# Patient Record
Sex: Female | Born: 1950 | Race: White | Hispanic: No | Marital: Married | Smoking: Never smoker
Health system: Southern US, Community
[De-identification: ages and names within clinical notes are randomized; demographics above are authoritative.]

## PROBLEM LIST (undated history)

## (undated) DIAGNOSIS — M810 Age-related osteoporosis without current pathological fracture: Secondary | ICD-10-CM

## (undated) DIAGNOSIS — R112 Nausea with vomiting, unspecified: Secondary | ICD-10-CM

## (undated) DIAGNOSIS — N6019 Diffuse cystic mastopathy of unspecified breast: Secondary | ICD-10-CM

## (undated) DIAGNOSIS — E785 Hyperlipidemia, unspecified: Secondary | ICD-10-CM

## (undated) DIAGNOSIS — C50919 Malignant neoplasm of unspecified site of unspecified female breast: Secondary | ICD-10-CM

## (undated) DIAGNOSIS — Z9889 Other specified postprocedural states: Secondary | ICD-10-CM

## (undated) DIAGNOSIS — Z923 Personal history of irradiation: Secondary | ICD-10-CM

## (undated) HISTORY — PX: DILATION AND CURETTAGE OF UTERUS: SHX78

## (undated) HISTORY — DX: Diffuse cystic mastopathy of unspecified breast: N60.19

## (undated) HISTORY — PX: COLONOSCOPY: SHX174

## (undated) HISTORY — DX: Hyperlipidemia, unspecified: E78.5

## (undated) HISTORY — DX: Age-related osteoporosis without current pathological fracture: M81.0

---

## 1978-01-29 HISTORY — PX: BREAST CYST ASPIRATION: SHX578

## 1978-01-29 HISTORY — PX: BREAST LUMPECTOMY: SHX2

## 1983-01-30 HISTORY — PX: BREAST EXCISIONAL BIOPSY: SUR124

## 1992-01-30 HISTORY — PX: TUBAL LIGATION: SHX77

## 1997-12-15 ENCOUNTER — Other Ambulatory Visit: Admission: RE | Admit: 1997-12-15 | Discharge: 1997-12-15 | Payer: Self-pay | Admitting: Obstetrics and Gynecology

## 1998-03-02 ENCOUNTER — Other Ambulatory Visit: Admission: RE | Admit: 1998-03-02 | Discharge: 1998-03-02 | Payer: Self-pay | Admitting: Obstetrics and Gynecology

## 1999-02-20 ENCOUNTER — Encounter: Admission: RE | Admit: 1999-02-20 | Discharge: 1999-02-20 | Payer: Self-pay

## 2000-03-12 ENCOUNTER — Encounter: Admission: RE | Admit: 2000-03-12 | Discharge: 2000-03-12 | Payer: Self-pay | Admitting: Obstetrics and Gynecology

## 2000-03-12 ENCOUNTER — Encounter: Payer: Self-pay | Admitting: Obstetrics and Gynecology

## 2000-03-13 ENCOUNTER — Encounter: Admission: RE | Admit: 2000-03-13 | Discharge: 2000-03-13 | Payer: Self-pay

## 2001-02-05 ENCOUNTER — Other Ambulatory Visit: Admission: RE | Admit: 2001-02-05 | Discharge: 2001-02-05 | Payer: Self-pay | Admitting: Obstetrics and Gynecology

## 2001-02-26 ENCOUNTER — Encounter: Payer: Self-pay | Admitting: Obstetrics and Gynecology

## 2001-02-26 ENCOUNTER — Encounter: Admission: RE | Admit: 2001-02-26 | Discharge: 2001-02-26 | Payer: Self-pay | Admitting: Obstetrics and Gynecology

## 2001-03-19 ENCOUNTER — Encounter: Admission: RE | Admit: 2001-03-19 | Discharge: 2001-03-19 | Payer: Self-pay | Admitting: Obstetrics and Gynecology

## 2001-03-19 ENCOUNTER — Encounter: Payer: Self-pay | Admitting: Obstetrics and Gynecology

## 2002-02-16 ENCOUNTER — Other Ambulatory Visit: Admission: RE | Admit: 2002-02-16 | Discharge: 2002-02-16 | Payer: Self-pay | Admitting: Obstetrics and Gynecology

## 2002-02-20 ENCOUNTER — Encounter: Payer: Self-pay | Admitting: Obstetrics and Gynecology

## 2002-02-20 ENCOUNTER — Encounter: Admission: RE | Admit: 2002-02-20 | Discharge: 2002-02-20 | Payer: Self-pay | Admitting: Obstetrics and Gynecology

## 2003-02-17 ENCOUNTER — Other Ambulatory Visit: Admission: RE | Admit: 2003-02-17 | Discharge: 2003-02-17 | Payer: Self-pay | Admitting: Obstetrics and Gynecology

## 2003-02-26 ENCOUNTER — Encounter: Admission: RE | Admit: 2003-02-26 | Discharge: 2003-02-26 | Payer: Self-pay | Admitting: Obstetrics and Gynecology

## 2004-02-29 ENCOUNTER — Other Ambulatory Visit: Admission: RE | Admit: 2004-02-29 | Discharge: 2004-02-29 | Payer: Self-pay | Admitting: Obstetrics and Gynecology

## 2004-03-20 ENCOUNTER — Encounter: Admission: RE | Admit: 2004-03-20 | Discharge: 2004-03-20 | Payer: Self-pay | Admitting: Obstetrics and Gynecology

## 2005-03-01 ENCOUNTER — Other Ambulatory Visit: Admission: RE | Admit: 2005-03-01 | Discharge: 2005-03-01 | Payer: Self-pay | Admitting: Obstetrics and Gynecology

## 2005-03-21 ENCOUNTER — Encounter: Admission: RE | Admit: 2005-03-21 | Discharge: 2005-03-21 | Payer: Self-pay | Admitting: Obstetrics and Gynecology

## 2005-05-01 ENCOUNTER — Ambulatory Visit: Payer: Self-pay | Admitting: Internal Medicine

## 2005-05-07 ENCOUNTER — Ambulatory Visit: Payer: Self-pay | Admitting: Internal Medicine

## 2006-01-24 ENCOUNTER — Ambulatory Visit: Payer: Self-pay | Admitting: Internal Medicine

## 2006-03-04 ENCOUNTER — Other Ambulatory Visit: Admission: RE | Admit: 2006-03-04 | Discharge: 2006-03-04 | Payer: Self-pay | Admitting: Obstetrics and Gynecology

## 2006-03-25 ENCOUNTER — Encounter: Admission: RE | Admit: 2006-03-25 | Discharge: 2006-03-25 | Payer: Self-pay | Admitting: Obstetrics and Gynecology

## 2006-04-11 ENCOUNTER — Encounter: Admission: RE | Admit: 2006-04-11 | Discharge: 2006-04-11 | Payer: Self-pay | Admitting: Obstetrics and Gynecology

## 2007-01-13 ENCOUNTER — Encounter: Payer: Self-pay | Admitting: Internal Medicine

## 2007-03-10 ENCOUNTER — Other Ambulatory Visit: Admission: RE | Admit: 2007-03-10 | Discharge: 2007-03-10 | Payer: Self-pay | Admitting: Obstetrics and Gynecology

## 2007-04-01 ENCOUNTER — Encounter: Admission: RE | Admit: 2007-04-01 | Discharge: 2007-04-01 | Payer: Self-pay | Admitting: Obstetrics and Gynecology

## 2007-11-05 ENCOUNTER — Encounter: Payer: Self-pay | Admitting: Internal Medicine

## 2008-03-11 ENCOUNTER — Other Ambulatory Visit: Admission: RE | Admit: 2008-03-11 | Discharge: 2008-03-11 | Payer: Self-pay | Admitting: Obstetrics and Gynecology

## 2008-04-01 ENCOUNTER — Encounter: Admission: RE | Admit: 2008-04-01 | Discharge: 2008-04-01 | Payer: Self-pay | Admitting: Obstetrics and Gynecology

## 2008-09-22 ENCOUNTER — Ambulatory Visit: Payer: Self-pay | Admitting: Internal Medicine

## 2008-09-22 DIAGNOSIS — E7889 Other lipoprotein metabolism disorders: Secondary | ICD-10-CM

## 2008-09-22 DIAGNOSIS — M810 Age-related osteoporosis without current pathological fracture: Secondary | ICD-10-CM

## 2008-09-23 ENCOUNTER — Telehealth: Payer: Self-pay | Admitting: Internal Medicine

## 2009-03-15 ENCOUNTER — Other Ambulatory Visit: Admission: RE | Admit: 2009-03-15 | Discharge: 2009-03-15 | Payer: Self-pay | Admitting: Obstetrics and Gynecology

## 2009-04-04 ENCOUNTER — Encounter: Admission: RE | Admit: 2009-04-04 | Discharge: 2009-04-04 | Payer: Self-pay | Admitting: Obstetrics and Gynecology

## 2009-12-16 ENCOUNTER — Ambulatory Visit: Payer: Self-pay | Admitting: Internal Medicine

## 2010-02-19 ENCOUNTER — Encounter: Payer: Self-pay | Admitting: Obstetrics and Gynecology

## 2010-02-26 LAB — CONVERTED CEMR LAB
Albumin: 4.4 g/dL (ref 3.5–5.2)
Basophils Relative: 0.1 % (ref 0.0–3.0)
CO2: 32 meq/L (ref 19–32)
Chloride: 104 meq/L (ref 96–112)
Cholesterol: 208 mg/dL — ABNORMAL HIGH (ref 0–200)
Direct LDL: 109.5 mg/dL
Eosinophils Absolute: 0.1 10*3/uL (ref 0.0–0.7)
HCT: 37.1 % (ref 36.0–46.0)
Hemoglobin: 12.8 g/dL (ref 12.0–15.0)
MCHC: 34.5 g/dL (ref 30.0–36.0)
MCV: 101.1 fL — ABNORMAL HIGH (ref 78.0–100.0)
Monocytes Absolute: 0.4 10*3/uL (ref 0.1–1.0)
Neutro Abs: 2.8 10*3/uL (ref 1.4–7.7)
RBC: 3.67 M/uL — ABNORMAL LOW (ref 3.87–5.11)
Sodium: 140 meq/L (ref 135–145)
Total CHOL/HDL Ratio: 3
Total Protein: 7.3 g/dL (ref 6.0–8.3)

## 2010-02-28 NOTE — Assessment & Plan Note (Signed)
Summary: cough/cbs   Vital Signs:  Patient profile:   60 year old female Height:      65 inches (165.10 cm) Weight:      137.38 pounds (62.45 kg) BMI:     22.94 O2 Sat:      99 % on Room air Temp:     98.9 degrees F (37.17 degrees C) oral Pulse rate:   71 / minute Resp:     15 per minute BP sitting:   120 / 84  (left arm) Cuff size:   regular  Vitals Entered By: Lucious Groves CMA (December 16, 2009 12:15 PM)  O2 Flow:  Room air CC: C/O dry cough x2 weeks and recent ear pain./kb, URI symptoms Is Patient Diabetic? No Pain Assessment Patient in pain? yes     Location: left ear Intensity: 5 Type: sharp Onset of pain  yesterday Comments Patient notes that she has a little head congestion, but denies fever, HA, SOB, mucous production, and sinus pressure./kb   CC:  C/O dry cough x2 weeks and recent ear pain./kb and URI symptoms.  History of Present Illness: RTI  Symptoms      This is a 60 year old woman who presents with RTI  symptoms; onset as dry cough 2 weeks ago.  The patient now  reports dry cough and  L earache, but denies nasal congestion and sore throat.  The patient denies fever, dyspnea, and wheezing.  The patient denies itchy watery eyes, sneezing, and headache.  The patient denies the following risk factors for Strep sinusitis: unilateral facial pain, tooth pain, and tender adenopathy.  Rx: cough med from work nurse  Current Medications (verified): 1)  Boniva 150 Mg Tabs (Ibandronate Sodium) .Marland Kitchen.. 1 By Mouth Monthly 2)  Oxybutynin Chloride 5 Mg Tabs (Oxybutynin Chloride) .Marland Kitchen.. 1 By Mouth Once Daily 3)  Vitamin D3 1000 Unit Caps (Cholecalciferol) .Marland Kitchen.. 1 By Mouth Once Daily 4)  Vitamin C 500 Mg Tabs (Ascorbic Acid) .Marland Kitchen.. 1 By Mouth Once Daily 5)  Multivitamins  Tabs (Multiple Vitamin) .Marland Kitchen.. 1 By Mouth Once Daily 6)  Calcium 500 Mg Tabs (Calcium Carbonate) .Marland Kitchen.. 1 By Mouth Two Times A Day  Allergies (verified): No Known Drug Allergies  Physical Exam  General:   well-nourished,in no acute distress; alert,appropriate and cooperative throughout examination Ears:  External ear exam shows no significant lesions or deformities.  Otoscopic examination reveals  wax impactions , R > L. After soaking & gavage large fragments of dark wax eliminated . With last gavage acute pain with serosanguinous material expelled. pain resolves within 2 minutes . Slight pooling of liquid . Whisper heard @ 5 ft. Asymmetry of canals noted ; L "cat eye" shaped , R essentially oval. Nose:  External nasal examination shows no deformity or inflammation. Nasal mucosa are pink and moist without lesions or exudates. Mouth:  Oral mucosa and oropharynx without lesions or exudates.  Teeth in good repair. Lungs:  Normal respiratory effort, chest expands symmetrically. Lungs are clear to auscultation, no crackles or wheezes. Dry cough Cervical Nodes:  No lymphadenopathy noted Axillary Nodes:  No palpable lymphadenopathy   Impression & Recommendations:  Problem # 1:  BRONCHITIS-ACUTE (ICD-466.0)  Her updated medication list for this problem includes:    Azithromycin 250 Mg Tabs (Azithromycin) .Marland Kitchen... As per pack  Problem # 2:  CERUMEN IMPACTION (ICD-380.4)  Complete Medication List: 1)  Boniva 150 Mg Tabs (Ibandronate sodium) .Marland Kitchen.. 1 by mouth monthly 2)  Oxybutynin Chloride 5 Mg Tabs (Oxybutynin chloride) .Marland KitchenMarland KitchenMarland Kitchen  1 by mouth once daily 3)  Vitamin D3 1000 Unit Caps (Cholecalciferol) .Marland Kitchen.. 1 by mouth once daily 4)  Vitamin C 500 Mg Tabs (Ascorbic acid) .Marland Kitchen.. 1 by mouth once daily 5)  Multivitamins Tabs (Multiple vitamin) .Marland Kitchen.. 1 by mouth once daily 6)  Calcium 500 Mg Tabs (Calcium carbonate) .Marland Kitchen.. 1 by mouth two times a day 7)  Azithromycin 250 Mg Tabs (Azithromycin) .... As per pack  Patient Instructions: 1)  Ear Hygiene as discussed. Ear drops to l ear  as Rxed Prescriptions: AZITHROMYCIN 250 MG TABS (AZITHROMYCIN) as per pack  #1 x 0   Entered and Authorized by:   Marga Melnick MD   Signed  by:   Marga Melnick MD on 12/16/2009   Method used:   Faxed to ...       [facility] (retail)       [address]       [place], [place]  [address]       Ph: [phone]       Fax: [phone]   RxID:   [id number]    Orders Added: 1)  New Patient Level III [99203]   Immunization History:  Influenza Immunization History:    Influenza:  historical (10/29/2009)   Immunization History:  Influenza Immunization History:    Influenza:  Historical (10/29/2009)

## 2010-03-14 ENCOUNTER — Other Ambulatory Visit: Payer: Self-pay | Admitting: Internal Medicine

## 2010-03-14 DIAGNOSIS — Z1231 Encounter for screening mammogram for malignant neoplasm of breast: Secondary | ICD-10-CM

## 2010-04-10 ENCOUNTER — Ambulatory Visit: Payer: Self-pay

## 2010-04-17 ENCOUNTER — Ambulatory Visit
Admission: RE | Admit: 2010-04-17 | Discharge: 2010-04-17 | Disposition: A | Discharge status: Home / Self Care | Payer: BC Managed Care – PPO | Source: Ambulatory Visit | Attending: Internal Medicine | Admitting: Internal Medicine

## 2010-04-17 DIAGNOSIS — Z1231 Encounter for screening mammogram for malignant neoplasm of breast: Secondary | ICD-10-CM

## 2010-04-20 ENCOUNTER — Other Ambulatory Visit: Payer: Self-pay | Admitting: Internal Medicine

## 2010-04-20 DIAGNOSIS — R928 Other abnormal and inconclusive findings on diagnostic imaging of breast: Secondary | ICD-10-CM

## 2010-04-26 ENCOUNTER — Ambulatory Visit
Admission: RE | Admit: 2010-04-26 | Discharge: 2010-04-26 | Disposition: A | Discharge status: Home / Self Care | Payer: BC Managed Care – PPO | Source: Ambulatory Visit | Attending: Internal Medicine | Admitting: Internal Medicine

## 2010-04-26 DIAGNOSIS — R928 Other abnormal and inconclusive findings on diagnostic imaging of breast: Secondary | ICD-10-CM

## 2011-04-02 ENCOUNTER — Other Ambulatory Visit: Payer: Self-pay | Admitting: Obstetrics and Gynecology

## 2011-04-02 DIAGNOSIS — Z1231 Encounter for screening mammogram for malignant neoplasm of breast: Secondary | ICD-10-CM

## 2011-04-23 ENCOUNTER — Ambulatory Visit
Admission: RE | Admit: 2011-04-23 | Discharge: 2011-04-23 | Disposition: A | Discharge status: Home / Self Care | Payer: BC Managed Care – PPO | Source: Ambulatory Visit | Attending: Obstetrics and Gynecology | Admitting: Obstetrics and Gynecology

## 2011-04-23 DIAGNOSIS — Z1231 Encounter for screening mammogram for malignant neoplasm of breast: Secondary | ICD-10-CM

## 2011-10-17 ENCOUNTER — Ambulatory Visit (INDEPENDENT_AMBULATORY_CARE_PROVIDER_SITE_OTHER): Payer: BC Managed Care – PPO | Admitting: Internal Medicine

## 2011-10-17 ENCOUNTER — Encounter: Payer: Self-pay | Admitting: Internal Medicine

## 2011-10-17 VITALS — BP 118/76 | HR 63 | Temp 98.3°F | Resp 14 | Ht 65.03 in | Wt 127.0 lb

## 2011-10-17 DIAGNOSIS — Z1211 Encounter for screening for malignant neoplasm of colon: Secondary | ICD-10-CM

## 2011-10-17 DIAGNOSIS — Z0000 Encounter for general adult medical examination without abnormal findings: Secondary | ICD-10-CM

## 2011-10-17 DIAGNOSIS — M810 Age-related osteoporosis without current pathological fracture: Secondary | ICD-10-CM

## 2011-10-17 DIAGNOSIS — E785 Hyperlipidemia, unspecified: Secondary | ICD-10-CM

## 2011-10-17 DIAGNOSIS — Z23 Encounter for immunization: Secondary | ICD-10-CM

## 2011-10-17 NOTE — Patient Instructions (Addendum)
Preventive Health Care: Exercise  30-45  minutes a day, 3-4 days a week. Walking is especially valuable in preventing Osteoporosis. Eat a low-fat diet with lots of fruits and vegetables, up to 7-9 servings per day.  Consume less than 30 grams of sugar per day from foods & drinks with High Fructose Corn Syrup as #1,2,3 or #4 on label. As per the Standard of Care , screening Colonoscopy recommended @ 50 & every 5-10 years thereafter . More frequent monitor would be dictated by family history or findings @ Colonoscopy  Health Care Power of Attorney & Living Will place you in charge of your health care  decisions. Verify these are  in place. Please  schedule fasting Labs : BMET,Lipids, hepatic panel, CBC & dif, TSH, vit D3 level.PLEASE BRING THESE INSTRUCTIONS TO FOLLOW UP  LAB APPOINTMENT.This will guarantee correct labs are drawn, eliminating need for repeat blood sampling ( needle sticks ! ). Diagnoses /Codes: V70.0.  If you activate My Chart; the results can be released to you as soon as they populate from the lab. If you choose not to use this program; the labs have to be reviewed, copied & mailed   causing a delay in getting the results to you.

## 2011-10-17 NOTE — Progress Notes (Signed)
  Subjective:    Patient ID: Sherry Wells, female    DOB: Oct 07, 1950, 61 y.o.   MRN: [id number]  HPI  Sherry Wells is here for a physical; she denies acute issues.      Review of Systems As noted she is asymptomatic. She is on no specific diet; cardiovascular exercise is irregular.  She has stopped her  bisphosphonates after at least 5 years of oral agents. Bone mineral density will be due in November of this year.  She is overdue for followup colonoscopic surveillance; she denies abdominal pain, unexplained weight loss, melena, rectal bleeding.     Objective:   Physical Exam Gen.: Thin but healthy and well-nourished in appearance. Alert, appropriate and cooperative throughout exam. Head: Normocephalic without obvious abnormalities Eyes: No corneal or conjunctival inflammation noted. Pupils equal round reactive to light and accommodation. Fundal exam is benign without hemorrhages, exudate, papilledema. Extraocular motion intact. Vision grossly normal. Ears: External  ear exam reveals no significant lesions or deformities. Canals clear .TMs normal. Hearing is grossly normal bilaterally. Nose: External nasal exam reveals no deformity or inflammation. Nasal mucosa are pink and moist. No lesions or exudates noted.  Mouth: Oral mucosa and oropharynx reveal no lesions or exudates. Teeth in good repair. Neck: No deformities, masses, or tenderness noted. Range of motion & Thyroid normal. Lungs: Normal respiratory effort; chest expands symmetrically. Lungs are clear to auscultation without rales, wheezes, or increased work of breathing. Heart: Normal rate and rhythm. Normal S1 and S2. No gallop, click, or rub. S4 w/o murmur. Abdomen: Bowel sounds normal; abdomen soft and nontender. No masses, organomegaly or hernias noted. Genitalia: Dr Jackelyn Knife Musculoskeletal/extremities: No deformity or scoliosis noted of  the thoracic or lumbar spine. No clubbing, cyanosis, edema, or deformity noted. Range  of motion  normal .Tone & strength  normal.Joints normal. Nail health  good. Vascular: Carotid, radial artery, dorsalis pedis and  posterior tibial pulses are full and equal. No bruits present. Neurologic: Alert and oriented x3. Deep tendon reflexes symmetrical and normal.          Skin: Intact without suspicious lesions or rashes.Multiple nevi Lymph: No cervical, axillary lymphadenopathy present. Psych: Mood and affect are normal. Normally interactive                                                                                       Assessment & Plan:  #1 comprehensive physical exam; no acute findings #2 see Problem List with Assessments & Recommendations Plan: see Orders

## 2011-10-19 ENCOUNTER — Other Ambulatory Visit (INDEPENDENT_AMBULATORY_CARE_PROVIDER_SITE_OTHER): Payer: BC Managed Care – PPO

## 2011-10-19 ENCOUNTER — Other Ambulatory Visit: Payer: Self-pay | Admitting: Internal Medicine

## 2011-10-19 DIAGNOSIS — Z0000 Encounter for general adult medical examination without abnormal findings: Secondary | ICD-10-CM

## 2011-10-19 LAB — BASIC METABOLIC PANEL
BUN: 12 mg/dL (ref 6–23)
Chloride: 104 mEq/L (ref 96–112)
Creatinine, Ser: 0.8 mg/dL (ref 0.4–1.2)
Glucose, Bld: 92 mg/dL (ref 70–99)

## 2011-10-19 LAB — LIPID PANEL
Cholesterol: 189 mg/dL (ref 0–200)
LDL Cholesterol: 95 mg/dL (ref 0–99)

## 2011-10-19 LAB — CBC WITH DIFFERENTIAL/PLATELET
Basophils Absolute: 0 10*3/uL (ref 0.0–0.1)
Eosinophils Absolute: 0.1 10*3/uL (ref 0.0–0.7)
Eosinophils Relative: 1.4 % (ref 0.0–5.0)
MCHC: 33.4 g/dL (ref 30.0–36.0)
MCV: 101 fl — ABNORMAL HIGH (ref 78.0–100.0)
Monocytes Absolute: 0.3 10*3/uL (ref 0.1–1.0)
Neutrophils Relative %: 63.8 % (ref 43.0–77.0)
Platelets: 179 10*3/uL (ref 150.0–400.0)
RDW: 13.4 % (ref 11.5–14.6)
WBC: 5.6 10*3/uL (ref 4.5–10.5)

## 2011-10-19 LAB — TSH: TSH: 1.62 u[IU]/mL (ref 0.35–5.50)

## 2011-10-19 LAB — HEPATIC FUNCTION PANEL
Alkaline Phosphatase: 57 U/L (ref 39–117)
Bilirubin, Direct: 0.2 mg/dL (ref 0.0–0.3)
Total Protein: 6.9 g/dL (ref 6.0–8.3)

## 2011-10-20 LAB — VITAMIN D 25 HYDROXY (VIT D DEFICIENCY, FRACTURES): Vit D, 25-Hydroxy: 60 ng/mL (ref 30–89)

## 2011-10-23 ENCOUNTER — Encounter: Payer: Self-pay | Admitting: Internal Medicine

## 2011-10-26 ENCOUNTER — Encounter: Payer: Self-pay | Admitting: Internal Medicine

## 2011-11-08 ENCOUNTER — Encounter: Payer: Self-pay | Admitting: Internal Medicine

## 2011-11-08 ENCOUNTER — Ambulatory Visit (AMBULATORY_SURGERY_CENTER): Payer: BC Managed Care – PPO | Admitting: *Deleted

## 2011-11-08 VITALS — Ht 65.0 in | Wt 128.0 lb

## 2011-11-08 DIAGNOSIS — Z1211 Encounter for screening for malignant neoplasm of colon: Secondary | ICD-10-CM

## 2011-11-08 MED ORDER — SUPREP BOWEL PREP KIT 17.5-3.13-1.6 GM/177ML PO SOLN: ORAL | Status: DC

## 2011-11-22 ENCOUNTER — Ambulatory Visit (AMBULATORY_SURGERY_CENTER): Payer: BC Managed Care – PPO | Admitting: Internal Medicine

## 2011-11-22 ENCOUNTER — Encounter: Payer: Self-pay | Admitting: Internal Medicine

## 2011-11-22 VITALS — BP 140/82 | HR 53 | Temp 97.8°F | Resp 17 | Ht 65.0 in | Wt 128.0 lb

## 2011-11-22 DIAGNOSIS — Z1211 Encounter for screening for malignant neoplasm of colon: Secondary | ICD-10-CM

## 2011-11-22 DIAGNOSIS — D126 Benign neoplasm of colon, unspecified: Secondary | ICD-10-CM

## 2011-11-22 MED ORDER — SODIUM CHLORIDE 0.9 % IV SOLN: 500.0000 mL | INTRAVENOUS | Status: DC

## 2011-11-22 NOTE — Patient Instructions (Signed)
YOU HAD AN ENDOSCOPIC PROCEDURE TODAY AT THE [facility]: Refer to the procedure report that was given to you for any specific questions about what was found during the examination.  If the procedure report does not answer your questions, please call your gastroenterologist to clarify.  If you requested that your care partner not be given the details of your procedure findings, then the procedure report has been included in a sealed envelope for you to review at your convenience later.  YOU SHOULD EXPECT: Some feelings of bloating in the abdomen. Passage of more gas than usual.  Walking can help get rid of the air that was put into your GI tract during the procedure and reduce the bloating. If you had a lower endoscopy (such as a colonoscopy or flexible sigmoidoscopy) you may notice spotting of blood in your stool or on the toilet paper. If you underwent a bowel prep for your procedure, then you may not have a normal bowel movement for a few days.  DIET: Your first meal following the procedure should be a light meal and then it is ok to progress to your normal diet.  A half-sandwich or bowl of soup is an example of a good first meal.  Heavy or fried foods are harder to digest and may make you feel nauseous or bloated.  Likewise meals heavy in dairy and vegetables can cause extra gas to form and this can also increase the bloating.  Drink plenty of fluids but you should avoid alcoholic beverages for 24 hours.  ACTIVITY: Your care partner should take you home directly after the procedure.  You should plan to take it easy, moving slowly for the rest of the day.  You can resume normal activity the day after the procedure however you should NOT DRIVE or use heavy machinery for 24 hours (because of the sedation medicines used during the test).    SYMPTOMS TO REPORT IMMEDIATELY: A gastroenterologist can be reached at any hour.  During normal business hours, 8:30 AM to 5:00 PM Monday through Friday,  call [phone].  After hours and on weekends, please call the GI answering service at [phone] who will take a message and have the physician on call contact you.   Following lower endoscopy (colonoscopy or flexible sigmoidoscopy):  Excessive amounts of blood in the stool  Significant tenderness or worsening of abdominal pains  Swelling of the abdomen that is new, acute  Fever of 100F or higher    FOLLOW UP: If any biopsies were taken you will be contacted by phone or by letter within the next 1-3 weeks.  Call your gastroenterologist if you have not heard about the biopsies in 3 weeks.  Our staff will call the home number listed on your records the next business day following your procedure to check on you and address any questions or concerns that you may have at that time regarding the information given to you following your procedure. This is a courtesy call and so if there is no answer at the home number and we have not heard from you through the emergency physician on call, we will assume that you have returned to your regular daily activities without incident.  SIGNATURES/CONFIDENTIALITY: You and/or your care partner have signed paperwork which will be entered into your electronic medical record.  These signatures attest to the fact that that the information above on your After Visit Summary has been reviewed and is understood.  Full responsibility of the confidentiality   of this discharge information lies with you and/or your care-partner.     

## 2011-11-22 NOTE — Progress Notes (Signed)
Patient did not experience any of the following events: a burn prior to discharge; a fall within the facility; wrong site/side/patient/procedure/implant event; or a hospital transfer or hospital admission upon discharge from the facility. (G8907) Patient did not have preoperative order for IV antibiotic SSI prophylaxis. (G8918)  

## 2011-11-22 NOTE — Op Note (Signed)
[facility] [address] [place], [address]   COLONOSCOPY PROCEDURE REPORT  PATIENT: Sherry, Wells  MR#: [id number] BIRTHDATE: [birth year] , 61  yrs. old GENDER: Female ENDOSCOPIST: Beverley Fiedler, MD REFERRED XB:[id number], GI PROCEDURE DATE:  11/22/2011 PROCEDURE:   Colonoscopy with biopsy ASA CLASS:   Class II INDICATIONS:average risk screening and last colonoscopy performed 10 years ago. MEDICATIONS: MAC sedation, administered by CRNA and Propofol (Diprivan) 230 mg IV  DESCRIPTION OF PROCEDURE:   After the risks benefits and alternatives of the procedure were thoroughly explained, informed consent was obtained.  A digital rectal exam revealed no rectal mass.   The LB [id number]-[id number]  endoscope was introduced through the anus and advanced to the terminal ileum which was intubated for a short distance. No adverse events experienced.   The quality of the prep was Suprep good  The instrument was then slowly withdrawn as the colon was fully examined.  COLON FINDINGS: The mucosa appeared normal in the terminal ileum. A possible flat polyp measuring 10 mm in size was found in the ascending colon.  Multiple biopsies of the area were performed using cold forceps to rule out adenomatous change.  A pill was found in the cecum.   Mild diverticulosis was noted in the sigmoid colon.  Otherwise normal examination.  Retroflexed views revealed no abnormalities. The time to cecum=4 minutes 43 seconds. Withdrawal time=11 minutes 30 seconds.  The scope was withdrawn and the procedure completed. COMPLICATIONS: There were no complications.  ENDOSCOPIC IMPRESSION: 1.   Normal mucosa in the terminal ileum 2.   Possible flat polyp was found in the ascending colon; multiple biopsies of the area were performed using cold forceps 3.   Mild diverticulosis was noted in the sigmoid colon  RECOMMENDATIONS: 1.  Await pathology results 2.  Timing of repeat colonoscopy will be  determined by pathology findings.  If no polyp tissue is found in the ascending colon, then repeat interval would be 10 years (assuming average risk) 3.  You will receive a letter within 1-2 weeks with the results of your biopsy as well as final recommendations.  Please call my office if you have not received a letter after 3 weeks.   eSigned:  Beverley Fiedler, MD 11/22/2011 9:09 AM   cc: Pecola Lawless, MD and The Patient

## 2011-11-23 ENCOUNTER — Telehealth: Payer: Self-pay

## 2011-11-23 NOTE — Telephone Encounter (Signed)
Left message on answering machine. 

## 2011-11-26 ENCOUNTER — Encounter: Payer: Self-pay | Admitting: Internal Medicine

## 2011-11-27 ENCOUNTER — Other Ambulatory Visit: Payer: Self-pay | Admitting: Internal Medicine

## 2011-11-27 DIAGNOSIS — M810 Age-related osteoporosis without current pathological fracture: Secondary | ICD-10-CM

## 2011-11-28 ENCOUNTER — Encounter: Payer: Self-pay | Admitting: Internal Medicine

## 2011-12-17 ENCOUNTER — Ambulatory Visit (INDEPENDENT_AMBULATORY_CARE_PROVIDER_SITE_OTHER)
Admission: RE | Admit: 2011-12-17 | Discharge: 2011-12-17 | Disposition: A | Discharge status: Home / Self Care | Payer: BC Managed Care – PPO | Source: Ambulatory Visit

## 2011-12-17 DIAGNOSIS — M810 Age-related osteoporosis without current pathological fracture: Secondary | ICD-10-CM

## 2012-03-25 ENCOUNTER — Other Ambulatory Visit: Payer: Self-pay | Admitting: Obstetrics and Gynecology

## 2012-03-25 DIAGNOSIS — Z1231 Encounter for screening mammogram for malignant neoplasm of breast: Secondary | ICD-10-CM

## 2012-04-28 ENCOUNTER — Ambulatory Visit: Payer: BC Managed Care – PPO

## 2012-05-12 ENCOUNTER — Ambulatory Visit
Admission: RE | Admit: 2012-05-12 | Discharge: 2012-05-12 | Disposition: A | Discharge status: Home / Self Care | Payer: BC Managed Care – PPO | Source: Ambulatory Visit | Attending: Obstetrics and Gynecology | Admitting: Obstetrics and Gynecology

## 2012-05-12 DIAGNOSIS — Z1231 Encounter for screening mammogram for malignant neoplasm of breast: Secondary | ICD-10-CM

## 2012-11-21 ENCOUNTER — Encounter: Payer: Self-pay | Admitting: Internal Medicine

## 2013-01-29 HISTORY — PX: BREAST BIOPSY: SHX20

## 2013-04-20 ENCOUNTER — Other Ambulatory Visit: Payer: Self-pay

## 2013-04-20 DIAGNOSIS — Z1231 Encounter for screening mammogram for malignant neoplasm of breast: Secondary | ICD-10-CM

## 2013-05-18 ENCOUNTER — Encounter: Payer: Self-pay | Admitting: Internal Medicine

## 2013-05-25 ENCOUNTER — Ambulatory Visit
Admission: RE | Admit: 2013-05-25 | Discharge: 2013-05-25 | Disposition: A | Discharge status: Home / Self Care | Payer: BC Managed Care – PPO | Source: Ambulatory Visit

## 2013-05-25 DIAGNOSIS — Z1231 Encounter for screening mammogram for malignant neoplasm of breast: Secondary | ICD-10-CM

## 2013-05-26 ENCOUNTER — Encounter: Payer: Self-pay | Admitting: Internal Medicine

## 2013-05-28 ENCOUNTER — Other Ambulatory Visit: Payer: Self-pay | Admitting: Obstetrics and Gynecology

## 2013-05-28 DIAGNOSIS — R928 Other abnormal and inconclusive findings on diagnostic imaging of breast: Secondary | ICD-10-CM

## 2013-06-08 ENCOUNTER — Other Ambulatory Visit: Payer: Self-pay | Admitting: Obstetrics and Gynecology

## 2013-06-08 ENCOUNTER — Ambulatory Visit
Admission: RE | Admit: 2013-06-08 | Discharge: 2013-06-08 | Disposition: A | Discharge status: Home / Self Care | Payer: BC Managed Care – PPO | Source: Ambulatory Visit | Attending: Obstetrics and Gynecology | Admitting: Obstetrics and Gynecology

## 2013-06-08 DIAGNOSIS — R928 Other abnormal and inconclusive findings on diagnostic imaging of breast: Secondary | ICD-10-CM

## 2013-06-11 ENCOUNTER — Ambulatory Visit
Admission: RE | Admit: 2013-06-11 | Discharge: 2013-06-11 | Disposition: A | Discharge status: Home / Self Care | Payer: BC Managed Care – PPO | Source: Ambulatory Visit | Attending: Obstetrics and Gynecology | Admitting: Obstetrics and Gynecology

## 2013-06-11 DIAGNOSIS — R928 Other abnormal and inconclusive findings on diagnostic imaging of breast: Secondary | ICD-10-CM

## 2013-06-12 ENCOUNTER — Other Ambulatory Visit: Payer: Self-pay | Admitting: Obstetrics and Gynecology

## 2013-06-12 DIAGNOSIS — C50919 Malignant neoplasm of unspecified site of unspecified female breast: Secondary | ICD-10-CM

## 2013-06-13 ENCOUNTER — Encounter: Payer: Self-pay | Admitting: Internal Medicine

## 2013-06-13 DIAGNOSIS — D0590 Unspecified type of carcinoma in situ of unspecified breast: Secondary | ICD-10-CM | POA: Insufficient documentation

## 2013-06-19 ENCOUNTER — Ambulatory Visit
Admission: RE | Admit: 2013-06-19 | Discharge: 2013-06-19 | Disposition: A | Discharge status: Home / Self Care | Payer: BC Managed Care – PPO | Source: Ambulatory Visit | Attending: Obstetrics and Gynecology | Admitting: Obstetrics and Gynecology

## 2013-06-19 ENCOUNTER — Encounter (INDEPENDENT_AMBULATORY_CARE_PROVIDER_SITE_OTHER): Payer: Self-pay | Admitting: General Surgery

## 2013-06-19 ENCOUNTER — Ambulatory Visit (INDEPENDENT_AMBULATORY_CARE_PROVIDER_SITE_OTHER): Payer: BC Managed Care – PPO | Admitting: General Surgery

## 2013-06-19 VITALS — BP 140/80 | HR 74 | Temp 97.8°F | Resp 16 | Ht 65.0 in | Wt 129.4 lb

## 2013-06-19 DIAGNOSIS — D0590 Unspecified type of carcinoma in situ of unspecified breast: Secondary | ICD-10-CM

## 2013-06-19 DIAGNOSIS — C50919 Malignant neoplasm of unspecified site of unspecified female breast: Secondary | ICD-10-CM

## 2013-06-19 MED ORDER — GADOBENATE DIMEGLUMINE 529 MG/ML IV SOLN
12.0000 mL | Freq: Once | INTRAVENOUS | Status: AC | PRN
  Administered 2013-06-19: 12 mL via INTRAVENOUS

## 2013-06-19 NOTE — Progress Notes (Addendum)
Patient ID: Sherry Wells, female   DOB: 22-Jul-1950, 63 y.o.   MRN: [id number]  Chief Complaint  Patient presents with  . New Evaluation    New breast cancer eval LT Br Ca    Note: This dictation was prepared with Dragon/digital dictation along with [employer]. Any transcriptional errors that result from this process are unintentional.   HPI Sherry Wells is a 63 y.o. female.  She is referred by Dr. Nolon Nations at the breast center of [facility] for her newly diagnosed ductal carcinoma in situ left breast, upper outer quadrant. Hormone receptors pending. Dr. Unice Cobble is her PCP. Dr. Willis Modena is her GYN. Her husband is with her throughout the encounter today.  She had a left breast biopsy for benign cyst by Dr. Leafy Kindle many years ago, otherwise no breast problems. She gets annual mammography. Recent screening mammogram show a 10 mm area of pleomorphic calcifications in the deep upper outer quadrant of the left breast. There wasn't much of a mass effect with the breasts are dense. Image guided biopsy showed ductal carcinoma in situ, ER and PR pending.  MRI shows 2.1 cm enhancement, consistent with cancer deep in the upper outer quadrant left breast. There were some cysts. Otherwise no abnormal findings.  Comorbidities are minimal. She is healthy. 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 Her husband is with her.  Family history reveals breast cancer in a first cousin otherwise negative. HPI  Past Medical History  Diagnosis Date  . Fibrocystic breast disease   . Osteoporosis   . Hyperlipidemia     Past Surgical History  Procedure Laterality Date  . Tubal ligation  1994  . Colonoscopy  2002    negative; [facility]  . Breast lumpectomy  1980    cyst  . Breast cyst aspiration      Family History  Problem Relation Age of Onset  . Diabetes Mother   . Transient ischemic attack Mother   . Hypertension Mother   . Heart  disease Father     ? MI  . Valvular heart disease Father     Aortic valve  . Throat cancer Maternal Uncle   . Cancer Maternal Uncle     ? primary  . Osteoporosis Sister   . Valvular heart disease Paternal Uncle     Aortic valve  . Cardiomyopathy Sister     Social History History  Substance Use Topics  . Smoking status: Never Smoker   . Smokeless tobacco: Never Used  . Alcohol Use: No    No Known Allergies  Current Outpatient Prescriptions  Medication Sig Dispense Refill  . Calcium Carbonate-Vitamin D (CALTRATE 600+D) 600-400 MG-UNIT per tablet Take 1 tablet by mouth 2 (two) times daily.      . cholecalciferol (VITAMIN D) 400 UNITS TABS Take 400 Units by mouth daily.      . minocycline (MINOCIN,DYNACIN) 100 MG capsule Take 100 mg by mouth daily as needed. Rosacea      . Multiple Vitamins-Minerals (MULTIVITAMIN WITH MINERALS) tablet Take 1 tablet by mouth daily.      Marland Kitchen oxybutynin (DITROPAN-XL) 10 MG 24 hr tablet Take 10 mg by mouth daily.      . vitamin C (ASCORBIC ACID) 500 MG tablet Take 500 mg by mouth daily.       No current facility-administered medications for this visit.    Review of Systems Review of Systems  Constitutional: Negative for fever, chills and unexpected  weight change.  HENT: Negative for congestion, hearing loss, sore throat, trouble swallowing and voice change.   Eyes: Negative for visual disturbance.  Respiratory: Negative for cough and wheezing.   Cardiovascular: Negative for chest pain, palpitations and leg swelling.  Gastrointestinal: Negative for nausea, vomiting, abdominal pain, diarrhea, constipation, blood in stool, abdominal distention and anal bleeding.  Genitourinary: Negative for hematuria, vaginal bleeding and difficulty urinating.  Musculoskeletal: Negative for arthralgias.  Skin: Negative for rash and wound.  Neurological: Negative for seizures, syncope and headaches.  Hematological: Negative for adenopathy. Does not bruise/bleed  easily.  Psychiatric/Behavioral: Negative for confusion.    Blood pressure 140/80, pulse 74, temperature 97.8 F (36.6 C), temperature source Temporal, resp. rate 16, height 5\' 5"  (1.651 m), weight 129 lb 6.4 oz (58.695 kg).  Physical Exam Physical Exam  Constitutional: She is oriented to person, place, and time. She appears well-developed and well-nourished. No distress.  HENT:  Head: Normocephalic and atraumatic.  Nose: Nose normal.  Mouth/Throat: No oropharyngeal exudate.  Eyes: Conjunctivae and EOM are normal. Pupils are equal, round, and reactive to light. Left eye exhibits no discharge. No scleral icterus.  Neck: Neck supple. No JVD present. No tracheal deviation present. No thyromegaly present.  Cardiovascular: Normal rate, regular rhythm, normal heart sounds and intact distal pulses.   No murmur heard. Pulmonary/Chest: Effort normal and breath sounds normal. No respiratory distress. She has no wheezes. She has no rales. She exhibits no tenderness.  Breasts are relatively small. 34B bra size by history. There is a biopsy site and a tiny hematoma under the biopsy site at the 3:00 position. Otherwise no palpable mass or skin change. Well-healed transverse scar at 12:00. No axillary adenopathy on either side.  Abdominal: Soft. Bowel sounds are normal. She exhibits no distension and no mass. There is no tenderness. There is no rebound and no guarding.  Musculoskeletal: She exhibits no edema and no tenderness.  Lymphadenopathy:    She has no cervical adenopathy.  Neurological: She is alert and oriented to person, place, and time. She exhibits normal muscle tone. Coordination normal.  Skin: Skin is warm. No rash noted. She is not diaphoretic. No erythema. No pallor.  Psychiatric: She has a normal mood and affect. Her behavior is normal. Judgment and thought content normal.    Data Reviewed I reviewed all of her imaging studies and her histology report.  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 10 mm by mammography. 20 mm by MRI. Receptor assay pending.  No significant comorbidities other than mild osteopenia     Plan    We spent a long time talking about treatment of breast cancer in general and specific details of her individual situation. We talked about lumpectomy, radiation therapy, and sentinel lymph node biopsy since the tumor is in the upper outer quadrant and within a few with future mapping. We talked about mastectomy with or without reconstruction. I gave her information booklets and educational information to take home. I spent the better part of an hour with education. She was offered preoperative consultation with medical oncology and radiation oncology.   At the end of the conversation she decided to go ahead with left partial mastectomy with radioactive seed localization and left axillary sentinel node biopsy and referral to medical oncology and radiation oncology postop. She says she is willing to undergo radiation therapy.   She is aware that her breasts are relatively small, and although we  will make all efforts to transfer tissue and reconstruct that there may be a cosmetic defect. She is accepting of this. She is also aware that if there is a possibility of invasive cancer with more extensive treatment that I have outlined.  I discussed the indications, details, techniques and numerous risks of partial mastectomy and sentinel node biopsy. She is aware of the risk of bleeding, infection, reoperation for positive margins were positive nodes, cosmetic deformity, and other unforeseen problems. She understands these issues well. At this time all of her questions are ensured. She agrees with this plan.       Edsel Petrin. Dalbert Batman, M.D., [facility] General and Minimally invasive Surgery Breast and Colorectal Surgery Office:   [phone] Pager:   [phone]  06/19/2013,  5:50 PM

## 2013-06-19 NOTE — Patient Instructions (Signed)
You have been diagnosed with an early breast cancer in the lateral aspect of your left breast. 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  We have talked about lots of possibilities. We have talked about lumpectomy and sentinel node biopsy and radiation therapy. We have talked about mastectomy with and without reconstruction. We have talked about preoperative referral to medical oncology and radiation oncology, or making those referrals postop.  You have decided to proceed with scheduling of a left partial mastectomy with radioactive seed localization and left axillary sentinel node biopsy.  You will be referred to medical oncology and radiation oncology postoperatively.     Lumpectomy A lumpectomy is a form of "breast conserving" or "breast preservation" surgery. It may also be referred to as a partial mastectomy. During a lumpectomy, the portion of the breast that contains the cancerous tumor or breast mass (the lump) is removed. Some normal tissue around the lump may also be removed to make sure all the tumor has been removed. This surgery should take 40 minutes or less. LET [facility] CARE PROVIDER KNOW ABOUT:  Any allergies you have.  All medicines you are taking, including vitamins, herbs, eye drops, creams, and over-the-counter medicines.  Previous problems you or members of your family have had with the use of anesthetics.  Any blood disorders you have.  Previous surgeries you have had.  Medical conditions you have. RISKS AND COMPLICATIONS Generally, this is a safe procedure. However, as with any procedure, complications can occur. Possible complications include:  Bleeding.  Infection.  Pain.  Temporary swelling.  Change in the shape of the breast, particularly if a large portion is removed. BEFORE THE PROCEDURE  Ask your health care provider about changing or stopping your regular medicines.  Do not eat or drink anything for 7 8 hours before the  surgery or as directed by your health care provider. Ask your health care provider if you can take a sip of water with any approved medicines.  On the day of surgery, your healthcare provider will use a mammogram or ultrasound to locate and mark the tumor in your breast. These markings on your breast will show where the cut (incision) will be made. PROCEDURE   An IV tube will be put into one of your veins.  You may be given medicine to help you relax before the surgery (sedative). You will be given one of the following:  A medicine that numbs the area (local anesthesia).  A medicine that makes you go to sleep (general anesthesia).  Your health care provider will use a kind of electric scalpel that uses heat to minimize bleeding (electrocautery knife).  A curved incision (like a smile or frown) that follows the natural curve of your breast is made, to allow for minimal scarring and better healing.  The tumor will be removed with some of the surrounding tissue. This will be sent to the lab for analysis. Your health care provider may also remove your lymph nodes at this time if needed.  Sometimes, but not always, a rubber tube called a drain will be surgically inserted into your breast area or armpit to collect excess fluid that may accumulate in the space where the tumor was. This drain is connected to a plastic bulb on the outside of your body. This drain creates suction to help remove the fluid.  The incisions will be closed with stitches (sutures).  A bandage may be placed over the incisions. AFTER THE PROCEDURE  You will  be taken to the recovery area.  You will be given medicine for pain.  A small rubber drain may be placed in the breast for 2 3 days to prevent a collection of blood (hematoma) from developing in the breast. You will be given instructions on caring for the drain before you go home.  A pressure bandage (dressing) will be applied for 1 2 days to prevent bleeding. Ask  your health care provider how to care for your bandage at home. Document Released: 02/26/2006 Document Revised: 09/17/2012 Document Reviewed: 06/20/2012 [facility] Patient Information 2014 [place].

## 2013-06-20 ENCOUNTER — Encounter: Payer: Self-pay | Admitting: Internal Medicine

## 2013-06-24 ENCOUNTER — Other Ambulatory Visit (INDEPENDENT_AMBULATORY_CARE_PROVIDER_SITE_OTHER): Payer: Self-pay | Admitting: General Surgery

## 2013-06-24 ENCOUNTER — Telehealth (INDEPENDENT_AMBULATORY_CARE_PROVIDER_SITE_OTHER): Payer: Self-pay | Admitting: General Surgery

## 2013-06-24 DIAGNOSIS — D0590 Unspecified type of carcinoma in situ of unspecified breast: Secondary | ICD-10-CM

## 2013-06-24 NOTE — Telephone Encounter (Signed)
Patient is scheduled for surgery in about 2 weeks. She called to make sure that it was okay to wait that long. I assured her that it would be fine to wait 2 weeks to do her lumpectomy. We talked about this at length. She is now very comfortable and states that date works better for her schedule.  Edsel Petrin. Dalbert Batman, M.D., [facility] General and Minimally invasive Surgery Breast and Colorectal Surgery Office:   [phone] Pager:   [phone]

## 2013-06-29 DIAGNOSIS — C50919 Malignant neoplasm of unspecified site of unspecified female breast: Secondary | ICD-10-CM

## 2013-06-29 HISTORY — DX: Malignant neoplasm of unspecified site of unspecified female breast: C50.919

## 2013-07-06 ENCOUNTER — Encounter (HOSPITAL_BASED_OUTPATIENT_CLINIC_OR_DEPARTMENT_OTHER): Payer: Self-pay | Admitting: *Deleted

## 2013-07-06 NOTE — Progress Notes (Signed)
To come in for labs

## 2013-07-09 ENCOUNTER — Encounter (HOSPITAL_BASED_OUTPATIENT_CLINIC_OR_DEPARTMENT_OTHER)
Admission: RE | Admit: 2013-07-09 | Discharge: 2013-07-09 | Disposition: A | Discharge status: Home / Self Care | Payer: BC Managed Care – PPO | Source: Ambulatory Visit | Attending: General Surgery | Admitting: General Surgery

## 2013-07-09 ENCOUNTER — Ambulatory Visit
Admission: RE | Admit: 2013-07-09 | Discharge: 2013-07-09 | Disposition: A | Discharge status: Home / Self Care | Payer: BC Managed Care – PPO | Source: Ambulatory Visit | Attending: General Surgery | Admitting: General Surgery

## 2013-07-09 DIAGNOSIS — Z01812 Encounter for preprocedural laboratory examination: Secondary | ICD-10-CM | POA: Insufficient documentation

## 2013-07-09 DIAGNOSIS — D0590 Unspecified type of carcinoma in situ of unspecified breast: Secondary | ICD-10-CM

## 2013-07-09 LAB — CBC WITH DIFFERENTIAL/PLATELET
Basophils Absolute: 0 10*3/uL (ref 0.0–0.1)
Basophils Relative: 0 % (ref 0–1)
Eosinophils Absolute: 0.1 10*3/uL (ref 0.0–0.7)
Eosinophils Relative: 1 % (ref 0–5)
HEMATOCRIT: 39.9 % (ref 36.0–46.0)
HEMOGLOBIN: 13 g/dL (ref 12.0–15.0)
LYMPHS ABS: 2.3 10*3/uL (ref 0.7–4.0)
Lymphocytes Relative: 34 % (ref 12–46)
MCH: 32.8 pg (ref 26.0–34.0)
MCHC: 32.6 g/dL (ref 30.0–36.0)
MCV: 100.8 fL — ABNORMAL HIGH (ref 78.0–100.0)
Monocytes Absolute: 0.5 10*3/uL (ref 0.1–1.0)
Monocytes Relative: 8 % (ref 3–12)
NEUTROS PCT: 57 % (ref 43–77)
Neutro Abs: 3.9 10*3/uL (ref 1.7–7.7)
Platelets: 195 10*3/uL (ref 150–400)
RBC: 3.96 MIL/uL (ref 3.87–5.11)
RDW: 14 % (ref 11.5–15.5)
WBC: 6.9 10*3/uL (ref 4.0–10.5)

## 2013-07-09 LAB — COMPREHENSIVE METABOLIC PANEL
ALK PHOS: 53 U/L (ref 39–117)
ALT: 16 U/L (ref 0–35)
AST: 25 U/L (ref 0–37)
Albumin: 4.1 g/dL (ref 3.5–5.2)
BUN: 15 mg/dL (ref 6–23)
CO2: 28 mEq/L (ref 19–32)
Calcium: 9.8 mg/dL (ref 8.4–10.5)
Chloride: 98 mEq/L (ref 96–112)
Creatinine, Ser: 0.71 mg/dL (ref 0.50–1.10)
GFR calc Af Amer: >90 mL/min (ref >90)
GFR calc non Af Amer: 90 mL/min — ABNORMAL LOW (ref >90)
Glucose, Bld: 74 mg/dL (ref 70–99)
POTASSIUM: 6.3 meq/L — AB (ref 3.7–5.3)
Sodium: 139 mEq/L (ref 137–147)
Total Bilirubin: 0.4 mg/dL (ref 0.3–1.2)
Total Protein: 7 g/dL (ref 6.0–8.3)

## 2013-07-12 NOTE — H&P (Signed)
THRESEA DOBLE    MRN:  [id number]   Description: 63 year old female  Provider: Adin Hector, MD  Department: [facility]                   Diagnoses      Breast cancer in situ    -  Primary      233.0         Reason for Visit      New Evaluation      New breast cancer eval LT Br Ca               Current Vitals Most recent update: 06/19/2013  4:10 PM by Elwyn Lade, CMA      BP Pulse Temp(Src) Resp Ht Wt      140/80 74 97.8 F (36.6 C) (Temporal) 16 5\' 5"  (1.651 m) 129 lb 6.4 oz (58.695 kg)      BMI 21.53 kg/m2                    History and Physical     Adin Hector, MD      Status: Addendum            Patient ID: Blanca Friend, female   DOB: [birth year], 49 y.o.   MRN: [id number]              Note:  This dictation was prepared with Dragon/digital dictation along with [facility] technology. Any transcriptional errors that result from this process are unintentional.    HPI ELLAJANE STONG is a 63 y.o. female.  She is referred by Dr. Nolon Nations at the breast center of [facility] for her newly diagnosed ductal carcinoma in situ left breast, upper outer quadrant. Hormone receptors pending. Dr. Unice Cobble is her PCP. Dr. Willis Modena is her GYN. Her husband is with her throughout the encounter today. ADDENDUM: ER neg., PR neg.   She had a left breast biopsy for benign cyst by Dr. Leafy Kindle many years ago, otherwise no breast problems. She gets annual mammography. Recent screening mammogram show a 10 mm area of pleomorphic calcifications in the deep upper outer quadrant of the left breast. There wasn't much of a mass effect with the breasts are dense. Image guided biopsy showed ductal carcinoma in situ, ER and PR pending.   MRI shows 2.1 cm enhancement, consistent with cancer deep in the upper outer quadrant left breast. There were some cysts. Otherwise no abnormal findings.   Comorbidities are minimal. She is healthy. 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 Her husband is with her.   Family history reveals breast cancer in a first cousin otherwise negative.        Past Medical History   Diagnosis  Date   .  Fibrocystic breast disease     .  Osteoporosis     .  Hyperlipidemia           Past Surgical History   Procedure  Laterality  Date   .  Tubal ligation    1994   .  Colonoscopy    2002       negative; [facility]   .  Breast lumpectomy    1980       cyst   .  Breast cyst aspiration             Family History   Problem  Relation  Age of  Onset   .  Diabetes  Mother     .  Transient ischemic attack  Mother     .  Hypertension  Mother     .  Heart disease  Father         ? MI   .  Valvular heart disease  Father         Aortic valve   .  Throat cancer  Maternal Uncle     .  Cancer  Maternal Uncle         ? primary   .  Osteoporosis  Sister     .  Valvular heart disease  Paternal Uncle         Aortic valve   .  Cardiomyopathy  Sister          Social History History   Substance Use Topics   .  Smoking status:  Never Smoker    .  Smokeless tobacco:  Never Used   .  Alcohol Use:  No        No Known Allergies    Current Outpatient Prescriptions   Medication  Sig  Dispense  Refill   .  Calcium Carbonate-Vitamin D (CALTRATE 600+D) 600-400 MG-UNIT per tablet  Take 1 tablet by mouth 2 (two) times daily.         .  cholecalciferol (VITAMIN D) 400 UNITS TABS  Take 400 Units by mouth daily.         .  minocycline (MINOCIN,DYNACIN) 100 MG capsule  Take 100 mg by mouth daily as needed. Rosacea         .  Multiple Vitamins-Minerals (MULTIVITAMIN WITH MINERALS) tablet  Take 1 tablet by mouth daily.         Marland Kitchen  oxybutynin (DITROPAN-XL) 10 MG 24 hr tablet  Take 10 mg by mouth daily.         .  vitamin C (ASCORBIC ACID) 500 MG tablet  Take 500 mg by mouth daily.             No current facility-administered medications for this visit.        Review  of Systems   Constitutional: Negative for fever, chills and unexpected weight change.  HENT: Negative for congestion, hearing loss, sore throat, trouble swallowing and voice change.   Eyes: Negative for visual disturbance.  Respiratory: Negative for cough and wheezing.   Cardiovascular: Negative for chest pain, palpitations and leg swelling.  Gastrointestinal: Negative for nausea, vomiting, abdominal pain, diarrhea, constipation, blood in stool, abdominal distention and anal bleeding.  Genitourinary: Negative for hematuria, vaginal bleeding and difficulty urinating.  Musculoskeletal: Negative for arthralgias.  Skin: Negative for rash and wound.  Neurological: Negative for seizures, syncope and headaches.  Hematological: Negative for adenopathy. Does not bruise/bleed easily.  Psychiatric/Behavioral: Negative for confusion.      Blood pressure 140/80, pulse 74, temperature 97.8 F (36.6 C), temperature source Temporal, resp. rate 16, height 5\' 5"  (1.651 m), weight 129 lb 6.4 oz (58.695 kg).   Physical Exam  Constitutional: She is oriented to person, place, and time. She appears well-developed and well-nourished. No distress.  HENT:   Head: Normocephalic and atraumatic.   Nose: Nose normal.   Mouth/Throat: No oropharyngeal exudate.  Eyes: Conjunctivae and EOM are normal. Pupils are equal, round, and reactive to light. Left eye exhibits no discharge. No scleral icterus.  Neck: Neck supple. No JVD present. No tracheal deviation present. No thyromegaly present.  Cardiovascular: Normal rate, regular  rhythm, normal heart sounds and intact distal pulses.    No murmur heard. Pulmonary/Chest: Effort normal and breath sounds normal. No respiratory distress. She has no wheezes. She has no rales. She exhibits no tenderness.  Breasts are relatively small. 34B bra size by history. There is a biopsy site and a tiny hematoma under the biopsy site at the 3:00 position. Otherwise no palpable mass or  skin change. Well-healed transverse scar at 12:00. No axillary adenopathy on either side.  Abdominal: Soft. Bowel sounds are normal. She exhibits no distension and no mass. There is no tenderness. There is no rebound and no guarding.  Musculoskeletal: She exhibits no edema and no tenderness.  Lymphadenopathy:    She has no cervical adenopathy.  Neurological: She is alert and oriented to person, place, and time. She exhibits normal muscle tone. Coordination normal.  Skin: Skin is warm. No rash noted. She is not diaphoretic. No erythema. No pallor.  Psychiatric: She has a normal mood and affect. Her behavior is normal. Judgment and thought content normal.      Data Reviewed I reviewed all of her imaging studies and her histology report.   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 10 mm by mammography. 20 mm by MRI. Receptor assay pending. ADDENDUM: ER/PR both negative.   No significant comorbidities other than mild osteopenia      Plan    We spent a long time talking about treatment of breast cancer in general and specific details of her individual situation. We talked about lumpectomy, radiation therapy, and sentinel lymph node biopsy since the tumor is in the upper outer quadrant and within a few with future mapping. We talked about mastectomy with or without reconstruction. I gave her information booklets and educational information to take home. I spent the better part of an hour with education. She was offered preoperative consultation with medical oncology and radiation oncology, and she stated she preferred post op referral..   At the end of the conversation she decided to go ahead with left partial mastectomy with radioactive seed localization and left axillary sentinel node biopsy and referral to medical oncology and radiation oncology postop. She says she is willing to undergo radiation therapy.    She is aware that her  breasts are relatively small, and although we will make all efforts to transfer tissue and reconstruct that there may be a cosmetic defect. She is accepting of this. She is also aware that if there is a possibility of invasive cancer with more extensive treatment that I have outlined.   I discussed the indications, details, techniques and numerous risks of partial mastectomy and sentinel node biopsy. She is aware of the risk of bleeding, infection, reoperation for positive margins were positive nodes, cosmetic deformity, and other unforeseen problems. She understands these issues well. At this time all of her questions are ensured. She agrees with this plan.          Edsel Petrin. Dalbert Batman, M.D., [facility] General and Minimally invasive Surgery Breast and Colorectal Surgery Office:   [phone] Pager:   [phone]

## 2013-07-13 ENCOUNTER — Ambulatory Visit
Admission: RE | Admit: 2013-07-13 | Discharge: 2013-07-13 | Disposition: A | Discharge status: Home / Self Care | Payer: BC Managed Care – PPO | Source: Ambulatory Visit | Attending: General Surgery | Admitting: General Surgery

## 2013-07-13 ENCOUNTER — Ambulatory Visit (HOSPITAL_BASED_OUTPATIENT_CLINIC_OR_DEPARTMENT_OTHER)
Admission: RE | Admit: 2013-07-13 | Discharge: 2013-07-13 | Disposition: A | Discharge status: Home / Self Care | Payer: BC Managed Care – PPO | Source: Ambulatory Visit | Attending: General Surgery | Admitting: General Surgery

## 2013-07-13 ENCOUNTER — Encounter (HOSPITAL_BASED_OUTPATIENT_CLINIC_OR_DEPARTMENT_OTHER): Payer: BC Managed Care – PPO | Admitting: Anesthesiology

## 2013-07-13 ENCOUNTER — Ambulatory Visit (HOSPITAL_BASED_OUTPATIENT_CLINIC_OR_DEPARTMENT_OTHER): Payer: BC Managed Care – PPO | Admitting: Anesthesiology

## 2013-07-13 ENCOUNTER — Other Ambulatory Visit (INDEPENDENT_AMBULATORY_CARE_PROVIDER_SITE_OTHER): Payer: Self-pay | Admitting: General Surgery

## 2013-07-13 ENCOUNTER — Encounter (HOSPITAL_BASED_OUTPATIENT_CLINIC_OR_DEPARTMENT_OTHER): Payer: Self-pay

## 2013-07-13 ENCOUNTER — Encounter (HOSPITAL_BASED_OUTPATIENT_CLINIC_OR_DEPARTMENT_OTHER)
Admission: RE | Disposition: A | Discharge status: Home / Self Care | Payer: Self-pay | Source: Ambulatory Visit | Attending: General Surgery

## 2013-07-13 ENCOUNTER — Ambulatory Visit (HOSPITAL_COMMUNITY)
Admission: RE | Admit: 2013-07-13 | Discharge: 2013-07-13 | Disposition: A | Discharge status: Home / Self Care | Payer: BC Managed Care – PPO | Source: Ambulatory Visit | Attending: General Surgery | Admitting: General Surgery

## 2013-07-13 DIAGNOSIS — M810 Age-related osteoporosis without current pathological fracture: Secondary | ICD-10-CM | POA: Insufficient documentation

## 2013-07-13 DIAGNOSIS — Z79899 Other long term (current) drug therapy: Secondary | ICD-10-CM | POA: Insufficient documentation

## 2013-07-13 DIAGNOSIS — D0590 Unspecified type of carcinoma in situ of unspecified breast: Secondary | ICD-10-CM

## 2013-07-13 DIAGNOSIS — IMO0002 Reserved for concepts with insufficient information to code with codable children: Secondary | ICD-10-CM | POA: Insufficient documentation

## 2013-07-13 DIAGNOSIS — E785 Hyperlipidemia, unspecified: Secondary | ICD-10-CM | POA: Insufficient documentation

## 2013-07-13 DIAGNOSIS — C50912 Malignant neoplasm of unspecified site of left female breast: Secondary | ICD-10-CM

## 2013-07-13 HISTORY — DX: Nausea with vomiting, unspecified: R11.2

## 2013-07-13 HISTORY — PX: BREAST LUMPECTOMY: SHX2

## 2013-07-13 HISTORY — DX: Other specified postprocedural states: Z98.890

## 2013-07-13 LAB — POCT HEMOGLOBIN-HEMACUE: Hemoglobin: 13.5 g/dL (ref 12.0–15.0)

## 2013-07-13 SURGERY — RADIOACTIVE SEED GUIDED PARTIAL MASTECTOMY WITH AXILLARY SENTINEL LYMPH NODE BIOPSY
Anesthesia: General | Site: Breast | Laterality: Left

## 2013-07-13 MED ORDER — MEPERIDINE HCL 25 MG/ML IJ SOLN: 6.2500 mg | INTRAMUSCULAR | Status: DC | PRN

## 2013-07-13 MED ORDER — MIDAZOLAM HCL 2 MG/2ML IJ SOLN
INTRAMUSCULAR | Status: AC
  Filled 2013-07-13: qty 2

## 2013-07-13 MED ORDER — ONDANSETRON HCL 4 MG/2ML IJ SOLN
INTRAMUSCULAR | Status: AC
  Filled 2013-07-13: qty 2

## 2013-07-13 MED ORDER — FENTANYL CITRATE 0.05 MG/ML IJ SOLN
INTRAMUSCULAR | Status: DC | PRN
  Administered 2013-07-13: 100 ug via INTRAVENOUS

## 2013-07-13 MED ORDER — HYDROMORPHONE HCL PF 1 MG/ML IJ SOLN
INTRAMUSCULAR | Status: AC
  Filled 2013-07-13: qty 1

## 2013-07-13 MED ORDER — CEFAZOLIN SODIUM-DEXTROSE 2-3 GM-% IV SOLR
2.0000 g | INTRAVENOUS | Status: AC
  Administered 2013-07-13: 2 g via INTRAVENOUS

## 2013-07-13 MED ORDER — CEFAZOLIN SODIUM-DEXTROSE 2-3 GM-% IV SOLR
INTRAVENOUS | Status: AC
  Filled 2013-07-13: qty 50

## 2013-07-13 MED ORDER — OXYCODONE HCL 5 MG PO TABS: 5.0000 mg | ORAL_TABLET | Freq: Once | ORAL | Status: DC | PRN

## 2013-07-13 MED ORDER — SUCCINYLCHOLINE CHLORIDE 20 MG/ML IJ SOLN
INTRAMUSCULAR | Status: AC
  Filled 2013-07-13: qty 1

## 2013-07-13 MED ORDER — BUPIVACAINE-EPINEPHRINE 0.5% -1:200000 IJ SOLN
INTRAMUSCULAR | Status: DC | PRN
  Administered 2013-07-13: 7.5 mL

## 2013-07-13 MED ORDER — PROPOFOL 10 MG/ML IV EMUL
INTRAVENOUS | Status: AC
  Filled 2013-07-13: qty 100

## 2013-07-13 MED ORDER — LACTATED RINGERS IV SOLN
INTRAVENOUS | Status: DC
  Administered 2013-07-13 (×2): via INTRAVENOUS

## 2013-07-13 MED ORDER — LIDOCAINE HCL (CARDIAC) 20 MG/ML IV SOLN
INTRAVENOUS | Status: DC | PRN
  Administered 2013-07-13: 75 mg via INTRAVENOUS

## 2013-07-13 MED ORDER — FENTANYL CITRATE 0.05 MG/ML IJ SOLN
INTRAMUSCULAR | Status: AC
Start: 2013-07-13 — End: 2013-07-13
  Filled 2013-07-13: qty 6

## 2013-07-13 MED ORDER — MIDAZOLAM HCL 5 MG/5ML IJ SOLN
INTRAMUSCULAR | Status: DC | PRN
Start: 2013-07-13 — End: 2013-07-13
  Administered 2013-07-13: 2 mg via INTRAVENOUS

## 2013-07-13 MED ORDER — DEXAMETHASONE SODIUM PHOSPHATE 4 MG/ML IJ SOLN
INTRAMUSCULAR | Status: DC | PRN
  Administered 2013-07-13: 10 mg via INTRAVENOUS

## 2013-07-13 MED ORDER — HYDROCODONE-ACETAMINOPHEN 5-325 MG PO TABS: 1.0000 | ORAL_TABLET | Freq: Four times a day (QID) | ORAL | Status: DC | PRN

## 2013-07-13 MED ORDER — HYDROMORPHONE HCL PF 1 MG/ML IJ SOLN
0.2500 mg | INTRAMUSCULAR | Status: DC | PRN
  Administered 2013-07-13 (×4): 0.5 mg via INTRAVENOUS

## 2013-07-13 MED ORDER — TECHNETIUM TC 99M SULFUR COLLOID FILTERED
1.0000 | Freq: Once | INTRAVENOUS | Status: AC | PRN
  Administered 2013-07-13: 1 via INTRADERMAL

## 2013-07-13 MED ORDER — METHYLENE BLUE 1 % INJ SOLN
INTRAMUSCULAR | Status: AC
  Filled 2013-07-13: qty 10

## 2013-07-13 MED ORDER — OXYCODONE HCL 5 MG/5ML PO SOLN: 5.0000 mg | Freq: Once | ORAL | Status: DC | PRN

## 2013-07-13 MED ORDER — ONDANSETRON HCL 4 MG/2ML IJ SOLN
4.0000 mg | Freq: Once | INTRAMUSCULAR | Status: AC | PRN
  Administered 2013-07-13: 4 mg via INTRAVENOUS

## 2013-07-13 MED ORDER — PROPOFOL 10 MG/ML IV BOLUS
INTRAVENOUS | Status: DC | PRN
  Administered 2013-07-13: 150 mg via INTRAVENOUS

## 2013-07-13 MED ORDER — EPHEDRINE SULFATE 50 MG/ML IJ SOLN
INTRAMUSCULAR | Status: DC | PRN
  Administered 2013-07-13 (×2): 10 mg via INTRAVENOUS

## 2013-07-13 MED ORDER — CHLORHEXIDINE GLUCONATE 4 % EX LIQD: 1.0000 "application " | Freq: Once | CUTANEOUS | Status: DC

## 2013-07-13 MED ORDER — FENTANYL CITRATE 0.05 MG/ML IJ SOLN
50.0000 ug | INTRAMUSCULAR | Status: DC | PRN
  Administered 2013-07-13: 50 ug via INTRAVENOUS

## 2013-07-13 MED ORDER — SODIUM CHLORIDE 0.9 % IJ SOLN
INTRAMUSCULAR | Status: DC | PRN
  Administered 2013-07-13: 08:00:00 via INTRAMUSCULAR

## 2013-07-13 MED ORDER — MIDAZOLAM HCL 2 MG/2ML IJ SOLN
1.0000 mg | INTRAMUSCULAR | Status: DC | PRN
  Administered 2013-07-13: 1 mg via INTRAVENOUS

## 2013-07-13 MED ORDER — FENTANYL CITRATE 0.05 MG/ML IJ SOLN
INTRAMUSCULAR | Status: AC
  Filled 2013-07-13: qty 2

## 2013-07-13 MED ORDER — BUPIVACAINE-EPINEPHRINE (PF) 0.5% -1:200000 IJ SOLN
INTRAMUSCULAR | Status: AC
  Filled 2013-07-13: qty 30

## 2013-07-13 MED ORDER — SODIUM CHLORIDE 0.9 % IJ SOLN
INTRAMUSCULAR | Status: AC
  Filled 2013-07-13: qty 10

## 2013-07-13 MED ORDER — ONDANSETRON HCL 4 MG/2ML IJ SOLN
INTRAMUSCULAR | Status: DC | PRN
  Administered 2013-07-13: 4 mg via INTRAVENOUS

## 2013-07-13 SURGICAL SUPPLY — 67 items
ADH SKN CLS APL DERMABOND .7 (GAUZE/BANDAGES/DRESSINGS) ×1
APL SKNCLS STERI-STRIP NONHPOA (GAUZE/BANDAGES/DRESSINGS)
APPLIER CLIP 9.375 MED OPEN (MISCELLANEOUS) ×3
APR CLP MED 9.3 20 MLT OPN (MISCELLANEOUS) ×1
BENZOIN TINCTURE PRP APPL 2/3 (GAUZE/BANDAGES/DRESSINGS) IMPLANT
BINDER BREAST LRG (GAUZE/BANDAGES/DRESSINGS) IMPLANT
BINDER BREAST MEDIUM (GAUZE/BANDAGES/DRESSINGS) ×2 IMPLANT
BINDER BREAST XLRG (GAUZE/BANDAGES/DRESSINGS) IMPLANT
BINDER BREAST XXLRG (GAUZE/BANDAGES/DRESSINGS) IMPLANT
BLADE HEX COATED 2.75 (ELECTRODE) ×3 IMPLANT
BLADE SURG 10 STRL SS (BLADE) IMPLANT
BLADE SURG 15 STRL LF DISP TIS (BLADE) ×1 IMPLANT
BLADE SURG 15 STRL SS (BLADE) ×6
CANISTER SUC SOCK COL 7IN (MISCELLANEOUS) ×1 IMPLANT
CANISTER SUCT 1200ML W/VALVE (MISCELLANEOUS) ×3 IMPLANT
CHLORAPREP W/TINT 26ML (MISCELLANEOUS) ×3 IMPLANT
CLIP APPLIE 9.375 MED OPEN (MISCELLANEOUS) ×1 IMPLANT
CLOSURE WOUND 1/2 X4 (GAUZE/BANDAGES/DRESSINGS)
COVER MAYO STAND STRL (DRAPES) ×3 IMPLANT
COVER PROBE W GEL 5X96 (DRAPES) ×3 IMPLANT
COVER TABLE BACK 60X90 (DRAPES) ×3 IMPLANT
DECANTER SPIKE VIAL GLASS SM (MISCELLANEOUS) IMPLANT
DERMABOND ADVANCED (GAUZE/BANDAGES/DRESSINGS) ×2
DERMABOND ADVANCED .7 DNX12 (GAUZE/BANDAGES/DRESSINGS) ×1 IMPLANT
DEVICE DUBIN W/COMP PLATE 8390 (MISCELLANEOUS) ×3 IMPLANT
DRAPE LAPAROSCOPIC ABDOMINAL (DRAPES) ×3 IMPLANT
DRAPE UTILITY XL STRL (DRAPES) ×3 IMPLANT
DRSG PAD ABDOMINAL 8X10 ST (GAUZE/BANDAGES/DRESSINGS) IMPLANT
ELECT REM PT RETURN 9FT ADLT (ELECTROSURGICAL) ×3
ELECTRODE REM PT RTRN 9FT ADLT (ELECTROSURGICAL) ×1 IMPLANT
GLOVE BIO SURGEON STRL SZ7 (GLOVE) ×2 IMPLANT
GLOVE BIOGEL PI IND STRL 7.5 (GLOVE) IMPLANT
GLOVE BIOGEL PI INDICATOR 7.5 (GLOVE) ×2
GLOVE EUDERMIC 7 POWDERFREE (GLOVE) ×6 IMPLANT
GLOVE EXAM NITRILE PF MED BLUE (GLOVE) ×2 IMPLANT
GOWN STRL REUS W/ TWL LRG LVL3 (GOWN DISPOSABLE) ×2 IMPLANT
GOWN STRL REUS W/ TWL XL LVL3 (GOWN DISPOSABLE) ×1 IMPLANT
GOWN STRL REUS W/TWL LRG LVL3 (GOWN DISPOSABLE) ×3
GOWN STRL REUS W/TWL XL LVL3 (GOWN DISPOSABLE) ×3
KIT MARKER MARGIN INK (KITS) ×3 IMPLANT
NDL HYPO 25X1 1.5 SAFETY (NEEDLE) ×2 IMPLANT
NDL SAFETY ECLIPSE 18X1.5 (NEEDLE) ×1 IMPLANT
NEEDLE HYPO 18GX1.5 SHARP (NEEDLE) ×3
NEEDLE HYPO 25X1 1.5 SAFETY (NEEDLE) ×6 IMPLANT
NS IRRIG 1000ML POUR BTL (IV SOLUTION) ×3 IMPLANT
PACK BASIN DAY SURGERY FS (CUSTOM PROCEDURE TRAY) ×3 IMPLANT
PENCIL BUTTON HOLSTER BLD 10FT (ELECTRODE) ×3 IMPLANT
SHEET MEDIUM DRAPE 40X70 STRL (DRAPES) ×2 IMPLANT
SLEEVE SCD COMPRESS KNEE MED (MISCELLANEOUS) ×3 IMPLANT
SPONGE GAUZE 4X4 12PLY STER LF (GAUZE/BANDAGES/DRESSINGS) IMPLANT
SPONGE LAP 18X18 X RAY DECT (DISPOSABLE) IMPLANT
SPONGE LAP 4X18 X RAY DECT (DISPOSABLE) ×3 IMPLANT
STRIP CLOSURE SKIN 1/2X4 (GAUZE/BANDAGES/DRESSINGS) IMPLANT
SUT ETHILON 3 0 FSL (SUTURE) IMPLANT
SUT MNCRL AB 4-0 PS2 18 (SUTURE) ×3 IMPLANT
SUT SILK 2 0 SH (SUTURE) ×3 IMPLANT
SUT VIC AB 2-0 CT1 27 (SUTURE)
SUT VIC AB 2-0 CT1 TAPERPNT 27 (SUTURE) IMPLANT
SUT VIC AB 3-0 SH 27 (SUTURE)
SUT VIC AB 3-0 SH 27X BRD (SUTURE) IMPLANT
SUT VICRYL 3-0 CR8 SH (SUTURE) ×3 IMPLANT
SYR CONTROL 10ML LL (SYRINGE) ×6 IMPLANT
TOWEL OR 17X24 6PK STRL BLUE (TOWEL DISPOSABLE) ×3 IMPLANT
TOWEL OR NON WOVEN STRL DISP B (DISPOSABLE) ×3 IMPLANT
TUBE CONNECTING 20'X1/4 (TUBING) ×1
TUBE CONNECTING 20X1/4 (TUBING) ×2 IMPLANT
YANKAUER SUCT BULB TIP NO VENT (SUCTIONS) ×3 IMPLANT

## 2013-07-13 NOTE — Transfer of Care (Signed)
Immediate Anesthesia Transfer of Care Note  Patient: Sherry Wells  Procedure(s) Performed: Procedure(s): RADIOACTIVE SEED GUIDED PARTIAL MASTECTOMY WITH AXILLARY SENTINEL LYMPH NODE BIOPSY (Left)  Patient Location: PACU  Anesthesia Type:General  Level of Consciousness: awake  Airway & Oxygen Therapy: Patient Spontanous Breathing and Patient connected to face mask oxygen  Post-op Assessment: Report given to PACU RN and Post -op Vital signs reviewed and stable  Post vital signs: Reviewed and stable  Complications: No apparent anesthesia complications

## 2013-07-13 NOTE — Op Note (Signed)
Patient Name:           Sherry Wells   Date of Surgery:        07/13/2013  Pre op Diagnosis:      Ductal carcinoma in situ left breast, 3:00 position, receptor negative.  Post op Diagnosis:    Same  Procedure:                 Inject blue dye left breast Left partial mastectomy with radioactive seed localization and margin assessment Left axillary sentinel node biopsy  Surgeon:                     Edsel Petrin. Dalbert Batman, M.D., FACS  Assistant:                      None  Operative Indications:   Sherry Wells is a 63 y.o. female. She is referred by Dr. Nolon Nations at the breast center of [facility] for her newly diagnosed ductal carcinoma in situ left breast, upper outer quadrant.  Dr. Unice Cobble is her PCP. Dr. Willis Modena is her GYN.   She had a left breast biopsy for benign cyst by Dr. Leafy Kindle many years ago, otherwise no breast problems. She gets annual mammography. Recent screening mammogram show a 10 mm area of pleomorphic calcifications in the deep upper outer quadrant of the left breast. There wasn't much of a mass effect with the breasts are dense. Image guided biopsy showed ductal carcinoma in situ, ER and PR negative.. Examination reveals small breasts, 34B bra size by history. Biopsy site and tiny hematoma from the biopsy site at 3:00 position. Well-healed transverse scar at 12:00. No palpable adenopathy. MRI shows 2.1 cm enhancement, consistent with cancer deep in the upper outer quadrant left breast. There were some cysts. Otherwise no abnormal findings.   Family history reveals breast cancer in a first cousin otherwise negative.   Operative Findings:       Radioactivity consistent with seed placement noted in preop holding area at 3:00 position. Lumpectomy specimen contained the radioactive seed and the titanium marker clip, relatively centrally located within the lumpectomy specimen. Pathology called and confirmed receipt of the radioactive seed. There was no palpable  abnormality. The dissection was taken down to the pectoralis muscle and so the muscle is the posterior margin. I found 3 sentinel lymph nodes, all of which were very hot and very blue.  Procedure in Detail:          Radioactive seed was placed a few days ago by the radiologist. The left breast was injected with radionuclide by the nuclear medicine technician in the holding area. The patient was brought to the operating room and underwent general anesthesia with LMA device. Following surgical time out and alcohol prep,  I injected 5 cc of blue dye into the left breast, subareolar area. This was 2 cc of methylene blue mixed with 3 cc of saline. The breast was massaged for a few minutes. The left breast and axilla and chest wall were then prepped and draped in a sterile fashion. 0.5% Marcaine with epinephrine was used as local infiltration anesthetic. Using the neoprobe I  identified the area of maximum radioactivity in the left breast at 3:00 position. A transverse radially oriented incision was made in this location. Using the neoprobe as a guide I dissected widely around this area. The lumpectomy specimen contained radioactivity. It was marked with silk sutures and ink to orient the pathologist.  Specimen mammogram looked good, as described above. Hemostasis was excellent and achieved with electrocautery. Wound irrigated with saline. The lumpectomy cavity was marked with metallic clips. I undermined the breast tissue off the pectoralis major superiorly and inferiorly and then transferred this back together with multiple interrupted sutures of 3-0 Vicryl. Skin was closed with running subcuticular suture of 4-0 Monocryl and Dermabond.      Transverse incision was made in the left axilla at the hairline. Using the neoprobe as a guide I dissected down through the clavipectoral fascia into the axilla. I found 3 sentinel lymph nodes all of which were very hot very blue but not obviously pathologically enlarged. These  were sent for routine histology. This was irrigated with saline. Hemostasis was excellent and achieved with electrocautery. The clavipectoral fascia was closed with 3-0 Vicryl sutures and skin closed with running subcuticular suture of 4-0 Monocryl and Dermabond.      Sterile gauze  and a breast binder were placed. The patient tolerated the procedure well was taken to PACU in stable condition. EBL 20 cc or less. Counts correct. Complications none.     Edsel Petrin. Dalbert Batman, M.D., FACS General and Minimally Invasive Surgery Breast and Colorectal Surgery  07/13/2013 9:01 AM

## 2013-07-13 NOTE — Anesthesia Procedure Notes (Signed)
Procedure Name: LMA Insertion Date/Time: 07/13/2013 7:34 AM Performed by: Melynda Ripple D Pre-anesthesia Checklist: Patient identified, Emergency Drugs available, Suction available and Patient being monitored Patient Re-evaluated:Patient Re-evaluated prior to inductionOxygen Delivery Method: Circle System Utilized Preoxygenation: Pre-oxygenation with 100% oxygen Intubation Type: IV induction Ventilation: Mask ventilation without difficulty LMA: LMA inserted LMA Size: 4.0 Number of attempts: 1 Airway Equipment and Method: bite block Placement Confirmation: positive ETCO2 Tube secured with: Tape Dental Injury: Teeth and Oropharynx as per pre-operative assessment

## 2013-07-13 NOTE — Anesthesia Postprocedure Evaluation (Signed)
Anesthesia Post Note  Patient: Sherry Wells  Procedure(s) Performed: Procedure(s) (LRB): RADIOACTIVE SEED GUIDED PARTIAL MASTECTOMY WITH AXILLARY SENTINEL LYMPH NODE BIOPSY (Left)  Anesthesia type: general  Patient location: PACU  Post pain: Pain level controlled  Post assessment: Patient's Cardiovascular Status Stable  Last Vitals:  Filed Vitals:   07/13/13 1210  BP: 139/65  Pulse: 76  Temp: 36.4 C  Resp: 16    Post vital signs: Reviewed and stable  Level of consciousness: sedated  Complications: No apparent anesthesia complications

## 2013-07-13 NOTE — Anesthesia Preprocedure Evaluation (Signed)
Anesthesia Evaluation  Patient identified by MRN, date of birth, ID band Patient awake    Reviewed: Allergy & Precautions, H&P , NPO status , Patient's Chart, lab work & pertinent test results  History of Anesthesia Complications (+) PONV  Airway Mallampati: I TM Distance: >3 FB Neck ROM: Full    Dental   Pulmonary          Cardiovascular     Neuro/Psych    GI/Hepatic   Endo/Other    Renal/GU      Musculoskeletal   Abdominal   Peds  Hematology   Anesthesia Other Findings   Reproductive/Obstetrics                           Anesthesia Physical Anesthesia Plan  ASA: II  Anesthesia Plan: General   Post-op Pain Management:    Induction: Intravenous  Airway Management Planned: LMA  Additional Equipment:   Intra-op Plan:   Post-operative Plan: Extubation in OR  Informed Consent: I have reviewed the patients History and Physical, chart, labs and discussed the procedure including the risks, benefits and alternatives for the proposed anesthesia with the patient or authorized representative who has indicated his/her understanding and acceptance.     Plan Discussed with: CRNA and Surgeon  Anesthesia Plan Comments:         Anesthesia Quick Evaluation

## 2013-07-13 NOTE — Discharge Instructions (Signed)
Call Dr. Darrel Hoover office and make an appointment to see Dr. Dalbert Batman in approximately 3 weeks.  Dr. Darrel Hoover office will arrange referral to a medical oncologist and radiation oncologist.       [facility] Office Phone Number [phone]  BREAST BIOPSY/ PARTIAL MASTECTOMY: POST OP INSTRUCTIONS  Always review your discharge instruction sheet given to you by the facility where your surgery was performed.  IF YOU HAVE DISABILITY OR FAMILY LEAVE FORMS, YOU MUST BRING THEM TO THE OFFICE FOR PROCESSING.  DO NOT GIVE THEM TO YOUR DOCTOR.  1. A prescription for pain medication may be given to you upon discharge.  Take your pain medication as prescribed, if needed.  If narcotic pain medicine is not needed, then you may take acetaminophen (Tylenol) or ibuprofen (Advil) as needed. 2. Take your usually prescribed medications unless otherwise directed 3. If you need a refill on your pain medication, please contact your pharmacy.  They will contact our office to request authorization.  Prescriptions will not be filled after 5pm or on week-ends. 4. You should eat very light the first 24 hours after surgery, such as soup, crackers, pudding, etc.  Resume your normal diet the day after surgery. 5. Most patients will experience some swelling and bruising in the breast.  Ice packs and a good support bra will help.  Swelling and bruising can take several days to resolve.  6. It is common to experience some constipation if taking pain medication after surgery.  Increasing fluid intake and taking a stool softener will usually help or prevent this problem from occurring.  A mild laxative (Milk of Magnesia or Miralax) should be taken according to package directions if there are no bowel movements after 48 hours. 7. Unless discharge instructions indicate otherwise, you may remove your bandages 24-48 hours after surgery, and you may shower at that time.  You may have steri-strips (small skin tapes) in  place directly over the incision.  These strips should be left on the skin for 7-10 days.  If your surgeon used skin glue on the incision, you may shower in 24 hours.  The glue will flake off over the next 2-3 weeks.  Any sutures or staples will be removed at the office during your follow-up visit. 8. ACTIVITIES:  You may resume regular daily activities (gradually increasing) beginning the next day.  Wearing a good support bra or sports bra minimizes pain and swelling.  You may have sexual intercourse when it is comfortable. a. You may drive when you no longer are taking prescription pain medication, you can comfortably wear a seatbelt, and you can safely maneuver your car and apply brakes. b. RETURN TO WORK:  ______________________________________________________________________________________ 9. You should see your doctor in the office for a follow-up appointment approximately two weeks after your surgery.  Your doctors nurse will typically make your follow-up appointment when she calls you with your pathology report.  Expect your pathology report 2-3 business days after your surgery.  You may call to check if you do not hear from [place] after three days. 10. OTHER INSTRUCTIONS: _______________________________________________________________________________________________ _____________________________________________________________________________________________________________________________________ _____________________________________________________________________________________________________________________________________ _____________________________________________________________________________________________________________________________________  WHEN TO CALL YOUR DOCTOR: 1. Fever over 101.0 2. Nausea and/or vomiting. 3. Extreme swelling or bruising. 4. Continued bleeding from incision. 5. Increased pain, redness, or drainage from the incision.  The clinic staff is available to  answer your questions during regular business hours.  Please dont hesitate to call and ask to speak to one of the nurses for clinical concerns.  If  you have a medical emergency, go to the nearest emergency room or call 911.  A surgeon from [facility] is always on call at the hospital.  For further questions, please visit [email or web address]      Post Anesthesia Home Care Instructions  Activity: Get plenty of rest for the remainder of the day. A responsible adult should stay with you for 24 hours following the procedure.  For the next 24 hours, DO NOT: -Drive a car -Paediatric nurse -Drink alcoholic beverages -Take any medication unless instructed by your physician -Make any legal decisions or sign important papers.  Meals: Start with liquid foods such as gelatin or soup. Progress to regular foods as tolerated. Avoid greasy, spicy, heavy foods. If nausea and/or vomiting occur, drink only clear liquids until the nausea and/or vomiting subsides. Call your physician if vomiting continues.  Special Instructions/Symptoms: Your throat may feel dry or sore from the anesthesia or the breathing tube placed in your throat during surgery. If this causes discomfort, gargle with warm salt water. The discomfort should disappear within 24 hours.

## 2013-07-13 NOTE — Interval H&P Note (Signed)
History and Physical Interval Note:  07/13/2013 7:14 AM  Sherry Wells  has presented today for surgery, with the diagnosis of Left breast DCIS  The goals and the various methods of treatment have been discussed with the patient and family. After consideration of risks, benefits and other options for treatment, the patient has consented to  Procedure(s): RADIOACTIVE SEED GUIDED PARTIAL MASTECTOMY WITH AXILLARY SENTINEL LYMPH NODE BIOPSY (Left) as a surgical intervention .  The patient's history has been reviewed, patient examined today, no change in status, stable for surgery.  I have reviewed the patient's chart and labs.  Questions were answered to the patient's satisfaction.     Adin Hector

## 2013-07-15 ENCOUNTER — Telehealth: Payer: Self-pay | Admitting: *Deleted

## 2013-07-15 NOTE — Telephone Encounter (Signed)
Received referral from [facility] and noticed the pt lives in [place].  Called pt and offered [place] Na, but she works here in [employer] and wants to come here.  Confirmed 07/23/13 appt w/ pt.  Mailed before appt letter, welcoming packet & intake form to pt.  Emailed Annie at [employer] to make her aware.  Took paperwork to Med [facility] for chart.

## 2013-07-16 ENCOUNTER — Telehealth (INDEPENDENT_AMBULATORY_CARE_PROVIDER_SITE_OTHER): Payer: Self-pay

## 2013-07-16 NOTE — Telephone Encounter (Signed)
Patient calling into office checking to see if her pathology report was available.  Patient advised that the results are not available at this time.  Patient was asking about resuming her daily activities.  Patient advised that she can resume light activities.  No heavy lifting or straining.  Patient advised we will call once her pathology results are available.  Patient verbalized understanding.

## 2013-07-17 ENCOUNTER — Telehealth (INDEPENDENT_AMBULATORY_CARE_PROVIDER_SITE_OTHER): Payer: Self-pay

## 2013-07-17 NOTE — Telephone Encounter (Signed)
Pt is s/p SLN bx by Dr. Dalbert Batman on 07/13/13.  She is calling for results.  Contacted [facility] to follow up.  Dr. Lyndon Code has not signed off on the pathology, but the technician said it should be completed by 6/22.  Reassured the patient that this was not indicative of "bad news"; it is just protocol for this type of biopsy.  Pt will wait to hear from [place] first thing next week.

## 2013-07-20 ENCOUNTER — Telehealth (INDEPENDENT_AMBULATORY_CARE_PROVIDER_SITE_OTHER): Payer: Self-pay

## 2013-07-20 NOTE — Telephone Encounter (Signed)
Path results is pending , DR. Dalbert Batman or myself will call soon as we have her results.

## 2013-07-21 NOTE — Telephone Encounter (Signed)
Sherry Wells, We should have had this path by now. Please call Pathology ASAP this morning and see what's going on and let me know so we can get back to patient quickly.  hmi

## 2013-07-22 ENCOUNTER — Encounter: Payer: Self-pay | Admitting: *Deleted

## 2013-07-22 NOTE — Telephone Encounter (Signed)
Forwarded to [employer].

## 2013-07-22 NOTE — Progress Notes (Signed)
Quick Note:  Inform patient of Pathology report,. Dcis with negative margins and negative nodes. Good news. No further surgery indicated. Tell her we have no idea why this took so long. Make sure she has referral to med-onc(Magrinat) and rad-onc.  hmi ______

## 2013-07-22 NOTE — Telephone Encounter (Signed)
Call path report to patient.   Non invasive cancer, completely removed with negative margins, nodes negative. Good news.  No more surgery required.  refer to med-onc(Dr. Magrinat) and rad-onc.    hmi

## 2013-07-22 NOTE — Telephone Encounter (Signed)
Called DR. Lyndon Code spoke to Summers @[phone] she states the path has not been read /signed but she would inform DR. [place] ,DR. Dalbert Batman and patient is waiting for results. Patient is getting very concerned at this point. Christy call back number [phone] . Patient is aware , Reassured her I will call her when have results.

## 2013-07-22 NOTE — Telephone Encounter (Signed)
Any update on pathology. It is very unusual to have this delay.  hmi

## 2013-07-23 ENCOUNTER — Encounter: Payer: Self-pay | Admitting: Oncology

## 2013-07-23 ENCOUNTER — Ambulatory Visit (HOSPITAL_BASED_OUTPATIENT_CLINIC_OR_DEPARTMENT_OTHER): Payer: BC Managed Care – PPO | Admitting: Oncology

## 2013-07-23 ENCOUNTER — Telehealth: Payer: Self-pay | Admitting: Oncology

## 2013-07-23 ENCOUNTER — Other Ambulatory Visit: Payer: BC Managed Care – PPO

## 2013-07-23 ENCOUNTER — Ambulatory Visit: Payer: BC Managed Care – PPO

## 2013-07-23 VITALS — BP 135/74 | HR 67 | Temp 97.9°F | Resp 18 | Ht 65.0 in | Wt 128.0 lb

## 2013-07-23 DIAGNOSIS — D0590 Unspecified type of carcinoma in situ of unspecified breast: Secondary | ICD-10-CM

## 2013-07-23 DIAGNOSIS — Z171 Estrogen receptor negative status [ER-]: Secondary | ICD-10-CM

## 2013-07-23 NOTE — Progress Notes (Signed)
Please see consult note.  

## 2013-07-23 NOTE — Consult Note (Signed)
Reason for Referral: DCIS of the left breast.   HPI: Sherry Wells is a pleasant 63 year old woman of [place] where she currently resides. 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 She is in excellent health and shape and does not have really any major comorbid conditions. She's had a left breast cyst removed many years ago and actively seeds breast cancer surveillance the mammograms. Her most recent mammogram in April of 2015 showed abnormality in the left breast with spot microcalcification deep in the upper-outer quadrant. She subsequently underwent a left breast stereotactic guided core needle biopsy on 06/08/2013 which confirmed the presence of DCIS is ER/PR negative. On 07/13/2013 she underwent a left partial mastectomy and a left axillary sentinel lymph node sampling by Dr. Dalbert Batman. Patient tolerated the procedure well she did develop postoperative nausea vomiting which has been documented in the past with her with previous anesthesia. 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 The margins are at 0.2 cm and zero out of three lymph nodes were involved. The grade of the carcinoma is III/III with the final pathological staging at Tis N0. She was referred to me to discuss adjuvant therapy. She is scheduled to have a radiation oncology appointment in near future.  Clinically, she is asymptomatic. She does not report any headaches blurred vision double vision. Did not report any motor sensory neuropathy.  Supporting alteration of mental status or depression.fevers or chills or sweats. Does not report any cough or hemoptysis or hematemesis. Does not report any nausea or vomiting or abdominal pain. Does not report any leg edema palpitations and orthopnea. Does not report any frequency urgency or hesitancy. Does not report any skeletal complaints. Does not report any rashes  or lesions or lymphadenopathy. Rest of her review of systems unremarkable.   Past Medical History  Diagnosis Date  . Fibrocystic breast disease   . Osteoporosis   . Hyperlipidemia   . PONV (postoperative nausea and vomiting)   :  Past Surgical History  Procedure Laterality Date  . Tubal ligation  1994  . Colonoscopy  2002    negative; [facility]  . Breast lumpectomy  1980    cyst  . Breast cyst aspiration    . Dilation and curettage of uterus      x2  :  Current Outpatient Prescriptions  Medication Sig Dispense Refill  . Calcium Carbonate-Vitamin D (CALTRATE 600+D) 600-400 MG-UNIT per tablet Take 1 tablet by mouth 2 (two) times daily.      . cholecalciferol (VITAMIN D) 400 UNITS TABS Take 400 Units by mouth daily.      Marland Kitchen doxycycline (ORACEA) 40 MG capsule Take 40 mg by mouth every morning. As needed for rosacea      . HYDROcodone-acetaminophen (NORCO) 5-325 MG per tablet Take 1-2 tablets by mouth every 6 (six) hours as needed.  30 tablet  0  . minocycline (MINOCIN,DYNACIN) 100 MG capsule Take 100 mg by mouth daily as needed. Rosacea      . Multiple Vitamins-Minerals (MULTIVITAMIN WITH MINERALS) tablet Take 1 tablet by mouth daily.      Marland Kitchen oxybutynin (DITROPAN-XL) 10 MG 24 hr tablet Take 10 mg by mouth daily.      . vitamin C (ASCORBIC ACID) 500 MG tablet Take 500 mg by mouth daily.       No current facility-administered medications for this visit.  No Known Allergies:  Family History  Problem Relation Age of Onset  . Diabetes Mother   . Transient ischemic attack Mother   . Hypertension Mother   . Heart disease Father     ? MI  . Valvular heart disease Father     Aortic valve  . Throat cancer Maternal Uncle   . Cancer Maternal Uncle     ? primary  . Osteoporosis Sister   . Valvular heart disease Paternal Uncle     Aortic valve  . Cardiomyopathy Sister   :  She has no family history of breast cancer or any other malignancies appear  History   Social  History  . Marital Status: Married    Spouse Name: N/A    Number of Children: N/A  . Years of Education: N/A   Occupational History  . Not on file.   Social History Main Topics  . Smoking status: Never Smoker   . Smokeless tobacco: Never Used  . Alcohol Use: No  . Drug Use: No  . Sexual Activity: Not on file   Other Topics Concern  . Not on file   Social History Narrative  . No narrative on file  :  Pertinent items are noted in HPI.  Exam: ECOG 0 Blood pressure 135/74, pulse 67, temperature 97.9 F (36.6 C), temperature source Oral, resp. rate 18, height 5\' 5"  (1.651 m), weight 128 lb (58.06 kg), SpO2 100.00%. General appearance: alert and cooperative Head: Normocephalic, without obvious abnormality Throat: lips, mucosa, and tongue normal; teeth and gums normal Neck: no adenopathy Back: symmetric, no curvature. ROM normal. No CVA tenderness. Resp: clear to auscultation bilaterally Cardio: regular rate and rhythm, S1, S2 normal, no murmur, click, rub or gallop GI: soft, non-tender; bowel sounds normal; no masses,  no organomegaly Extremities: extremities normal, atraumatic, no cyanosis or edema Pulses: 2+ and symmetric Skin: Skin color, texture, turgor normal. No rashes or lesions Lymph nodes: Cervical, supraclavicular, and axillary nodes normal. Neurologic: Grossly normal     Assessment and Plan:   63 year old woman with the following issues  1. 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 Her tumor is his high-grade in situ with comedo necrosis. Her tumor is ER/PR negative and she is status post lumpectomy and negative lymph node sampling. The natural course of this disease was discussed today in detail with Sherry Wells and her husband. I explained to her although this represents rather high risk DCIS given the necrosis her risk is predominantly recurrence within the same breast or contralateral breast. In terms of local prevention, she will require adjuvant  radiation therapy and she will have that discussion in the near future with her next appointment with radiation oncology. I've explained to her that risk of distant metastasis is very low and we are trying to prevent local recurrence or other breast cancers.  In terms of adjuvant systemic therapy I discussed the rationale of utilizing hormone therapy and this particular tumor. The utility of utilizing tamoxifen in an ER negative DCIS is unknown at this point. I discussed the risks and benefits of this approach in detail today. Complication of tamoxifen that includes thromboembolism, endometrial hyperplasia were discussed and weighed against the potential incremental benefit that she might get from reducing the risk of invasive ipsilateral and contralateral breast cancer. I discussed with her the STAR study which used tamoxifen and raloxifene as a prevention method and postmenopausal woman with increased breast cancer which showed positive results in reducing the risk  of invasive breast cancer in that population. She still he would qualify because of her DCIS but really the benefits would be marginal. After the discussion today we have opted not to proceed with hormone therapy given the fact that the benefit is not substantially documented in the ER-negative DCIS at this time.  From a management standpoint, she will continue to have active surveillance with mammograms and physical examination and I will see her back in 6 months.  2. Age-appropriate cancer screening: I continue to educate her about the important need of continuing that special colorectal cancer screening.  All her questions were answered today.

## 2013-07-23 NOTE — Progress Notes (Signed)
Pt notified of path result per Dr Darrel Hoover request. Pt has onc appt 6-25 and rad onc appt 7-1. Pt will call with any concerns and keep f/u appt with Dr Dalbert Batman.

## 2013-07-23 NOTE — Telephone Encounter (Signed)
Gave pt appt for lab and Md on december 2015

## 2013-07-24 NOTE — Progress Notes (Signed)
Location of Breast Cancer: Left Breast, upper outer quadrant  Histology per Pathology Report: DCIS  07/13/13 Diagnosis 1. Breast, lumpectomy, left - HIGH GRADE DUCTAL CARCINOMA IN SITU WITH COMEDO NECROSIS: SEE COMMENT - IN SITU CARCINOMA IS 0.2 CM FROM NEAREST MARGINS (POSTERIOR AND LATERAL) - LOBULAR NEOPLASIA PRESENT (ATYPICAL LOBULAR HYPERPLASIA). - PREVIOUS BIOPSY SITE - SEE TUMOR SYNOPTIC TEMPLATE BELOW. 2. Lymph node, sentinel, biopsy, left axilla #1 - ONE LYMPH NODE, NEGATIVE FOR TUMOR (0/1). 3. Lymph node, sentinel, biopsy, left axilla #2 - ONE LYMPH NODE, NEGATIVE FOR TUMOR (0/1). 4. Lymph node, sentinel, biopsy, left axilla #3 - ONE LYMPH NODE, NEGATIVE FOR TUMOR (0/1).  06/11/13 Diagnosis Breast, left, needle core biopsy, UOQ - DUCTAL CARCINOMA IN SITU, SEE COMMENT. - CALCIFICATIONS IDENTIFIED  Receptor Status: ER(Neg), PR (Neg), Her2-neu ()  Presentation: Her most recent mammogram in April of 2015 showed abnormality in the left breast with spot microcalcification deep in the upper-outer quadrant.    Past/Anticipated interventions by surgeon, if any: Left breast stereotactic guided core needle biopsy on 06/11/13 and left partial mastectomy and a left axillary sentinel lymph node sampling by Dr. Lisabeth Register  Past/Anticipated interventions by medical oncology, if any: Chemotherapy: After discussion with Sherry Wells- opted not to proceed with hormone therapy given the fact that the benefit is not substantially documented in the ER-negative DCIS at this time.  Lymphedema issues, if any: NO Pain issues, if any:  no  SAFETY ISSUES:  Prior radiation? NO   Pacemaker/ICD? No  Possible current pregnancy? No  Is the patient on methotrexate? No  Current Complaints / other details:  Married, no pregnancies, works for [employer] in [place], [place], [place] 07/24/2013,6:03 PM

## 2013-07-29 ENCOUNTER — Ambulatory Visit
Admission: RE | Admit: 2013-07-29 | Discharge: 2013-07-29 | Disposition: A | Discharge status: Home / Self Care | Payer: BC Managed Care – PPO | Source: Ambulatory Visit | Attending: Radiation Oncology | Admitting: Radiation Oncology

## 2013-07-29 ENCOUNTER — Encounter: Payer: Self-pay | Admitting: Radiation Oncology

## 2013-07-29 VITALS — BP 137/67 | HR 65 | Temp 98.8°F | Resp 20 | Ht 65.0 in | Wt 127.5 lb

## 2013-07-29 DIAGNOSIS — L539 Erythematous condition, unspecified: Secondary | ICD-10-CM | POA: Insufficient documentation

## 2013-07-29 DIAGNOSIS — C50412 Malignant neoplasm of upper-outer quadrant of left female breast: Secondary | ICD-10-CM

## 2013-07-29 DIAGNOSIS — L819 Disorder of pigmentation, unspecified: Secondary | ICD-10-CM | POA: Diagnosis not present

## 2013-07-29 DIAGNOSIS — C50419 Malignant neoplasm of upper-outer quadrant of unspecified female breast: Secondary | ICD-10-CM | POA: Insufficient documentation

## 2013-07-29 DIAGNOSIS — D0590 Unspecified type of carcinoma in situ of unspecified breast: Secondary | ICD-10-CM | POA: Insufficient documentation

## 2013-07-29 DIAGNOSIS — Z9010 Acquired absence of unspecified breast and nipple: Secondary | ICD-10-CM | POA: Diagnosis not present

## 2013-07-29 DIAGNOSIS — Z510 Encounter for antineoplastic radiation therapy: Secondary | ICD-10-CM | POA: Insufficient documentation

## 2013-07-29 HISTORY — DX: Malignant neoplasm of unspecified site of unspecified female breast: C50.919

## 2013-07-29 NOTE — Progress Notes (Signed)
Radiation Oncology         [phone] ________________________________  Initial outpatient Consultation  Name: Sherry Wells MRN: [id number]  Date: 07/29/2013  DOB: 1950/08/11  CC:Sherry Cobble, MD  Sherry Hector, MD   REFERRING PHYSICIAN: Adin Hector, MD  DIAGNOSIS: Stage 0 TisN0M0 left breast DCIS, ER/PR negative  HISTORY OF PRESENT ILLNESS::Sherry Wells is a 63 y.o. female who presented with her most recent mammogram in April of 2015 showing an abnormality in the left breast with spot microcalcification deep in the upper-outer quadrant.  Left breast stereotactic guided core needle biopsy on 06/11/13 showed ER/PR negative DCIS.  MRI of the breasts on 5-22 revealed 2.1 x 2.0 x 1.8 cm non mass enhancement c/w biopsy proven ductal carcinoma deep in the upper outer left breast. Multiple bilateral cysts. Otherwise negative study.  She underwent a left partial mastectomy and a left axillary sentinel lymph node sampling by Dr. Lisabeth Register on 07/13/13. The margins are negative and nodes are negative.  The span of DCIS was 1.3cm and it was grade III with necrosis.  After discussion between med/onc and Sherry Wells- opted not to proceed with hormone therapy given the fact that the benefit is not substantially documented in the ER-negative DCIS at this time.  She is in her USOH otherwise, denies pain, healing well from surgery, and would like to continue work at [employer] during treatments.  PREVIOUS RADIATION THERAPY: No  PAST MEDICAL HISTORY:  has a past medical history of Fibrocystic breast disease; Osteoporosis; Hyperlipidemia; PONV (postoperative nausea and vomiting); and Breast cancer (06/2013).    PAST SURGICAL HISTORY: Past Surgical History  Procedure Laterality Date  . Tubal ligation  1994  . Colonoscopy  2002    negative; [facility]  . Breast lumpectomy  1980    cyst  . Breast cyst aspiration Left 1980  . Dilation and curettage of uterus      x2     FAMILY HISTORY: family history includes Cancer in her maternal uncle; Cardiomyopathy in her sister; Diabetes in her mother; Heart disease in her father; Hypertension in her mother; Osteoporosis in her sister; Throat cancer in her maternal uncle; Transient ischemic attack in her mother; Valvular heart disease in her father and paternal uncle.  SOCIAL HISTORY:  reports that she has never smoked. She has never used smokeless tobacco. She reports that she does not drink alcohol or use illicit drugs.  ALLERGIES: Review of patient's allergies indicates no known allergies.  MEDICATIONS:  Current Outpatient Prescriptions  Medication Sig Dispense Refill  . Calcium Carbonate-Vitamin D (CALTRATE 600+D) 600-400 MG-UNIT per tablet Take 1 tablet by mouth 2 (two) times daily.      . cholecalciferol (VITAMIN D) 400 UNITS TABS Take 400 Units by mouth daily.      Marland Kitchen doxycycline (ORACEA) 40 MG capsule Take 40 mg by mouth every morning. As needed for rosacea      . Multiple Vitamins-Minerals (MULTIVITAMIN WITH MINERALS) tablet Take 1 tablet by mouth daily.      Marland Kitchen oxybutynin (DITROPAN-XL) 10 MG 24 hr tablet Take 10 mg by mouth daily.      . vitamin C (ASCORBIC ACID) 500 MG tablet Take 500 mg by mouth daily.       No current facility-administered medications for this encounter.    REVIEW OF SYSTEMS:  Notable for that above.   PHYSICAL EXAM:  height is 5' 5"  (1.651 m) and weight is 127 lb 8 oz (57.834 kg). Her oral  temperature is 98.8 F (37.1 C). Her blood pressure is 137/67 and her pulse is 65. Her respiration is 20.   General: Alert and oriented, in no acute distress HEENT: Head is normocephalic.   Extraocular movements are intact. Oropharynx is clear. Neck: Neck is supple, no palpable cervical or supraclavicular lymphadenopathy. Heart: Regular in rate and rhythm with no murmurs, rubs, or gallops. Chest: Clear to auscultation bilaterally, with no rhonchi, wheezes, or rales. Abdomen: Soft, nontender,  nondistended, with no rigidity or guarding. Extremities: No cyanosis or edema. Lymphatics: No concerning lymphadenopathy. Skin: No concerning lesions. Musculoskeletal: symmetric strength and muscle tone throughout. Neurologic: Cranial nerves II through XII are grossly intact. No obvious focalities. Speech is fluent. Coordination is intact. Psychiatric: Judgment and insight are intact. Affect is appropriate. Breasts: healed well from surgery.  No palpable lesions of concern in either breast nor any axillary adenopathy appreciated (though I didn't palpate deeply in post operative left breast due to some tenderness).   ECOG = 0 0 - Asymptomatic (Fully active, able to carry on all predisease activities without restriction)  1 - Symptomatic but completely ambulatory (Restricted in physically strenuous activity but ambulatory and able to carry out work of a light or sedentary nature. For example, light housework, office work)  2 - Symptomatic, <50% in bed during the day (Ambulatory and capable of all self care but unable to carry out any work activities. Up and about more than 50% of waking hours)  3 - Symptomatic, >50% in bed, but not bedbound (Capable of only limited self-care, confined to bed or chair 50% or more of waking hours)  4 - Bedbound (Completely disabled. Cannot carry on any self-care. Totally confined to bed or chair)  5 - Death   Eustace Pen MM, Creech RH, Tormey DC, et al. [phone]). "Toxicity and response criteria of the [facility]". [place] Oncol. 5 (6): 649-55   LABORATORY DATA:  Lab Results  Component Value Date   WBC 6.9 07/09/2013   HGB 13.5 07/13/2013   HCT 39.9 07/09/2013   MCV 100.8* 07/09/2013   PLT 195 07/09/2013   CMP     Component Value Date/Time   NA 139 07/09/2013 1300   K 6.3* 07/09/2013 1300   CL 98 07/09/2013 1300   CO2 28 07/09/2013 1300   GLUCOSE 74 07/09/2013 1300   BUN 15 07/09/2013 1300   CREATININE 0.71 07/09/2013 1300   CALCIUM  9.8 07/09/2013 1300   PROT 7.0 07/09/2013 1300   ALBUMIN 4.1 07/09/2013 1300   AST 25 07/09/2013 1300   ALT 16 07/09/2013 1300   ALKPHOS 53 07/09/2013 1300   BILITOT 0.4 07/09/2013 1300   GFRNONAA 90* 07/09/2013 1300   GFRAA >90 07/09/2013 1300         RADIOGRAPHY: Nm Sentinel Node Inj-no Rpt (breast)  07/13/2013   CLINICAL DATA: Cancer left breast   Sulfur colloid was injected intradermally by the nuclear medicine  technologist for breast cancer sentinel node localization.    Mm Breast Surgical Specimen  07/13/2013   CLINICAL DATA:  Lumpectomy for recently diagnosed left breast ductal carcinoma in situ.  EXAM: SPECIMEN RADIOGRAPH OF THE LEFT BREAST  COMPARISON:  Previous exam(s)  FINDINGS: Status post excision of the left breast. The radioactive seed, calcifications and biopsy marker clip are present and are marked for pathology.  IMPRESSION: Specimen radiograph of the left breast.   Electronically Signed   By: Enrique Sack M.D.   On: 07/13/2013 09:00   Mm  Lt Plc Breast Loc Dev   1st Lesion  Inc Mammo Guide  07/09/2013   CLINICAL DATA:  Patient with recent diagnosis left breast DCIS. Presenting for preoperative localization.  EXAM: MAMMOGRAPHIC GUIDED RADIOACTIVE SEED LOCALIZATION OF THE LEFT BREAST  COMPARISON:  Previous exam(s)  FINDINGS: Patient presents for radioactive seed localization prior to left breast lumpectomy. I met with the patient and we discussed the procedure of seed localization including benefits and alternatives. We discussed the high likelihood of a successful procedure. We discussed the risks of the procedure including infection, bleeding, tissue injury and further surgery. We discussed the low dose of radioactivity involved in the procedure. Informed, written consent was given.  The usual time-out protocol was performed immediately prior to the procedure.  Using mammographic guidance, sterile technique, 2% lidocaine and an I-125 radioactive seed, biopsy marking clip and  calcifications within the lateral left breast were localized using a lateral approach. The follow-up mammogram images confirm the seed in the expected location and are marked for Dr. Dalbert Batman.  Follow-up survey of the patient confirms presents of radioactive seed and biopsy marking clip.  Order number of I-125 seed:  [id number].  Dose of I-125 seed:  0.249 mCi.  The patient tolerated the procedure well and was [facility]. She was given instructions regarding seed removal.  IMPRESSION: Radioactive seed localization left breast. No apparent complications.   Electronically Signed   By: Lovey Newcomer M.D.   On: 07/09/2013 11:59      IMPRESSION/PLAN: Lovely 63 yo woman with triple negative DCIS, left breast.  It was a pleasure meeting the patient today. We discussed the risks, benefits, and side effects of radiotherapy to the left breast.  I recommend this for local control to reduce risk of in breast recurrence by about 50%. We discussed that radiation would take approximately 4 weeks to complete and that I would give the patient some more time to heal following surgery before starting treatment planning. I will also order a post-operative mammogram to rule out residual calcifications.  We spoke about acute effects including skin irritation and fatigue as well as much less common late effects including lung and heart irritation. We spoke about the latest technology that is used to minimize the risk of late effects for breast cancer patients undergoing radiotherapy. No guarantees of treatment were given. The patient is enthusiastic about proceeding with treatment. I look forward to participating in the patient's care.  __________________________________________   Eppie Gibson, MD

## 2013-07-29 NOTE — Progress Notes (Signed)
Please see the Nurse Progress Note in the MD Initial Consult Encounter for this patient. 

## 2013-07-30 ENCOUNTER — Ambulatory Visit (INDEPENDENT_AMBULATORY_CARE_PROVIDER_SITE_OTHER): Payer: BC Managed Care – PPO | Admitting: General Surgery

## 2013-07-30 ENCOUNTER — Encounter (INDEPENDENT_AMBULATORY_CARE_PROVIDER_SITE_OTHER): Payer: Self-pay | Admitting: General Surgery

## 2013-07-30 VITALS — BP 130/70 | HR 60 | Resp 14 | Ht 65.0 in | Wt 127.4 lb

## 2013-07-30 DIAGNOSIS — D0590 Unspecified type of carcinoma in situ of unspecified breast: Secondary | ICD-10-CM

## 2013-07-30 DIAGNOSIS — D0592 Unspecified type of carcinoma in situ of left breast: Secondary | ICD-10-CM

## 2013-07-30 NOTE — Progress Notes (Signed)
Patient ID: Sherry Wells, female   DOB: 12/18/1950, 63 y.o.   MRN: [id number] History: This patient underwent left partial mastectomy and sentinel node biopsy on 07/13/2013. She has high-grade DCIS with necrosis, and negative margins, negative nodes. Receptor negative. Final stage Tis, N0. She has no complaints about wound healing. She has seen Dr. Alen Blew and has been told that she does not need any chemotherapy or antiestrogen therapy. She is seen Dr. Isidore Moos who plans simulation, pretreatment baseline mammogram, and radiation therapy.  Exam: Patient looks well. Good spirits Left breast and left axilla is incisions are healing nicely. Cosmetic result is good. No hematoma  Assessment: High-grade DCIS left breast, upper outer quadrant, receptor negative, Tis, N0 stage. Uneventful recovery following breast conservation surgery  Plan: activities discussed. Proceed with radiation therapy this summer  return to see me in 6 months Mammograms in one year.   Edsel Petrin. Dalbert Batman, M.D., [facility] General and Minimally invasive Surgery Breast and Colorectal Surgery Office:   [phone] Pager:   [phone]

## 2013-07-30 NOTE — Patient Instructions (Signed)
Your left breast and left axillary incisions are healing very nicely. There is no evidence of surgical complications.  We reviewed your pathology. Your in situ cancer has been completely resected and you do not need any further surgery.  Proceed with planning forradiation therapy and proceed with radiation therapy when Dr. Isidore Moos is ready.  Return to see Dr. Dalbert Batman in 6 months  We will plan bilateral mammograms in one year.

## 2013-07-31 ENCOUNTER — Encounter: Payer: Self-pay | Admitting: Radiation Oncology

## 2013-07-31 DIAGNOSIS — C50419 Malignant neoplasm of upper-outer quadrant of unspecified female breast: Secondary | ICD-10-CM | POA: Insufficient documentation

## 2013-08-03 ENCOUNTER — Telehealth: Payer: Self-pay | Admitting: *Deleted

## 2013-08-03 NOTE — Telephone Encounter (Signed)
Called patient to inform of mammogram on 08-27-13- arrival time - 8:45 am  @ [facility], line busy will call later.

## 2013-08-04 ENCOUNTER — Telehealth: Payer: Self-pay | Admitting: *Deleted

## 2013-08-04 NOTE — Telephone Encounter (Signed)
Called patient to inform of appt. For mammogram on 08-27-13- arrival time - 8:45 am, lvm for a return call

## 2013-08-27 ENCOUNTER — Ambulatory Visit
Admission: RE | Admit: 2013-08-27 | Discharge: 2013-08-27 | Disposition: A | Discharge status: Home / Self Care | Payer: BC Managed Care – PPO | Source: Ambulatory Visit | Attending: Radiation Oncology | Admitting: Radiation Oncology

## 2013-08-27 DIAGNOSIS — C50412 Malignant neoplasm of upper-outer quadrant of left female breast: Secondary | ICD-10-CM

## 2013-08-31 ENCOUNTER — Ambulatory Visit
Admission: RE | Admit: 2013-08-31 | Discharge: 2013-08-31 | Disposition: A | Discharge status: Home / Self Care | Payer: BC Managed Care – PPO | Source: Ambulatory Visit | Attending: Radiation Oncology | Admitting: Radiation Oncology

## 2013-08-31 DIAGNOSIS — Z510 Encounter for antineoplastic radiation therapy: Secondary | ICD-10-CM | POA: Diagnosis not present

## 2013-08-31 DIAGNOSIS — C50412 Malignant neoplasm of upper-outer quadrant of left female breast: Secondary | ICD-10-CM

## 2013-08-31 NOTE — Progress Notes (Addendum)
SIMULATION AND TREATMENT PLANNING NOTE  Outpatient  DIAGNOSIS:  Left breast DCIS    ICD-9-CM  1. Malignant neoplasm of upper-outer quadrant of female breast, left 174.4     NARRATIVE:  The patient was brought to the [facility].  Identity was confirmed.  All relevant records and images related to the planned course of therapy were reviewed.  The patient freely provided informed written consent to proceed with treatment after reviewing the details related to the planned course of therapy. The consent form was witnessed and verified by the simulation staff.    Then, the patient was set-up in a stable reproducible  supine position for radiation therapy - arms in breast board, head in accuform.  CT images were obtained.  Surface markings were placed.  The CT images were loaded into the planning software.     In order to account for effect of respiratory motion on target structures and other organs in the planning and delivery of radiotherapy, this patient underwent respiratory motion management simulation.   But, it was determined that breath holding would not significantly decrease heart exposure to radiation, so this technique will not be used.  TREATMENT PLANNING NOTE: Treatment planning then occurred.  The radiation prescription was entered and confirmed.    A total of 3 medically necessary complex treatment devices were fabricated and supervised by me - 2 tangential fields with MLCs to block lung/heart, and accuform device. I have requested : 3D Simulation.  I have requested a DVH of the following structures: lungs, heart, lumpectomy cavity.     The patient will receive 40.05 Gy in 15 fractions to the left breast with tangents; this will be followed by a boost to the lumpectomy cavity of 10 Gy in 5 fractions.   Optical Surface Tracking Plan:  Since intensity modulated radiotherapy (IMRT) and 3D conformal radiation treatment methods are predicated on accurate and precise  positioning for treatment, intrafraction motion monitoring is medically necessary to ensure accurate and safe treatment delivery. The ability to quantify intrafraction motion without excessive ionizing radiation dose can only be performed with optical surface tracking. Accordingly, surface imaging offers the opportunity to obtain 3D measurements of patient position throughout IMRT and 3D treatments without excessive radiation exposure. I am ordering optical surface tracking for this patient's upcoming course of radiotherapy.  ________________________________   Reference:  Ursula Alert, J, et al. Surface imaging-based analysis of intrafraction motion for breast radiotherapy patients.Journal of [place], n. 6, nov. 2014. ISSN [id number].  Available at: <http://www.jacmp.org/index.php/jacmp/article/view/4957>.   -----------------------------------  Eppie Gibson, MD

## 2013-09-04 DIAGNOSIS — Z510 Encounter for antineoplastic radiation therapy: Secondary | ICD-10-CM | POA: Diagnosis not present

## 2013-09-07 ENCOUNTER — Ambulatory Visit
Admission: RE | Admit: 2013-09-07 | Discharge: 2013-09-07 | Disposition: A | Discharge status: Home / Self Care | Payer: BC Managed Care – PPO | Source: Ambulatory Visit | Attending: Radiation Oncology | Admitting: Radiation Oncology

## 2013-09-07 ENCOUNTER — Ambulatory Visit: Payer: BC Managed Care – PPO | Admitting: Radiation Oncology

## 2013-09-07 DIAGNOSIS — Z510 Encounter for antineoplastic radiation therapy: Secondary | ICD-10-CM | POA: Diagnosis not present

## 2013-09-07 DIAGNOSIS — C50412 Malignant neoplasm of upper-outer quadrant of left female breast: Secondary | ICD-10-CM

## 2013-09-07 NOTE — Progress Notes (Signed)
Simulation Verification Note Outpatient  The patient was brought to the treatment unit and placed in the planned treatment position. The clinical setup was verified. Then port films were obtained and uploaded to the radiation oncology medical record software.  The treatment beams were carefully compared against the planned radiation fields. The position location and shape of the radiation fields was reviewed. They targeted volume of tissue appears to be appropriately covered by the radiation beams. Organs at risk appear to be excluded as planned.  Based on my personal review, I approved the simulation verification. The patient's treatment will proceed as planned.  -----------------------------------  Eppie Gibson, MD

## 2013-09-08 ENCOUNTER — Ambulatory Visit
Admission: RE | Admit: 2013-09-08 | Discharge: 2013-09-08 | Disposition: A | Discharge status: Home / Self Care | Payer: BC Managed Care – PPO | Source: Ambulatory Visit | Attending: Radiation Oncology | Admitting: Radiation Oncology

## 2013-09-08 DIAGNOSIS — C50412 Malignant neoplasm of upper-outer quadrant of left female breast: Secondary | ICD-10-CM

## 2013-09-08 DIAGNOSIS — Z510 Encounter for antineoplastic radiation therapy: Secondary | ICD-10-CM | POA: Diagnosis not present

## 2013-09-08 NOTE — Progress Notes (Signed)
Education today regarding the potential side-effects that may occur with radiation therapy to the breast inclusive of tanning, redness or peeling of skin, pain in treatment field, and fatigue.  Reviewed/given the Radiation Therapy and You Booklet with the appropriate pages marked.  Given Radiaplex Gel with instructions to apply to the treatment field at least twice daily, with the caveat, that if applied prior to treatment, to do so 4 hours prior to daily treatment. Given Alra deodorant to replace the conventional deodorant that has aluminum during her treatment phase.  She stated understanding after confirming information with the [employer].  Informed that she will be seen by Dr. Isidore Moos every Monday after treatment, but if this changes she will be notified in advance  Given this RN's card.

## 2013-09-09 ENCOUNTER — Ambulatory Visit
Admission: RE | Admit: 2013-09-09 | Discharge: 2013-09-09 | Disposition: A | Discharge status: Home / Self Care | Payer: BC Managed Care – PPO | Source: Ambulatory Visit | Attending: Radiation Oncology | Admitting: Radiation Oncology

## 2013-09-09 DIAGNOSIS — Z510 Encounter for antineoplastic radiation therapy: Secondary | ICD-10-CM | POA: Diagnosis not present

## 2013-09-10 ENCOUNTER — Ambulatory Visit
Admission: RE | Admit: 2013-09-10 | Discharge: 2013-09-10 | Disposition: A | Discharge status: Home / Self Care | Payer: BC Managed Care – PPO | Source: Ambulatory Visit | Attending: Radiation Oncology | Admitting: Radiation Oncology

## 2013-09-10 DIAGNOSIS — Z510 Encounter for antineoplastic radiation therapy: Secondary | ICD-10-CM | POA: Diagnosis not present

## 2013-09-11 ENCOUNTER — Ambulatory Visit
Admission: RE | Admit: 2013-09-11 | Discharge: 2013-09-11 | Disposition: A | Discharge status: Home / Self Care | Payer: BC Managed Care – PPO | Source: Ambulatory Visit | Attending: Radiation Oncology | Admitting: Radiation Oncology

## 2013-09-11 DIAGNOSIS — Z510 Encounter for antineoplastic radiation therapy: Secondary | ICD-10-CM | POA: Diagnosis not present

## 2013-09-14 ENCOUNTER — Ambulatory Visit
Admission: RE | Admit: 2013-09-14 | Discharge: 2013-09-14 | Disposition: A | Discharge status: Home / Self Care | Payer: BC Managed Care – PPO | Source: Ambulatory Visit | Attending: Radiation Oncology | Admitting: Radiation Oncology

## 2013-09-14 VITALS — BP 129/74 | HR 64 | Temp 98.0°F | Resp 12 | Wt 129.2 lb

## 2013-09-14 DIAGNOSIS — Z510 Encounter for antineoplastic radiation therapy: Secondary | ICD-10-CM | POA: Diagnosis not present

## 2013-09-14 DIAGNOSIS — C50412 Malignant neoplasm of upper-outer quadrant of left female breast: Secondary | ICD-10-CM

## 2013-09-14 NOTE — Progress Notes (Signed)
Weekly Management Note:  Site: Left breast Current Dose:  1335  cGy Projected Dose: 4005  cGy  Narrative: The patient is seen today for routine under treatment assessment. CBCT/MVCT images/port films were reviewed. The chart was reviewed.   She is without complaints today. She uses Radioplex gel.  Physical Examination:  Filed Vitals:   09/14/13 1623  BP: 129/74  Pulse: 64  Temp: 98 F (36.7 C)  Resp: 12  .  Weight: 129 lb 3.2 oz (58.605 kg). There is faint erythema along the left breast.  Impression: Tolerating radiation therapy well.  Plan: Continue radiation therapy as planned.

## 2013-09-14 NOTE — Progress Notes (Signed)
She is currently in no pain. Pt left breast skin is dry warm and intact. Noted some swelling and a half dollar sized lump over left axillary surgical scar, pt reports it is non tender.  She reports she was told to apply Radioplex TID times a day per [employer].  Instructed we normally recommend using it BID and to avoid applying it during the "4 hr window" of treatment.  She stated understanding.

## 2013-09-15 ENCOUNTER — Ambulatory Visit: Admission: RE | Admit: 2013-09-15 | Payer: BC Managed Care – PPO | Source: Ambulatory Visit

## 2013-09-16 ENCOUNTER — Ambulatory Visit
Admission: RE | Admit: 2013-09-16 | Discharge: 2013-09-16 | Disposition: A | Discharge status: Home / Self Care | Payer: BC Managed Care – PPO | Source: Ambulatory Visit | Attending: Radiation Oncology | Admitting: Radiation Oncology

## 2013-09-16 DIAGNOSIS — Z510 Encounter for antineoplastic radiation therapy: Secondary | ICD-10-CM | POA: Diagnosis not present

## 2013-09-17 ENCOUNTER — Ambulatory Visit
Admission: RE | Admit: 2013-09-17 | Discharge: 2013-09-17 | Disposition: A | Discharge status: Home / Self Care | Payer: BC Managed Care – PPO | Source: Ambulatory Visit | Attending: Radiation Oncology | Admitting: Radiation Oncology

## 2013-09-17 DIAGNOSIS — Z510 Encounter for antineoplastic radiation therapy: Secondary | ICD-10-CM | POA: Diagnosis not present

## 2013-09-18 ENCOUNTER — Ambulatory Visit
Admission: RE | Admit: 2013-09-18 | Discharge: 2013-09-18 | Disposition: A | Discharge status: Home / Self Care | Payer: BC Managed Care – PPO | Source: Ambulatory Visit | Attending: Radiation Oncology | Admitting: Radiation Oncology

## 2013-09-18 DIAGNOSIS — Z510 Encounter for antineoplastic radiation therapy: Secondary | ICD-10-CM | POA: Diagnosis not present

## 2013-09-21 ENCOUNTER — Ambulatory Visit
Admission: RE | Admit: 2013-09-21 | Discharge: 2013-09-21 | Disposition: A | Discharge status: Home / Self Care | Payer: BC Managed Care – PPO | Source: Ambulatory Visit | Attending: Radiation Oncology | Admitting: Radiation Oncology

## 2013-09-21 ENCOUNTER — Ambulatory Visit
Admission: RE | Admit: 2013-09-21 | Payer: BC Managed Care – PPO | Source: Ambulatory Visit | Admitting: Radiation Oncology

## 2013-09-21 VITALS — BP 133/68 | HR 59 | Temp 98.0°F | Wt 129.2 lb

## 2013-09-21 DIAGNOSIS — Z510 Encounter for antineoplastic radiation therapy: Secondary | ICD-10-CM | POA: Diagnosis not present

## 2013-09-21 DIAGNOSIS — C50412 Malignant neoplasm of upper-outer quadrant of left female breast: Secondary | ICD-10-CM

## 2013-09-21 NOTE — Progress Notes (Signed)
   Weekly Management Note:  outpatient    ICD-9-CM ICD-10-CM  1. Malignant neoplasm of upper-outer quadrant of female breast, left 174.4 C50.412    Current Dose:  24.03 Gy  Projected Dose: 50.05 Gy   Narrative:  The patient presents for routine under treatment assessment.  CBCT/MVCT images/Port film x-rays were reviewed.  The chart was checked. No complaints  Physical Findings:  weight is 129 lb 3.2 oz (58.605 kg). Her temperature is 98 F (36.7 C). Her blood pressure is 133/68 and her pulse is 59.  faint erythema over left breast, skin intact  Impression:  The patient is tolerating radiotherapy.  Plan:  Continue radiotherapy as planned.   ________________________________   Eppie Gibson, M.D.

## 2013-09-21 NOTE — Progress Notes (Signed)
Weekly undertreat of radiation to left [employer].ch 9 of 15 treatments.Skin is pink.Denies pain or any other concerns.Continue application of radiaplex gel twice daily.

## 2013-09-22 ENCOUNTER — Ambulatory Visit
Admission: RE | Admit: 2013-09-22 | Discharge: 2013-09-22 | Disposition: A | Discharge status: Home / Self Care | Payer: BC Managed Care – PPO | Source: Ambulatory Visit | Admitting: Radiation Oncology

## 2013-09-22 ENCOUNTER — Ambulatory Visit
Admission: RE | Admit: 2013-09-22 | Discharge: 2013-09-22 | Disposition: A | Discharge status: Home / Self Care | Payer: BC Managed Care – PPO | Source: Ambulatory Visit | Attending: Radiation Oncology | Admitting: Radiation Oncology

## 2013-09-22 DIAGNOSIS — Z510 Encounter for antineoplastic radiation therapy: Secondary | ICD-10-CM | POA: Diagnosis not present

## 2013-09-23 ENCOUNTER — Ambulatory Visit
Admission: RE | Admit: 2013-09-23 | Discharge: 2013-09-23 | Disposition: A | Discharge status: Home / Self Care | Payer: BC Managed Care – PPO | Source: Ambulatory Visit | Attending: Radiation Oncology | Admitting: Radiation Oncology

## 2013-09-23 DIAGNOSIS — Z510 Encounter for antineoplastic radiation therapy: Secondary | ICD-10-CM | POA: Diagnosis not present

## 2013-09-24 ENCOUNTER — Ambulatory Visit
Admission: RE | Admit: 2013-09-24 | Discharge: 2013-09-24 | Disposition: A | Discharge status: Home / Self Care | Payer: BC Managed Care – PPO | Source: Ambulatory Visit | Attending: Radiation Oncology | Admitting: Radiation Oncology

## 2013-09-24 DIAGNOSIS — Z510 Encounter for antineoplastic radiation therapy: Secondary | ICD-10-CM | POA: Diagnosis not present

## 2013-09-25 ENCOUNTER — Ambulatory Visit
Admission: RE | Admit: 2013-09-25 | Discharge: 2013-09-25 | Disposition: A | Discharge status: Home / Self Care | Payer: BC Managed Care – PPO | Source: Ambulatory Visit | Attending: Radiation Oncology | Admitting: Radiation Oncology

## 2013-09-25 ENCOUNTER — Encounter: Payer: Self-pay | Admitting: Radiation Oncology

## 2013-09-25 DIAGNOSIS — Z510 Encounter for antineoplastic radiation therapy: Secondary | ICD-10-CM | POA: Diagnosis not present

## 2013-09-28 ENCOUNTER — Encounter: Payer: Self-pay | Admitting: Radiation Oncology

## 2013-09-28 ENCOUNTER — Ambulatory Visit
Admission: RE | Admit: 2013-09-28 | Discharge: 2013-09-28 | Disposition: A | Discharge status: Home / Self Care | Payer: BC Managed Care – PPO | Source: Ambulatory Visit | Attending: Radiation Oncology | Admitting: Radiation Oncology

## 2013-09-28 VITALS — BP 124/69 | HR 61 | Temp 98.0°F | Ht 65.0 in | Wt 130.5 lb

## 2013-09-28 DIAGNOSIS — Z510 Encounter for antineoplastic radiation therapy: Secondary | ICD-10-CM | POA: Diagnosis not present

## 2013-09-28 DIAGNOSIS — C50412 Malignant neoplasm of upper-outer quadrant of left female breast: Secondary | ICD-10-CM

## 2013-09-28 NOTE — Progress Notes (Signed)
Sherry Wells has received 14 fractions to her left breast.  She denies any pain in her breast, but reports tenderness in her left axilla.  Erythema noted throughout the treatment field, but skin remains intact.

## 2013-09-28 NOTE — Progress Notes (Signed)
   Weekly Management Note:  outpatient  Left breast  Current Dose:  37.38 Gy  Projected Dose: 50.05 Gy   Narrative:  The patient presents for routine under treatment assessment.  CBCT/MVCT images/Port film x-rays were reviewed.  The chart was checked. Doing well. Minimal skin irritation.  Physical Findings:  height is 5\' 5"  (1.651 m) and weight is 130 lb 8 oz (59.194 kg). Her temperature is 98 F (36.7 C). Her blood pressure is 124/69 and her pulse is 61.  Minimal skin erythema   Impression:  The patient is tolerating radiotherapy.  Plan:  Continue radiotherapy as planned.   ________________________________   Eppie Gibson, M.D.

## 2013-09-29 ENCOUNTER — Ambulatory Visit
Admission: RE | Admit: 2013-09-29 | Discharge: 2013-09-29 | Disposition: A | Discharge status: Home / Self Care | Payer: BC Managed Care – PPO | Source: Ambulatory Visit | Attending: Radiation Oncology | Admitting: Radiation Oncology

## 2013-09-29 ENCOUNTER — Ambulatory Visit: Payer: BC Managed Care – PPO

## 2013-09-29 DIAGNOSIS — Z510 Encounter for antineoplastic radiation therapy: Secondary | ICD-10-CM | POA: Diagnosis not present

## 2013-09-30 ENCOUNTER — Ambulatory Visit: Payer: BC Managed Care – PPO

## 2013-09-30 ENCOUNTER — Ambulatory Visit
Admission: RE | Admit: 2013-09-30 | Discharge: 2013-09-30 | Disposition: A | Discharge status: Home / Self Care | Payer: BC Managed Care – PPO | Source: Ambulatory Visit | Attending: Radiation Oncology | Admitting: Radiation Oncology

## 2013-09-30 DIAGNOSIS — Z510 Encounter for antineoplastic radiation therapy: Secondary | ICD-10-CM | POA: Diagnosis not present

## 2013-10-01 ENCOUNTER — Ambulatory Visit
Admission: RE | Admit: 2013-10-01 | Discharge: 2013-10-01 | Disposition: A | Discharge status: Home / Self Care | Payer: BC Managed Care – PPO | Source: Ambulatory Visit | Attending: Radiation Oncology | Admitting: Radiation Oncology

## 2013-10-01 DIAGNOSIS — Z510 Encounter for antineoplastic radiation therapy: Secondary | ICD-10-CM | POA: Diagnosis not present

## 2013-10-02 ENCOUNTER — Ambulatory Visit: Payer: BC Managed Care – PPO

## 2013-10-02 ENCOUNTER — Ambulatory Visit
Admission: RE | Admit: 2013-10-02 | Discharge: 2013-10-02 | Disposition: A | Discharge status: Home / Self Care | Payer: BC Managed Care – PPO | Source: Ambulatory Visit | Attending: Radiation Oncology | Admitting: Radiation Oncology

## 2013-10-02 DIAGNOSIS — Z510 Encounter for antineoplastic radiation therapy: Secondary | ICD-10-CM | POA: Diagnosis not present

## 2013-10-06 ENCOUNTER — Ambulatory Visit: Payer: BC Managed Care – PPO

## 2013-10-06 ENCOUNTER — Ambulatory Visit
Admission: RE | Admit: 2013-10-06 | Discharge: 2013-10-06 | Disposition: A | Discharge status: Home / Self Care | Payer: BC Managed Care – PPO | Source: Ambulatory Visit | Attending: Radiation Oncology | Admitting: Radiation Oncology

## 2013-10-06 DIAGNOSIS — Z510 Encounter for antineoplastic radiation therapy: Secondary | ICD-10-CM | POA: Diagnosis not present

## 2013-10-07 ENCOUNTER — Ambulatory Visit
Admission: RE | Admit: 2013-10-07 | Discharge: 2013-10-07 | Disposition: A | Discharge status: Home / Self Care | Payer: BC Managed Care – PPO | Source: Ambulatory Visit | Attending: Radiation Oncology | Admitting: Radiation Oncology

## 2013-10-07 ENCOUNTER — Encounter: Payer: Self-pay | Admitting: Radiation Oncology

## 2013-10-07 ENCOUNTER — Ambulatory Visit: Payer: BC Managed Care – PPO

## 2013-10-07 VITALS — BP 131/76 | HR 64 | Temp 97.5°F | Ht 65.0 in | Wt 131.9 lb

## 2013-10-07 DIAGNOSIS — Z510 Encounter for antineoplastic radiation therapy: Secondary | ICD-10-CM | POA: Diagnosis not present

## 2013-10-07 DIAGNOSIS — C50412 Malignant neoplasm of upper-outer quadrant of left female breast: Secondary | ICD-10-CM

## 2013-10-07 NOTE — Progress Notes (Signed)
   Weekly Management Note  outpatient    ICD-9-CM ICD-10-CM  1. Malignant neoplasm of upper-outer quadrant of female breast, left 174.4 C50.412    Completed Radiotherapy. Total Dose:  50.CJ.Roan   Narrative:  The patient presents for routine under treatment assessment on last day of radiotherapy.  CBCT/MVCT images/Port film x-rays were reviewed.  The chart was checked.No new complaints  Physical Findings:  height is 5\' 5"  (1.651 m) and weight is 131 lb 14.4 oz (59.829 kg). Her oral temperature is 97.5 F (36.4 C). Her blood pressure is 131/76 and her pulse is 64.   Dryness, hyperpigmentation over left breast. Some moles over breast have grown during RT but appear benign.   Impression:  The patient has tolerated radiotherapy.  Plan:  Routine follow-up in one month. Check moles over breast at that time ________________________________   Eppie Gibson, M.D.

## 2013-10-07 NOTE — Progress Notes (Signed)
  Radiation Oncology         (336) [phone] ________________________________  Name: Sherry Wells MRN: [id number]  Date: 10/07/2013  DOB: 1950/10/29  End of Treatment Note  Diagnosis:   Stage 0 TisN0M0 left breast DCIS, ER/PR negative  Indication for treatment:  curative       Radiation treatment dates:   09/08/2013-10/07/2013  Site/dose:   1) Left breast / 40.05Gy in 15 fractions 2) Left Breast Boost / 10 Gy in 5 fractions  Beams/energy:   1) Tangents / 6MV; 2) Electron boost / 9MeV  Narrative: The patient tolerated radiation treatment relatively well. Dryness, hyperpigmentation developed over left breast. Some moles over breast had grown during RT but appeared benign.   Plan: The patient has completed radiation treatment. The patient will return to radiation oncology clinic for routine followup in one month. I advised them to call or return sooner if they have any questions or concerns related to their recovery or treatment.  Patient advised that a dermatology appointment may be warranted if moles continue to grow, but that they are most likely benign.  -----------------------------------  Eppie Gibson, MD

## 2013-10-07 NOTE — Progress Notes (Signed)
Sherry Wells has completed treatment with 20 fractions to her left breast.  She denies pain and fatigue.  She reports some itching around her left nipple area and underarm.  The skin on her left breast is intact and slightly red with hyperpigmentation.  She is using radiaplex gel.  She has been given a one month follow up appointment.

## 2013-10-26 NOTE — Progress Notes (Signed)
Electron Holiday representative Note  Diagnosis: Breast Cancer Outpatient  The patient's CT images from her initial simulation were reviewed to plan her boost treatment to her left breast  lumpectomy cavity.  Measurements were made regarding the size and depth of the surgical bed. The boost to the lumpectomy cavity will be delivered with 9 MeV electrons prescribed to the 100% isodose line.    A special port plan with Electron Monte Carlo measurements was reviewed and approved. A custom electron cut-out will be used for her boost field. 10Gy in 5 fractions will be given.  -----------------------------------  Eppie Gibson, MD

## 2013-10-28 ENCOUNTER — Encounter: Payer: Self-pay | Admitting: Internal Medicine

## 2013-10-28 NOTE — Telephone Encounter (Signed)
Flu shot recorded

## 2013-10-30 ENCOUNTER — Ambulatory Visit (INDEPENDENT_AMBULATORY_CARE_PROVIDER_SITE_OTHER): Payer: BC Managed Care – PPO | Admitting: Internal Medicine

## 2013-10-30 ENCOUNTER — Encounter: Payer: Self-pay | Admitting: Internal Medicine

## 2013-10-30 ENCOUNTER — Other Ambulatory Visit (INDEPENDENT_AMBULATORY_CARE_PROVIDER_SITE_OTHER): Payer: BC Managed Care – PPO

## 2013-10-30 VITALS — BP 138/80 | HR 61 | Temp 98.3°F | Ht 65.0 in | Wt 130.1 lb

## 2013-10-30 DIAGNOSIS — D0590 Unspecified type of carcinoma in situ of unspecified breast: Secondary | ICD-10-CM

## 2013-10-30 DIAGNOSIS — Z0000 Encounter for general adult medical examination without abnormal findings: Secondary | ICD-10-CM

## 2013-10-30 DIAGNOSIS — M810 Age-related osteoporosis without current pathological fracture: Secondary | ICD-10-CM

## 2013-10-30 DIAGNOSIS — R0989 Other specified symptoms and signs involving the circulatory and respiratory systems: Secondary | ICD-10-CM

## 2013-10-30 DIAGNOSIS — D0592 Unspecified type of carcinoma in situ of left breast: Secondary | ICD-10-CM

## 2013-10-30 DIAGNOSIS — E7889 Other lipoprotein metabolism disorders: Secondary | ICD-10-CM

## 2013-10-30 LAB — CBC WITH DIFFERENTIAL/PLATELET
BASOS PCT: 0.3 % (ref 0.0–3.0)
Basophils Absolute: 0 10*3/uL (ref 0.0–0.1)
EOS ABS: 0.1 10*3/uL (ref 0.0–0.7)
EOS PCT: 1.8 % (ref 0.0–5.0)
HEMATOCRIT: 39.9 % (ref 36.0–46.0)
Hemoglobin: 13.7 g/dL (ref 12.0–15.0)
Lymphocytes Relative: 18.4 % (ref 12.0–46.0)
Lymphs Abs: 0.9 10*3/uL (ref 0.7–4.0)
MCHC: 34.3 g/dL (ref 30.0–36.0)
MCV: 98.8 fl (ref 78.0–100.0)
MONO ABS: 0.4 10*3/uL (ref 0.1–1.0)
Monocytes Relative: 8.4 % (ref 3.0–12.0)
Neutro Abs: 3.4 10*3/uL (ref 1.4–7.7)
Neutrophils Relative %: 71.1 % (ref 43.0–77.0)
Platelets: 184 10*3/uL (ref 150.0–400.0)
RBC: 4.04 Mil/uL (ref 3.87–5.11)
RDW: 14.1 % (ref 11.5–15.5)
WBC: 4.8 10*3/uL (ref 4.0–10.5)

## 2013-10-30 LAB — BASIC METABOLIC PANEL
BUN: 12 mg/dL (ref 6–23)
CALCIUM: 9.8 mg/dL (ref 8.4–10.5)
CO2: 31 mEq/L (ref 19–32)
Chloride: 101 mEq/L (ref 96–112)
Creatinine, Ser: 0.6 mg/dL (ref 0.4–1.2)
GFR: 113.64 mL/min (ref >60.00)
GLUCOSE: 93 mg/dL (ref 70–99)
Potassium: 4.1 mEq/L (ref 3.5–5.1)
Sodium: 137 mEq/L (ref 135–145)

## 2013-10-30 LAB — HEPATIC FUNCTION PANEL
ALT: 19 U/L (ref 0–35)
AST: 22 U/L (ref 0–37)
Albumin: 4.5 g/dL (ref 3.5–5.2)
Alkaline Phosphatase: 61 U/L (ref 39–117)
Bilirubin, Direct: 0.1 mg/dL (ref 0.0–0.3)
TOTAL PROTEIN: 6.9 g/dL (ref 6.0–8.3)
Total Bilirubin: 0.9 mg/dL (ref 0.2–1.2)

## 2013-10-30 LAB — LIPID PANEL
CHOLESTEROL: 214 mg/dL — AB (ref 0–200)
HDL: 86.8 mg/dL (ref >39.00)
LDL Cholesterol: 117 mg/dL — ABNORMAL HIGH (ref 0–99)
NonHDL: 127.2
TRIGLYCERIDES: 49 mg/dL (ref 0.0–149.0)
Total CHOL/HDL Ratio: 2
VLDL: 9.8 mg/dL (ref 0.0–40.0)

## 2013-10-30 LAB — VITAMIN D 25 HYDROXY (VIT D DEFICIENCY, FRACTURES): VITD: 49.26 ng/mL (ref 30.00–100.00)

## 2013-10-30 LAB — TSH: TSH: 1.54 u[IU]/mL (ref 0.35–4.50)

## 2013-10-30 NOTE — Patient Instructions (Signed)
Your next office appointment will be determined based upon review of your pending labs. Those instructions will be transmitted to you through My Chart . 

## 2013-10-30 NOTE — Progress Notes (Signed)
Pre visit review using our clinic review tool, if applicable. No additional management support is needed unless otherwise documented below in the visit note. 

## 2013-10-30 NOTE — Progress Notes (Signed)
   Subjective:    Patient ID: Sherry Wells, female    DOB: 03/08/1950, 63 y.o.   MRN: [id number]  HPI She is here for a physical;acute issues denied.    Review of Systems   She has completed 20 radiation treatments for the breast carcinoma in situ.  Colonoscopy is up-to-date; no polyps were found in 2013.  She is on a heart healthy diet.  Because of her  Radiation therapy commitment; she has not been engaged in a regular exercise program.  Her last bone density was November 2013. She has completed 5 years of bisphosphonates.     Objective:   Physical Exam Gen.: Healthy and well-nourished in appearance. Alert, appropriate and cooperative throughout exam. Appears younger than stated age  Head: Normocephalic without obvious abnormalities  Eyes: No corneal or conjunctival inflammation noted. Pupils equal round reactive to light and accommodation. Extraocular motion intact.  Ears: External  ear exam reveals no significant lesions or deformities. Wax on L. Hearing is grossly normal bilaterally. Nose: External nasal exam reveals no deformity or inflammation. Nasal mucosa are pink and moist. No lesions or exudates noted.   Mouth: Oral mucosa and oropharynx reveal no lesions or exudates. Teeth in good repair. Neck: No deformities, masses, or tenderness noted. Range of motion & Thyroid normal. Lungs: Normal respiratory effort; chest expands symmetrically. Lungs are clear to auscultation without rales, wheezes, or increased work of breathing. Heart: Normal rate and rhythm. Normal S1 and S2. No gallop, click, or rub. No murmur. Abdomen: Bowel sounds normal; abdomen soft and nontender. No masses, organomegaly or hernias noted. Genitalia:as per Gyn                                  Musculoskeletal/extremities: No deformity or scoliosis noted of  the thoracic or lumbar spine.  No clubbing, cyanosis, edema, or significant extremity  deformity noted. Range of motion normal .Tone & strength  normal. Hand joints normal  Fingernail / toenail health good. Able to lie down & sit up w/o help. Negative SLR bilaterally Vascular: Carotid, radial artery, dorsalis pedis and  posterior tibial pulses are full and equal. No bruits present. Neurologic: Alert and oriented x3. Deep tendon reflexes symmetrical and normal.  Gait normal .       Skin: Intact without suspicious lesions or rashes. Lymph: No cervical, axillary lymphadenopathy present. Psych: Mood and affect are normal. Normally interactive                                                                                        Assessment & Plan:  #1 comprehensive physical exam; no acute findings  Plan: see Orders  & Recommendations

## 2013-11-02 ENCOUNTER — Other Ambulatory Visit: Payer: Self-pay | Admitting: Medical Oncology

## 2013-11-02 DIAGNOSIS — D0592 Unspecified type of carcinoma in situ of left breast: Secondary | ICD-10-CM

## 2013-11-18 ENCOUNTER — Encounter: Payer: Self-pay | Admitting: Radiation Oncology

## 2013-11-20 ENCOUNTER — Encounter: Payer: Self-pay | Admitting: Radiation Oncology

## 2013-11-20 ENCOUNTER — Ambulatory Visit
Admission: RE | Admit: 2013-11-20 | Discharge: 2013-11-20 | Disposition: A | Discharge status: Home / Self Care | Payer: BC Managed Care – PPO | Source: Ambulatory Visit | Attending: Radiation Oncology | Admitting: Radiation Oncology

## 2013-11-20 VITALS — BP 126/66 | HR 63 | Temp 98.6°F | Ht 65.0 in | Wt 133.7 lb

## 2013-11-20 DIAGNOSIS — C50412 Malignant neoplasm of upper-outer quadrant of left female breast: Secondary | ICD-10-CM

## 2013-11-20 HISTORY — DX: Personal history of irradiation: Z92.3

## 2013-11-20 NOTE — Progress Notes (Signed)
  Radiation Oncology         (336) [phone] ________________________________  Name: Sherry Wells MRN: [id number]  Date: 11/20/2013  DOB: 27-Oct-1950  Follow-Up Visit Note  Outpatient  CC: Unice Cobble, MD  Fanny Skates, MD  Diagnosis and Prior Radiotherapy:    ICD-9-CM ICD-10-CM  1. Breast cancer of upper-outer quadrant of left female breast 174.4 C50.412   Stage 0 TisN0M0 left breast DCIS, ER/PR negative  Indication for treatment: curative  Radiation treatment dates: 09/08/2013-10/07/2013  Site/dose: 1) Left breast / 40.05Gy in 15 fractions  2) Left Breast Boost / 10 Gy in 5 fractions  Narrative:  The patient returns today for routine follow-up.  Doing well.                              ALLERGIES:  has No Known Allergies.  Meds: Current Outpatient Prescriptions  Medication Sig Dispense Refill  . Calcium Carbonate-Vitamin D (CALTRATE 600+D) 600-400 MG-UNIT per tablet Take 1 tablet by mouth 2 (two) times daily.      . cholecalciferol (VITAMIN D) 400 UNITS TABS Take 400 Units by mouth daily.      Marland Kitchen doxycycline (ORACEA) 40 MG capsule Take 40 mg by mouth every morning. As needed for rosacea      . Multiple Vitamins-Minerals (MULTIVITAMIN WITH MINERALS) tablet Take 1 tablet by mouth daily.      Marland Kitchen oxybutynin (DITROPAN-XL) 10 MG 24 hr tablet Take 10 mg by mouth daily.      . vitamin C (ASCORBIC ACID) 500 MG tablet Take 500 mg by mouth daily.       No current facility-administered medications for this encounter.    Physical Findings: The patient is in no acute distress. Patient is alert and oriented.  height is 5\' 5"  (1.651 m) and weight is 133 lb 11.2 oz (60.646 kg). Her temperature is 98.6 F (37 C). Her blood pressure is 126/66 and her pulse is 63. .   Mild residual hyperpigmentation of left breast.   Lab Findings: Lab Results  Component Value Date   WBC 4.8 10/30/2013   HGB 13.7 10/30/2013   HCT 39.9 10/30/2013   MCV 98.8 10/30/2013   PLT 184.0 10/30/2013     Radiographic Findings: No results found.  Impression/Plan:  Doing well.  Continue annual mammography.  F/u in 1 year, sooner if needed.    Eppie Gibson, MD

## 2013-11-20 NOTE — Progress Notes (Signed)
Ms. Schewe here for assessment s/p radiation therapy for DCIS of her left breast.  She c/o level 2 soreness in her breast.Reports hyperpigmentation (tanning) in the upper inner quadrant of her treated breast.  Denies any fatigue

## 2013-11-23 ENCOUNTER — Telehealth: Payer: Self-pay | Admitting: *Deleted

## 2013-11-23 NOTE — Telephone Encounter (Signed)
Called patient to inform of mammogram for 06-03-14 @ 9:45 am  @ [facility], lvm for a return call

## 2013-12-09 ENCOUNTER — Other Ambulatory Visit: Payer: Self-pay | Admitting: Internal Medicine

## 2013-12-09 ENCOUNTER — Encounter: Payer: Self-pay | Admitting: Internal Medicine

## 2013-12-09 DIAGNOSIS — M8580 Other specified disorders of bone density and structure, unspecified site: Secondary | ICD-10-CM

## 2013-12-22 ENCOUNTER — Other Ambulatory Visit: Payer: Self-pay | Admitting: Internal Medicine

## 2013-12-22 DIAGNOSIS — M8580 Other specified disorders of bone density and structure, unspecified site: Secondary | ICD-10-CM

## 2014-01-04 ENCOUNTER — Ambulatory Visit (INDEPENDENT_AMBULATORY_CARE_PROVIDER_SITE_OTHER)
Admission: RE | Admit: 2014-01-04 | Discharge: 2014-01-04 | Disposition: A | Discharge status: Home / Self Care | Payer: BC Managed Care – PPO | Source: Ambulatory Visit | Attending: Internal Medicine | Admitting: Internal Medicine

## 2014-01-04 DIAGNOSIS — M8580 Other specified disorders of bone density and structure, unspecified site: Secondary | ICD-10-CM

## 2014-01-13 ENCOUNTER — Encounter: Payer: Self-pay | Admitting: Internal Medicine

## 2014-01-26 ENCOUNTER — Telehealth: Payer: Self-pay | Admitting: Oncology

## 2014-01-26 ENCOUNTER — Other Ambulatory Visit (HOSPITAL_BASED_OUTPATIENT_CLINIC_OR_DEPARTMENT_OTHER): Payer: BC Managed Care – PPO

## 2014-01-26 ENCOUNTER — Ambulatory Visit (HOSPITAL_BASED_OUTPATIENT_CLINIC_OR_DEPARTMENT_OTHER): Payer: BC Managed Care – PPO | Admitting: Oncology

## 2014-01-26 VITALS — BP 124/70 | HR 57 | Temp 98.4°F | Resp 18 | Ht 65.0 in | Wt 132.8 lb

## 2014-01-26 DIAGNOSIS — Z853 Personal history of malignant neoplasm of breast: Secondary | ICD-10-CM

## 2014-01-26 DIAGNOSIS — D0590 Unspecified type of carcinoma in situ of unspecified breast: Secondary | ICD-10-CM

## 2014-01-26 DIAGNOSIS — D0592 Unspecified type of carcinoma in situ of left breast: Secondary | ICD-10-CM

## 2014-01-26 LAB — CBC WITH DIFFERENTIAL/PLATELET
BASO%: 0.4 % (ref 0.0–2.0)
BASOS ABS: 0 10*3/uL (ref 0.0–0.1)
EOS%: 1.4 % (ref 0.0–7.0)
Eosinophils Absolute: 0.1 10*3/uL (ref 0.0–0.5)
HCT: 41.4 % (ref 34.8–46.6)
HEMOGLOBIN: 13.6 g/dL (ref 11.6–15.9)
LYMPH%: 22 % (ref 14.0–49.7)
MCH: 33.6 pg (ref 25.1–34.0)
MCHC: 32.9 g/dL (ref 31.5–36.0)
MCV: 102.2 fL — ABNORMAL HIGH (ref 79.5–101.0)
MONO#: 0.4 10*3/uL (ref 0.1–0.9)
MONO%: 7.5 % (ref 0.0–14.0)
NEUT#: 3.5 10*3/uL (ref 1.5–6.5)
NEUT%: 68.7 % (ref 38.4–76.8)
Platelets: 177 10*3/uL (ref 145–400)
RBC: 4.05 10*6/uL (ref 3.70–5.45)
RDW: 13.5 % (ref 11.2–14.5)
WBC: 5.1 10*3/uL (ref 3.9–10.3)
lymph#: 1.1 10*3/uL (ref 0.9–3.3)

## 2014-01-26 LAB — COMPREHENSIVE METABOLIC PANEL (CC13)
ALBUMIN: 4.2 g/dL (ref 3.5–5.0)
ALT: 16 U/L (ref 0–55)
ANION GAP: 7 meq/L (ref 3–11)
AST: 17 U/L (ref 5–34)
Alkaline Phosphatase: 61 U/L (ref 40–150)
BUN: 11.7 mg/dL (ref 7.0–26.0)
CALCIUM: 9.3 mg/dL (ref 8.4–10.4)
CHLORIDE: 104 meq/L (ref 98–109)
CO2: 30 meq/L — AB (ref 22–29)
CREATININE: 0.8 mg/dL (ref 0.6–1.1)
EGFR: 83 mL/min/{1.73_m2} — ABNORMAL LOW (ref >90)
Glucose: 77 mg/dl (ref 70–140)
POTASSIUM: 4.4 meq/L (ref 3.5–5.1)
Sodium: 142 mEq/L (ref 136–145)
TOTAL PROTEIN: 6.5 g/dL (ref 6.4–8.3)
Total Bilirubin: 0.64 mg/dL (ref 0.20–1.20)

## 2014-01-26 NOTE — Telephone Encounter (Signed)
Pt confirmed labs/ov per 12/29 POF, gave pt AVS.... KJ °

## 2014-01-26 NOTE — Progress Notes (Signed)
Hematology and Oncology Follow Up Visit  Sherry Wells [id number] 09/17/1950 63 y.o. 01/26/2014 8:25 AM Sherry Wells, MDHopper, Darrick Penna, MD   Principle Diagnosis: 63 year old woman with [name]. Her tumor is his high-grade in situ with comedo necrosis. Her tumor is ER/PR negative   Prior Therapy: On 07/13/2013 she underwent a left partial mastectomy and a left axillary sentinel lymph node sampling by Dr. Dalbert Batman. She is status post adjuvant radiation therapy completed on 10/07/2013. She received 40 grays in 15 fractions to the left breast with 10 gray in 5 fractions left breast boost.   Current therapy: Observation and surveillance. She declined tamoxifen therapy.  Interim History:  Sherry Wells presents today for a follow-up visit. Since the last visit, she has been doing very well. She completed radiation therapy in September without any complications. She still have some skin irritation and some occasional discomfort in her left axilla. But otherwise had not reported any lumps or masses or lesions.  She does not report any headaches blurred vision double vision. Did not report any motor sensory neuropathy. Supporting alteration of mental status or depression.fevers or chills or sweats. Does not report any cough or hemoptysis or hematemesis. Does not report any nausea or vomiting or abdominal pain. Does not report any leg edema palpitations and orthopnea. Does not report any frequency urgency or hesitancy. Does not report any skeletal complaints. Does not report any rashes or lesions or lymphadenopathy. Rest of her review of systems unremarkab  Medications: I have reviewed the patient's current medications.  Current Outpatient Prescriptions  Medication Sig Dispense Refill  . Calcium Carbonate-Vitamin D (CALTRATE 600+D) 600-400 MG-UNIT per tablet Take 1 tablet by mouth 2 (two) times daily.    . cholecalciferol (VITAMIN D) 400 UNITS TABS Take  400 Units by mouth daily.    Marland Kitchen doxycycline (ORACEA) 40 MG capsule Take 40 mg by mouth every morning. As needed for rosacea    . Multiple Vitamins-Minerals (MULTIVITAMIN WITH MINERALS) tablet Take 1 tablet by mouth daily.    Marland Kitchen oxybutynin (DITROPAN-XL) 10 MG 24 hr tablet Take 10 mg by mouth daily.    . vitamin C (ASCORBIC ACID) 500 MG tablet Take 500 mg by mouth daily.     No current facility-administered medications for this visit.     Allergies: No Known Allergies  Past Medical History, Surgical history, Social history, and Family History were reviewed and updated.   Physical Exam: Blood pressure 124/70, pulse 57, temperature 98.4 F (36.9 C), temperature source Oral, resp. rate 18, height 5\' 5"  (1.651 m), weight 132 lb 12.8 oz (60.238 kg), SpO2 100 %. ECOG: 0 General appearance: alert and cooperative Head: Normocephalic, without obvious abnormality Neck: no adenopathy Lymph nodes: Cervical, supraclavicular, and axillary nodes normal. Heart:regular rate and rhythm, S1, S2 normal, no murmur, click, rub or gallop Lung:chest clear, no wheezing, rales, normal symmetric air entry Abdomin: soft, non-tender, without masses or organomegaly EXT:no erythema, induration, or nodules   Lab Results: Lab Results  Component Value Date   WBC 5.1 01/26/2014   HGB 13.6 01/26/2014   HCT 41.4 01/26/2014   MCV 102.2* 01/26/2014   PLT 177 01/26/2014     Chemistry      Component Value Date/Time   NA 137 10/30/2013 1511   K 4.1 10/30/2013 1511   CL 101 10/30/2013 1511   CO2 31 10/30/2013 1511   BUN 12 10/30/2013 1511   CREATININE 0.6 10/30/2013 1511  Component Value Date/Time   CALCIUM 9.8 10/30/2013 1511   ALKPHOS 61 10/30/2013 1511   AST 22 10/30/2013 1511   ALT 19 10/30/2013 1511   BILITOT 0.9 10/30/2013 1511          Impression and Plan:   63 year old woman with the following issues  1. 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 Her tumor is his high-grade in  situ with comedo necrosis. Her tumor is ER/PR negative and she is status post lumpectomy and negative lymph node sampling. She completed adjuvant radiation therapy without any complications. She declined tamoxifen therapy in the past. She continues to be on observation surveillance without any evidence to suggest recurrent disease. She is scheduled for follow-up with Dr. Dalbert Batman in January and she'll have a mammogram in April 2016.  2. Follow-up: Will be in 6 months sooner if needed to.  [facility], MD 12/29/20158:25 AM

## 2014-06-03 ENCOUNTER — Other Ambulatory Visit: Payer: Self-pay | Admitting: Radiation Oncology

## 2014-06-03 ENCOUNTER — Encounter: Payer: Self-pay | Admitting: Internal Medicine

## 2014-06-03 ENCOUNTER — Ambulatory Visit
Admission: RE | Admit: 2014-06-03 | Discharge: 2014-06-03 | Disposition: A | Discharge status: Home / Self Care | Payer: BLUE CROSS/BLUE SHIELD | Source: Ambulatory Visit | Attending: Radiation Oncology | Admitting: Radiation Oncology

## 2014-06-03 DIAGNOSIS — C50412 Malignant neoplasm of upper-outer quadrant of left female breast: Secondary | ICD-10-CM

## 2014-06-03 DIAGNOSIS — R928 Other abnormal and inconclusive findings on diagnostic imaging of breast: Secondary | ICD-10-CM

## 2014-07-28 ENCOUNTER — Ambulatory Visit (HOSPITAL_BASED_OUTPATIENT_CLINIC_OR_DEPARTMENT_OTHER): Payer: BLUE CROSS/BLUE SHIELD | Admitting: Oncology

## 2014-07-28 ENCOUNTER — Other Ambulatory Visit (HOSPITAL_BASED_OUTPATIENT_CLINIC_OR_DEPARTMENT_OTHER): Payer: BLUE CROSS/BLUE SHIELD

## 2014-07-28 VITALS — BP 126/66 | HR 54 | Temp 98.7°F | Resp 18 | Ht 65.0 in | Wt 136.7 lb

## 2014-07-28 DIAGNOSIS — Z853 Personal history of malignant neoplasm of breast: Secondary | ICD-10-CM

## 2014-07-28 DIAGNOSIS — D0590 Unspecified type of carcinoma in situ of unspecified breast: Secondary | ICD-10-CM

## 2014-07-28 LAB — CBC WITH DIFFERENTIAL/PLATELET
BASO%: 0.5 % (ref 0.0–2.0)
BASOS ABS: 0 10*3/uL (ref 0.0–0.1)
EOS ABS: 0.1 10*3/uL (ref 0.0–0.5)
EOS%: 1.3 % (ref 0.0–7.0)
HEMATOCRIT: 38.3 % (ref 34.8–46.6)
HEMOGLOBIN: 12.9 g/dL (ref 11.6–15.9)
LYMPH%: 26 % (ref 14.0–49.7)
MCH: 33.9 pg (ref 25.1–34.0)
MCHC: 33.7 g/dL (ref 31.5–36.0)
MCV: 100.5 fL (ref 79.5–101.0)
MONO#: 0.4 10*3/uL (ref 0.1–0.9)
MONO%: 8.2 % (ref 0.0–14.0)
NEUT#: 2.8 10*3/uL (ref 1.5–6.5)
NEUT%: 64 % (ref 38.4–76.8)
Platelets: 181 10*3/uL (ref 145–400)
RBC: 3.81 10*6/uL (ref 3.70–5.45)
RDW: 13.4 % (ref 11.2–14.5)
WBC: 4.4 10*3/uL (ref 3.9–10.3)
lymph#: 1.1 10*3/uL (ref 0.9–3.3)

## 2014-07-28 LAB — COMPREHENSIVE METABOLIC PANEL (CC13)
ALBUMIN: 3.9 g/dL (ref 3.5–5.0)
ALT: 15 U/L (ref 0–55)
AST: 19 U/L (ref 5–34)
Alkaline Phosphatase: 56 U/L (ref 40–150)
Anion Gap: 7 mEq/L (ref 3–11)
BUN: 13.9 mg/dL (ref 7.0–26.0)
CO2: 29 mEq/L (ref 22–29)
Calcium: 9.1 mg/dL (ref 8.4–10.4)
Chloride: 105 mEq/L (ref 98–109)
Creatinine: 0.8 mg/dL (ref 0.6–1.1)
EGFR: 79 mL/min/{1.73_m2} — AB (ref >90)
GLUCOSE: 70 mg/dL (ref 70–140)
Potassium: 4.2 mEq/L (ref 3.5–5.1)
Sodium: 142 mEq/L (ref 136–145)
TOTAL PROTEIN: 6 g/dL — AB (ref 6.4–8.3)
Total Bilirubin: 0.44 mg/dL (ref 0.20–1.20)

## 2014-07-28 NOTE — Progress Notes (Signed)
Hematology and Oncology Follow Up Visit  Sherry Wells [id number] [birth year] 64 y.o. 07/28/2014 8:24 AM Sherry Wells, MDHopper, Darrick Penna, MD   Principle Diagnosis: 64 year old woman with [name]. Her tumor is his high-grade in situ with comedo necrosis. Her tumor is ER/PR negative   Prior Therapy: On 07/13/2013 she underwent a left partial mastectomy and a left axillary sentinel lymph node sampling by Dr. Dalbert Batman. She is status post adjuvant radiation therapy completed on 10/07/2013. She received 40 grays in 15 fractions to the left breast with 10 gray in 5 fractions left breast boost.   Current therapy: Observation and surveillance. She declined tamoxifen therapy.  Interim History:  Sherry Wells presents today for a follow-up visit by her self. Since the last visit, she reports no complaints. Continues to be healthy without any hospitalization or illnesses. She has not reported any lumps or masses in her breast. Has not reported any constitutional symptoms. Continues to be very active and up to date on her mammography.  She does not report any headaches blurred vision double vision. Did not report any motor sensory neuropathy. Supporting alteration of mental status or depression.fevers or chills or sweats. Does not report any cough or hemoptysis or hematemesis. Does not report any nausea or vomiting or abdominal pain. Does not report any leg edema palpitations and orthopnea. Does not report any frequency urgency or hesitancy. Does not report any skeletal complaints. Does not report any rashes or lesions or lymphadenopathy. Rest of her review of systems unremarkab  Medications: I have reviewed the patient's current medications.  Current Outpatient Prescriptions  Medication Sig Dispense Refill  . Calcium Carbonate-Vitamin D (CALTRATE 600+D) 600-400 MG-UNIT per tablet Take 1 tablet by mouth 2 (two) times daily.    . cholecalciferol (VITAMIN D) 400  UNITS TABS Take 400 Units by mouth daily.    Marland Kitchen doxycycline (ORACEA) 40 MG capsule Take 40 mg by mouth every morning. As needed for rosacea    . Multiple Vitamins-Minerals (MULTIVITAMIN WITH MINERALS) tablet Take 1 tablet by mouth daily.    Marland Kitchen oxybutynin (DITROPAN-XL) 10 MG 24 hr tablet Take 10 mg by mouth daily.    . vitamin C (ASCORBIC ACID) 500 MG tablet Take 500 mg by mouth daily.     No current facility-administered medications for this visit.     Allergies: No Known Allergies  Past Medical History, Surgical history, Social history, and Family History were reviewed and updated.   Physical Exam: Blood pressure 126/66, pulse 54, temperature 98.7 F (37.1 C), temperature source Oral, resp. rate 18, height 5\' 5"  (1.651 m), weight 136 lb 11.2 oz (62.007 kg), SpO2 100 %. ECOG: 0 General appearance: alert and cooperative. Not in any distress. Head: Normocephalic, without obvious abnormality Neck: no adenopathy Lymph nodes: Cervical, supraclavicular, and axillary nodes normal. Heart:regular rate and rhythm, S1, S2 normal, no murmur, click, rub or gallop Lung:chest clear, no wheezing, rales, normal symmetric air entry Abdomin: soft, non-tender, without masses or organomegaly EXT:no erythema, induration, or nodules   Lab Results: Lab Results  Component Value Date   WBC 4.4 07/28/2014   HGB 12.9 07/28/2014   HCT 38.3 07/28/2014   MCV 100.5 07/28/2014   PLT 181 07/28/2014     Chemistry      Component Value Date/Time   NA 142 01/26/2014 0800   NA 137 10/30/2013 1511   K 4.4 01/26/2014 0800   K 4.1 10/30/2013 1511   CL 101 10/30/2013  1511   CO2 30* 01/26/2014 0800   CO2 31 10/30/2013 1511   BUN 11.7 01/26/2014 0800   BUN 12 10/30/2013 1511   CREATININE 0.8 01/26/2014 0800   CREATININE 0.6 10/30/2013 1511      Component Value Date/Time   CALCIUM 9.3 01/26/2014 0800   CALCIUM 9.8 10/30/2013 1511   ALKPHOS 61 01/26/2014 0800   ALKPHOS 61 10/30/2013 1511   AST 17 01/26/2014  0800   AST 22 10/30/2013 1511   ALT 16 01/26/2014 0800   ALT 19 10/30/2013 1511   BILITOT 0.64 01/26/2014 0800   BILITOT 0.9 10/30/2013 1511          Impression and Plan:   64 year old woman with the following issues  1. 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 Her tumor is his high-grade in situ with comedo necrosis. Her tumor is ER/PR negative and she is status post lumpectomy and negative lymph node sampling. She completed adjuvant radiation therapy without any complications. She declined tamoxifen therapy in the past.   She is up-to-date on her mammography without any evidence of recurrence. She continues to follow with radiation oncology as well as surgery. She also gets routine examination by her OB/GYN. She has expressed interest not returning to medical oncology at this time which I think is reasonable. I told her as long as she is following up with these other specialties I see no reason why she needs to follow up routinely with medical oncology especially when she is not on any hormone therapy. I'll be happy to see her in the future however if issues arise.  2. Follow-up: Will be as needed.  [facility], MD 6/29/20168:24 AM

## 2014-11-05 ENCOUNTER — Ambulatory Visit
Admission: RE | Admit: 2014-11-05 | Discharge: 2014-11-05 | Disposition: A | Discharge status: Home / Self Care | Payer: BLUE CROSS/BLUE SHIELD | Source: Ambulatory Visit | Attending: Radiation Oncology | Admitting: Radiation Oncology

## 2014-11-05 VITALS — BP 130/70 | HR 69 | Temp 97.8°F | Ht 65.0 in | Wt 138.7 lb

## 2014-11-05 DIAGNOSIS — C50412 Malignant neoplasm of upper-outer quadrant of left female breast: Secondary | ICD-10-CM

## 2014-11-05 NOTE — Progress Notes (Signed)
Radiation Oncology         [phone] ________________________________  Name: MAYVIS AGUDELO MRN: [id number]  Date: 11/05/2014  DOB: 02-28-1950  Follow-Up Visit Note  Outpatient  CC: Unice Cobble, MD  Fanny Skates, MD  Diagnosis and Prior Radiotherapy:    ICD-9-CM ICD-10-CM   1. Malignant neoplasm of upper-outer quadrant of left female breast ([place]) 174.4 C50.412    Stage 0 TisN0M0 left breast DCIS, ER/PR negative   Indication for treatment: curative  Radiation treatment dates: 09/08/2013-10/07/2013  Site/dose: 1) Left breast / 40.05Gy in 15 fractions  2) Left Breast Boost / 10 Gy in 5 fractions  Narrative: Ms. Sherry Wells is here for follow-up of radiation to her left breast completed 10/07/2013. She states she has no pain. She does report some fatigue, but is still working full-time as a Network engineer at [employer]. She does report some tenderness in her left axilla and the lateral side of her breast.   She received bilateral mammograms in May (BI-RADS 2). Targeted US showed normal appearing dense fibroglandular tissue in the upper outer qudrant of the left breast.                              ALLERGIES:  has No Known Allergies.  Meds: Current Outpatient Prescriptions  Medication Sig Dispense Refill  . Calcium Carbonate-Vitamin D (CALTRATE 600+D) 600-400 MG-UNIT per tablet Take 1 tablet by mouth 2 (two) times daily.    . cholecalciferol (VITAMIN D) 400 UNITS TABS Take 400 Units by mouth daily.    Marland Kitchen doxycycline (ORACEA) 40 MG capsule Take 40 mg by mouth every morning. As needed for rosacea    . Multiple Vitamins-Minerals (MULTIVITAMIN WITH MINERALS) tablet Take 1 tablet by mouth daily.    Marland Kitchen oxybutynin (DITROPAN-XL) 10 MG 24 hr tablet Take 10 mg by mouth daily.    . vitamin C (ASCORBIC ACID) 500 MG tablet Take 500 mg by mouth daily.     No current facility-administered medications for this encounter.    Physical Findings: The patient is in no acute distress. Patient  is alert and oriented.  height is 5\' 5"  (1.651 m) and weight is 138 lb 11.2 oz (62.914 kg). Her temperature is 97.8 F (36.6 C). Her blood pressure is 130/70 and her pulse is 69. Marland Kitchen    No palpable masses in the cervical or supraclavicular neck Heart has a regular rate and rhythm Chest clear to auscultation bilaterally Palpable scar tissue under the lateral lumpectomy scar of the left breast. Not a clinical concern. No palpable masses of concern in the breast or axillary regions.   Lab Findings: Lab Results  Component Value Date   WBC 4.4 07/28/2014   HGB 12.9 07/28/2014   HCT 38.3 07/28/2014   MCV 100.5 07/28/2014   PLT 181 07/28/2014    Radiographic Findings: No results found.  Impression/Plan:  The patient is doing well with no evidence of disease. I recommended she enroll in the survivorship program. I will reach out to Chestine Spore, NP to arrange a visit in 6 months to start this program. I will see her back on a PRN basis.   _____________________________________   Eppie Gibson, MD  This document serves as a record of services personally performed by Eppie Gibson, MD. It was created on her behalf by Derek Mound, a trained medical scribe. The creation of this record is based on the scribe's personal observations and the provider's statements  to them. This document has been checked and approved by the attending provider.

## 2014-11-05 NOTE — Progress Notes (Signed)
Ms. Sherry Wells is here for follow-up of radiation to her left breast completed 10/07/2013. She states she has no pain. She does report some fatigue, but is still working full-time as a Network engineer at [employer]. She does report some tenderness in her left axilla and the lateral side of her breast.  BP 130/70 mmHg  Pulse 69  Temp(Src) 97.8 F (36.6 C)  Ht 5\' 5"  (1.651 m)  Wt 138 lb 11.2 oz (62.914 kg)  BMI 23.08 kg/m2  Wt Readings from Last 3 Encounters:  11/05/14 138 lb 11.2 oz (62.914 kg)  07/28/14 136 lb 11.2 oz (62.007 kg)  01/26/14 132 lb 12.8 oz (60.238 kg)

## 2014-11-08 ENCOUNTER — Other Ambulatory Visit: Payer: Self-pay | Admitting: Nurse Practitioner

## 2014-11-08 ENCOUNTER — Telehealth: Payer: Self-pay | Admitting: Oncology

## 2014-11-08 DIAGNOSIS — D0592 Unspecified type of carcinoma in situ of left breast: Secondary | ICD-10-CM

## 2014-11-08 NOTE — Telephone Encounter (Signed)
Spoke with patient and she is aware of her appointment °

## 2014-11-23 ENCOUNTER — Encounter: Payer: Self-pay | Admitting: Internal Medicine

## 2015-01-11 ENCOUNTER — Encounter: Payer: Self-pay | Admitting: Internal Medicine

## 2015-03-02 ENCOUNTER — Encounter: Payer: Self-pay | Admitting: Adult Health

## 2015-03-02 NOTE — Progress Notes (Signed)
A birthday card was mailed to the patient today on behalf of the Survivorship Program at [facility].   Asar Evilsizer, NP Survivorship Program [facility] [phone]  

## 2015-05-16 ENCOUNTER — Encounter: Payer: Self-pay | Admitting: Nurse Practitioner

## 2015-05-16 ENCOUNTER — Telehealth: Payer: Self-pay | Admitting: Nurse Practitioner

## 2015-05-16 ENCOUNTER — Other Ambulatory Visit: Payer: Self-pay | Admitting: Obstetrics and Gynecology

## 2015-05-16 ENCOUNTER — Ambulatory Visit (HOSPITAL_BASED_OUTPATIENT_CLINIC_OR_DEPARTMENT_OTHER): Payer: BLUE CROSS/BLUE SHIELD | Admitting: Nurse Practitioner

## 2015-05-16 VITALS — BP 126/76 | HR 70 | Temp 98.0°F | Resp 20 | Ht 65.0 in | Wt 138.8 lb

## 2015-05-16 DIAGNOSIS — Z853 Personal history of malignant neoplasm of breast: Secondary | ICD-10-CM

## 2015-05-16 DIAGNOSIS — Z923 Personal history of irradiation: Secondary | ICD-10-CM

## 2015-05-16 DIAGNOSIS — Z171 Estrogen receptor negative status [ER-]: Secondary | ICD-10-CM

## 2015-05-16 DIAGNOSIS — D0512 Intraductal carcinoma in situ of left breast: Secondary | ICD-10-CM | POA: Diagnosis not present

## 2015-05-16 DIAGNOSIS — C50412 Malignant neoplasm of upper-outer quadrant of left female breast: Secondary | ICD-10-CM | POA: Insufficient documentation

## 2015-05-16 DIAGNOSIS — D0590 Unspecified type of carcinoma in situ of unspecified breast: Secondary | ICD-10-CM

## 2015-05-16 NOTE — Telephone Encounter (Signed)
Gave patient avs report and appointments for October  °

## 2015-05-16 NOTE — Progress Notes (Signed)
CLINIC:  Cancer Survivorship   REASON FOR VISIT:  Routine follow-up post-treatment for history of breast cancer.  BRIEF ONCOLOGIC HISTORY:    Breast cancer of upper-outer quadrant of left female breast ([place])   06/11/2013 Initial Biopsy Left breast core needle bx: DCIS, high grade, with comedonecrosis, ER- (0%), PR- (0%)   07/13/2013 Definitive Surgery Left lumpectomy/SLNB: DCIS, high grade, comedonecrosis. 0/3 LN   07/13/2013 Pathologic Stage Stage 0: Tis N0   09/08/2013 - 10/07/2013 Radiation Therapy Adjuvant RT: Left breast: 40.05 Gy over 15 fractions; left breast bost: 10 Gy over 5 fractions; total dose 50.05 Gy    INTERVAL HISTORY:  Sherry Wells presents to the [facility] today for ongoing follow up regarding her history of breast cancer. Overall, Sherry Wells reports feeling well since her last visit with Drs. Alen Blew and Isidore Moos in June and October 2016, respectively. She has not noticed any change within her breast and her mammogram is scheduled for 06/06/2015.  She denies any headache, cough, shortness of breath, or bone pain other than a beginning bone spur in her right heel. She reports a good appetite and denies any weight loss.  In fact she has gained weight.  REVIEW OF SYSTEMS:  General: Denies fever, chills, unintentional weight loss, or generalized fatigue.  HEENT: Denies visual changes, hearing loss, mouth sores, or difficulty swallowing. Cardiac: Denies palpitations and lower extremity edema.  Respiratory: Denies wheeze or dyspnea on exertion.  Breast: As above. GI: Denies abdominal pain, constipation, diarrhea, nausea, or vomiting.  GU: Denies dysuria, hematuria, vaginal bleeding, vaginal discharge, or vaginal dryness.  Musculoskeletal: As above. Neuro: Denies recent fall or numbness / tingling in her extremities.  Skin: Denies rash, pruritis, or open wounds.  Psych: Denies depression, anxiety, insomnia, or memory loss.   A 14-point review of systems was completed  and was negative, except as noted above.   ONCOLOGY TREATMENT TEAM:  1. Surgeon:  Dr. Dalbert Batman at [facility]  2. Medical Oncologist: Dr. Alen Blew 3. Radiation Oncologist: Dr. Isidore Moos    PAST MEDICAL/SURGICAL HISTORY:  Past Medical History  Diagnosis Date  . Fibrocystic breast disease   . Osteoporosis   . Hyperlipidemia   . PONV (postoperative nausea and vomiting)   . Breast cancer ([place]) 06/2013    left UOQ  . S/P radiation therapy 09/08/2013-10/07/2013     1) Left breast / 40.05Gy in 15 fractions/ 2) Left Breast Boost / 10 Gy in 5 fractions   Past Surgical History  Procedure Laterality Date  . Tubal ligation  1994  . Colonoscopy  2002 & 2013    negative; [facility]  . Dilation and curettage of uterus      x2  . Breast lumpectomy  1980    cyst  . Breast cyst aspiration Left 1980  . Breast lumpectomy Left 07/13/2013     ALLERGIES:  No Known Allergies   CURRENT MEDICATIONS:  Current Outpatient Prescriptions on File Prior to Visit  Medication Sig Dispense Refill  . Calcium Carbonate-Vitamin D (CALTRATE 600+D) 600-400 MG-UNIT per tablet Take 1 tablet by mouth 2 (two) times daily.    . cholecalciferol (VITAMIN D) 400 UNITS TABS Take 400 Units by mouth daily.    Marland Kitchen doxycycline (ORACEA) 40 MG capsule Take 40 mg by mouth every morning. As needed for rosacea    . Multiple Vitamins-Minerals (MULTIVITAMIN WITH MINERALS) tablet Take 1 tablet by mouth daily.    Marland Kitchen oxybutynin (DITROPAN-XL) 10 MG 24 hr tablet Take 10 mg by mouth  daily.    . vitamin C (ASCORBIC ACID) 500 MG tablet Take 500 mg by mouth daily.     No current facility-administered medications on file prior to visit.     ONCOLOGIC FAMILY HISTORY:  Family History  Problem Relation Age of Onset  . Diabetes Mother   . Transient ischemic attack Mother     in 41s  . Hypertension Mother   . Valvular heart disease Father     Aortic valve  . Throat cancer Maternal Uncle   . Cancer Maternal Uncle     ? primary    . Osteoporosis Sister   . Valvular heart disease Paternal Uncle     Aortic valve  . Cardiomyopathy Sister      GENETIC COUNSELING/TESTING: No   SOCIAL HISTORY:  Sherry Wells is married and lives with her spouse in [place], [place].   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  She denies any current or history of tobacco, alcohol, or illicit drug use.    HEALTH MAINTENANCE: Colonoscopy: 2013 PAP: 2015 Bone density: 2015  PHYSICAL EXAMINATION:  Vital Signs: Filed Vitals:   05/16/15 1624  BP: 126/76  Pulse: 70  Temp: 98 F (36.7 C)  Resp: 20   Weight: 138.8 (stable) ECOG performance status: 0 General: Well-nourished, well-appearing female in no acute distress.  She is  unaccompanied in clinic today.   HEENT: Head is atraumatic and normocephalic.  Pupils equal and reactive to light and accomodation. Conjunctivae clear without exudate.  Sclerae anicteric. Oral mucosa is pink, moist, and intact without lesions.  Oropharynx is pink without lesions or erythema.  Lymph: No cervical, supraclavicular, infraclavicular, or axillary lymphadenopathy noted on palpation.  Cardiovascular: Regular rate and rhythm without murmurs, rubs, or gallops. Respiratory: Clear to auscultation bilaterally. Chest expansion symmetric without accessory muscle use on inspiration or expiration.  Breast: Bilateral breast exam performed.  Slight tenderness to palpation along left breast at lumpectomy scar.  No mass or lesion in either breast. GI: Abdomen soft and round. No tenderness to palpation. Bowel sounds normoactive in 4 quadrants. No hepatosplenomegaly.   GU: Deferred.  Musculoskeletal: Muscle strength 5/5 in all extremities.   Neuro: No focal deficits. Steady gait.  Psych: Mood and affect normal and appropriate for situation.  Extremities: No edema, cyanosis, or clubbing.  Skin: Warm and dry. No open lesions noted.   LABORATORY DATA:  No results  found for this or any previous visit (from the past 2160 hour(s)).  DIAGNOSTIC IMAGING: Due May 2017     ASSESSMENT AND PLAN:   1. 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  Ms. Pigg is doing well with no clinical symptoms worrisome for cancer recurrence at this time. Barring any abnormalities on her upcoming mammography in May 2017, she will continue in a program of surveillance.  I have reviewed the recommendations for ongoing surveillance with her and she will follow-up with [place] in the Survivorship clinic in six months time with history and physical exam per surveillance protocol.  We will alternate her visits after that point with her gynecologist so that she is seen in six month intervals through 05/2018, at which time she will be five years out from diagnosis.  She was instructed to make [place] aware if she notes any change within her breast, any new symptoms  such as pain, shortness of breath, weight loss, or fatigue.    2. Cancer screening:  Due to Ms. Eichler's history and her age, she should receive screening for skin cancers, colon cancer, and gynecologic cancers.  The information and recommendations were shared with the patient and in her written after visit summary.  3. Health maintenance and wellness promotion:Ms. Archie Balboa and I discussed recommendations to maximize nutrition and minimize recurrence, such as increased intake of fruits, vegetables, lean proteins, and minimizing the intake of red meats and processed foods.  She was also encouraged to engage in moderate to vigorous exercise for 30 minutes per day most days of the week. We discussed the [employer] fitness program, which is designed for cancer survivors to help them become more physically fit after cancer treatments.  She was instructed to limit her alcohol  consumption and continue to abstain from tobacco use.    A total of 25 minutes of face-to-face time was spent with this patient with greater than 50% of that time in counseling and care-coordination.   Sylvan Cheese, NP  Survivorship Program [place] [phone]   Note: PRIMARY CARE PROVIDER Unice Cobble, MD [phone]

## 2015-05-16 NOTE — Patient Instructions (Signed)
Thank you for coming in today!  As we discussed, please continue to perform your self breast exam and report any changes. If you note any new symptoms (please see below), be sure to notify us ASAP.  Your mammogram will be due in May 2017.  Barring any abnormal findings, we'll have you return in six month's time for your next appointment or sooner if you have any problems. Please be sure to stop by scheduling on your way out to make those appointment(s).  Looking forward to working with you in the future!  Let us know if you have any questions!  Symptoms to Watch for and Report to Your Provider  . Return of the cancer symptoms you had before- such as a lump or new growth where your cancer first started . New or unusual pain that seems unrelated to an injury and does not go away, including back pain or bone pain . Weight loss without trying/intending . Unexplained bleeding . A rash or allergic reaction, such as swelling, severe itching or wheezing . Chills or fevers . Persistent headaches . Shortness of breath or difficulty breathing . Bloody stools or blood in your urine . Lumps, bumps, swelling and/or nipple discharge . Nausea, vomiting, diarrhea, loss of appetite, or trouble swallowing . A cough that doesn't go away . Abdominal pain . Swelling in your arms or legs . Fractures . Hot flashes or other menopausal symptoms . Any other signs mentioned by your doctor or nurse or any unusual symptoms                 that you just can't explain   NOTE: Just because you have certain symptoms, it doesn't mean the cancer has come back or you have a new cancer. Symptoms can be due to other problems that need to be addressed.  It is important to watch for these symptoms and report them to your provider so you can be medically evaluated for any of these concerns!    Living a Life of Wellness After Cancer:  *Note: Please consult your health care provider before using any medications, supplements,  over-the-counter products, or other interventions.  Also, please consult your primary care provider before you begin any lifestyle program (diet, exercise, etc.).  Your safety is our top priority and we want to make sure you continue to live a long and healthy life!    Healthy Lifestyle Recommendations  As a cancer survivor, it is important develop a lifelong commitment to a healthy lifestyle. A healthy lifestyle can prevent cancer from returning as well as prevent other diseases like heart disease, diabetes and high blood pressure.  These are some things that you can do to have a healthy lifestyle:  Marland Kitchen Maintain a healthy weight.  . Exercise daily per your doctor's orders. . Eat a balanced diet high in fruits, vegetables, bran, and fiber. Limit intake of red meat          and processed foods.  . Limit how much alcohol you consume, if at all. Ali Lowe regular bone mineral density testing for osteoporosis.  . Talk to your doctor about cardiovascular disease or "heart disease" screening. . Stop smoking (if you smoke). . Know your family history. . Be mindful of your emotional, social, and spiritual needs. . Meet regularly with a Primary Care Provider (PCP). Find a PCP if you do not             already have one. . Talk to your doctor about  regular cancer screening including screening for colon           cancer, GYN cancers, and skin cancer.

## 2015-06-06 ENCOUNTER — Ambulatory Visit
Admission: RE | Admit: 2015-06-06 | Discharge: 2015-06-06 | Disposition: A | Discharge status: Home / Self Care | Payer: BLUE CROSS/BLUE SHIELD | Source: Ambulatory Visit | Attending: Obstetrics and Gynecology | Admitting: Obstetrics and Gynecology

## 2015-06-06 DIAGNOSIS — Z853 Personal history of malignant neoplasm of breast: Secondary | ICD-10-CM

## 2015-06-18 IMAGING — MG MM DIAGNOSTIC UNILATERAL L
3 series · 3 of 3 positions shown · non-contrast
Comparison: Previous exams.

CLINICAL DATA: 63-year-old female with left breast high-grade DCIS
with necrosis post left partial mastectomy and sentinel node biopsy
on 07/13/2013 with negative margins and negative lymph nodes. Pre
radiation baseline mammogram of the left breast.

EXAM:
DIGITAL DIAGNOSTIC  LEFT MAMMOGRAM WITH CAD

[L CC]
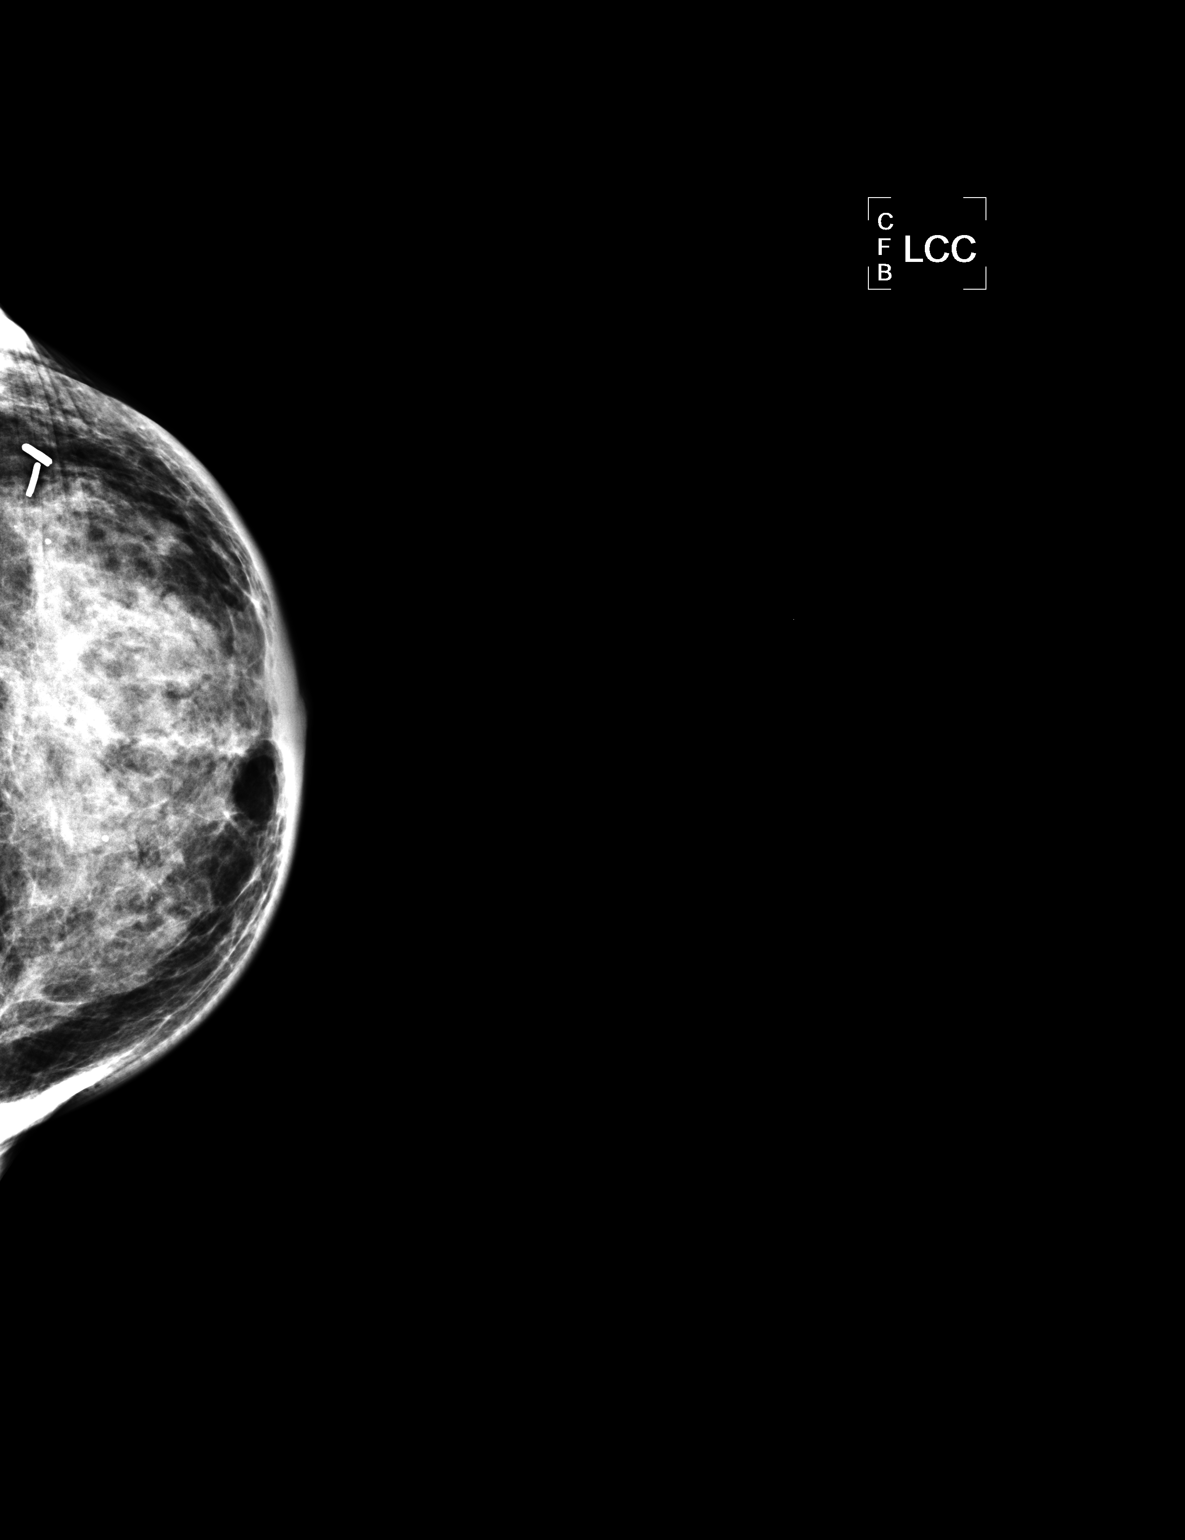

[L MLO]
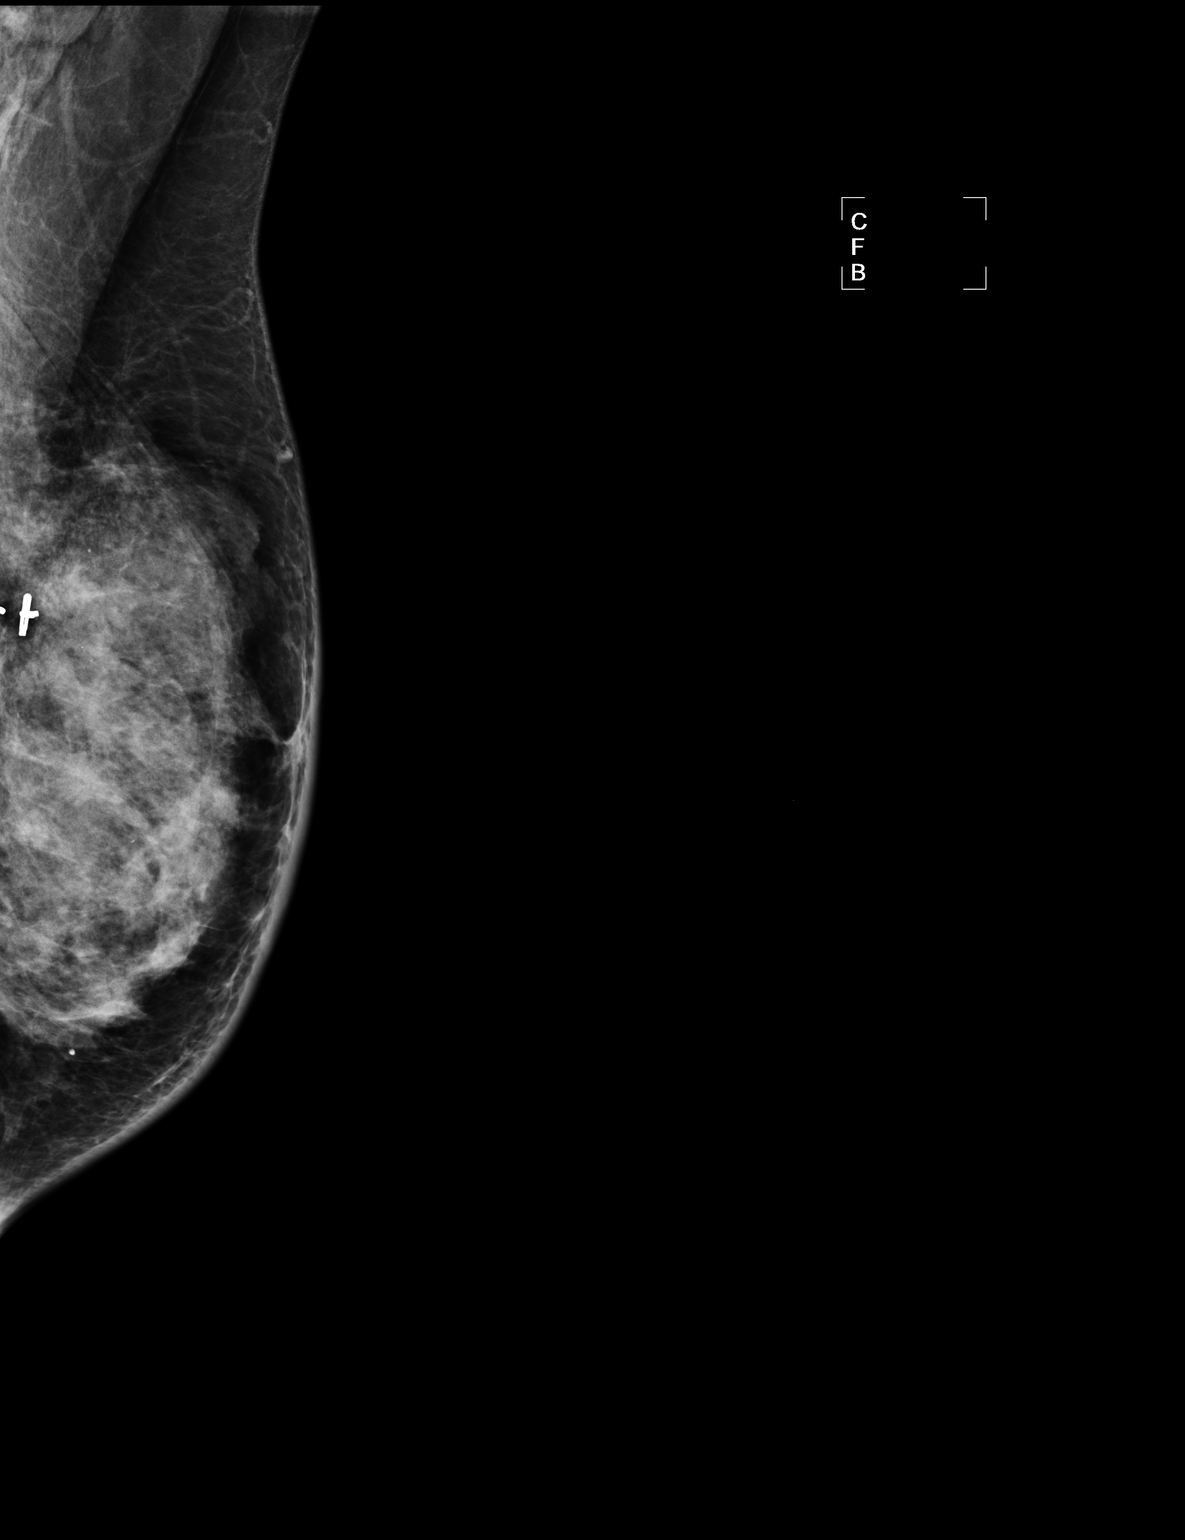

[L TAN]
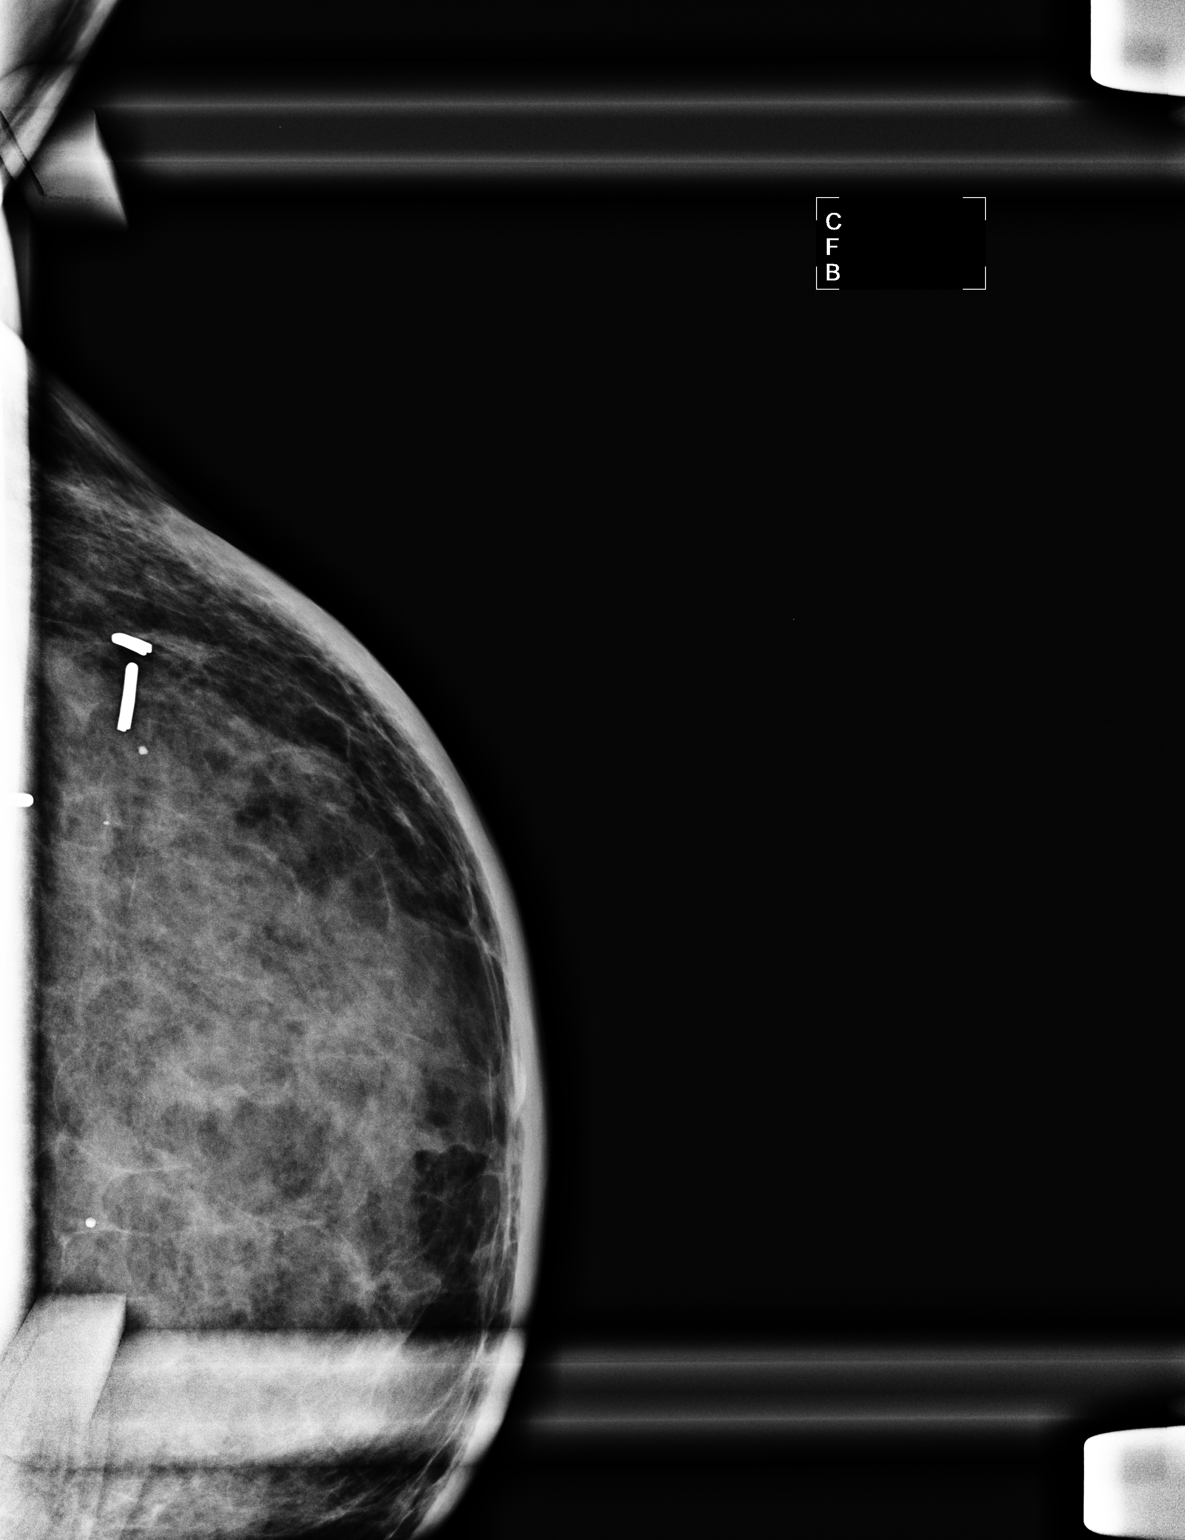

[3 of 3 positions shown; findings below may reference images not displayed]

ACR Breast Density Category d: The breast tissue is extremely dense,
which lowers the sensitivity of mammography.
FINDINGS: No suspicious masses or calcifications are seen in the left breast.
Interval postsurgical changes are seen in the upper outer posterior
left breast from interval partial mastectomy. A spot compression
magnification tangential view of the lumpectomy site in the left
breast was performed. There is no mammographic evidence of
malignancy at the lumpectomy site. Two coarse benign appearing
calcifications are seen adjacent to the lumpectomy site.

Mammographic images were processed with CAD.
IMPRESSION: Interval left breast lumpectomy. No mammographic evidence of
malignancy in the left breast.

RECOMMENDATION:
Recommend bilateral diagnostic mammography, for which the patient
will be due April 2013.

I have discussed the findings and recommendations with the patient.
Results were also provided in writing at the conclusion of the
visit. If applicable, a reminder letter will be sent to the patient
regarding the next appointment.

BI-RADS CATEGORY  2: Benign.

## 2015-10-11 ENCOUNTER — Telehealth: Payer: Self-pay | Admitting: Adult Health

## 2015-10-11 NOTE — Telephone Encounter (Signed)
11/10/2015 Appointment rescheduled to 11/21/2015 per patient request. Patient asked for late afternoon.

## 2015-11-10 ENCOUNTER — Encounter: Payer: BLUE CROSS/BLUE SHIELD | Admitting: Adult Health

## 2015-11-14 ENCOUNTER — Encounter: Payer: BLUE CROSS/BLUE SHIELD | Admitting: Nurse Practitioner

## 2015-11-21 ENCOUNTER — Telehealth: Payer: Self-pay | Admitting: Adult Health

## 2015-11-21 ENCOUNTER — Ambulatory Visit (HOSPITAL_BASED_OUTPATIENT_CLINIC_OR_DEPARTMENT_OTHER): Payer: BLUE CROSS/BLUE SHIELD | Admitting: Adult Health

## 2015-11-21 ENCOUNTER — Encounter: Payer: Self-pay | Admitting: Adult Health

## 2015-11-21 VITALS — BP 139/70 | HR 53 | Temp 98.2°F | Resp 18 | Ht 65.0 in | Wt 139.5 lb

## 2015-11-21 DIAGNOSIS — Z853 Personal history of malignant neoplasm of breast: Secondary | ICD-10-CM | POA: Diagnosis not present

## 2015-11-21 DIAGNOSIS — M79622 Pain in left upper arm: Secondary | ICD-10-CM | POA: Diagnosis not present

## 2015-11-21 DIAGNOSIS — M810 Age-related osteoporosis without current pathological fracture: Secondary | ICD-10-CM

## 2015-11-21 DIAGNOSIS — C50412 Malignant neoplasm of upper-outer quadrant of left female breast: Secondary | ICD-10-CM

## 2015-11-21 DIAGNOSIS — Z171 Estrogen receptor negative status [ER-]: Principal | ICD-10-CM

## 2015-11-21 NOTE — Progress Notes (Signed)
CLINIC:  Survivorship   REASON FOR VISIT:  Routine follow-up for history of breast cancer.   BRIEF ONCOLOGIC HISTORY:    Breast cancer of upper-outer quadrant of left female breast ([place])   06/11/2013 Initial Biopsy    Left breast core needle bx: DCIS, high grade, with comedonecrosis, ER- (0%), PR- (0%)      07/13/2013 Definitive Surgery    Left lumpectomy/SLNB: DCIS, high grade, comedonecrosis. 0/3 LN      07/13/2013 Pathologic Stage    Stage 0: Tis N0      09/08/2013 - 10/07/2013 Radiation Therapy    Adjuvant RT: Left breast: 40.05 Gy over 15 fractions; left breast bost: 10 Gy over 5 fractions; total dose 50.05 Gy       INTERVAL HISTORY:  Ms. Trenkamp presents to the [facility] today for routine follow-up for her history of breast cancer.  Overall, she reports feeling quite well. She has occasional left axilla soreness, which has been there since her lumpectomy and sentinel lymph node biopsy in 2015. Patient has not worsened and is not severe. She tells me she understands that it is a normal possible side effects of surgery.  Her appetite and energy levels are good. She really wants to try to exercise more, but cannot find the time to do it with her work schedule. She is taking the steps at work, rather than taking the elevator.  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    REVIEW OF SYSTEMS:  Review of Systems  Constitutional: Negative.   HENT: Negative.   Eyes: Negative.   Respiratory: Negative.   Cardiovascular: Negative.   Gastrointestinal: Negative.   Genitourinary: Negative.   Musculoskeletal: Positive for joint pain.       Occasional bilateral knee pain, likely arthritis; she plans to talk to her PCP about this.  Skin: Negative.   Neurological: Negative.   Endo/Heme/Allergies: Negative.   Psychiatric/Behavioral: Negative.   GU: Denies vaginal bleeding. Breast: Denies  any new nodularity, masses, nipple changes, or nipple discharge.    A 14-point review of systems was completed and was negative, except as noted above.    PAST MEDICAL/SURGICAL HISTORY:  Past Medical History:  Diagnosis Date  . Breast cancer ([place]) 06/2013   left UOQ  . Fibrocystic breast disease   . Hyperlipidemia   . Osteoporosis   . PONV (postoperative nausea and vomiting)   . S/P radiation therapy 09/08/2013-10/07/2013    1) Left breast / 40.05Gy in 15 fractions/ 2) Left Breast Boost / 10 Gy in 5 fractions   Past Surgical History:  Procedure Laterality Date  . BREAST CYST ASPIRATION Left 1980  . BREAST LUMPECTOMY  1980   cyst  . BREAST LUMPECTOMY Left 07/13/2013  . COLONOSCOPY  2002 & 2013   negative; [facility]  . DILATION AND CURETTAGE OF UTERUS     x2  . TUBAL LIGATION  1994     ALLERGIES:  No Known Allergies   CURRENT MEDICATIONS:  Outpatient Encounter Prescriptions as of 11/21/2015  Medication Sig Note  . Calcium Carbonate-Vitamin D (CALTRATE 600+D) 600-400 MG-UNIT per tablet Take 1 tablet by mouth 2 (two) times daily.   . cholecalciferol (VITAMIN D) 400 UNITS TABS Take 400 Units by mouth daily.   Marland Kitchen doxycycline (ORACEA) 40 MG capsule Take 40 mg by mouth every morning. As needed for rosacea 10/30/2013: Dr Dayton Martes, Derm Taken prn  . Multiple  Vitamins-Minerals (MULTIVITAMIN WITH MINERALS) tablet Take 1 tablet by mouth daily.   Marland Kitchen oxybutynin (DITROPAN-XL) 10 MG 24 hr tablet Take 10 mg by mouth daily. 10/17/2011: Dr Sherren Mocha Meisinger, Gyn  . vitamin C (ASCORBIC ACID) 500 MG tablet Take 500 mg by mouth daily.    No facility-administered encounter medications on file as of 11/21/2015.      ONCOLOGIC FAMILY HISTORY:  Family History  Problem Relation Age of Onset  . Diabetes Mother   . Transient ischemic attack Mother     in 65s  . Hypertension Mother   . Valvular heart disease Father     Aortic valve  . Throat cancer Maternal Uncle   . Cancer Maternal Uncle     ?  primary  . Osteoporosis Sister   . Valvular heart disease Paternal Uncle     Aortic valve  . Cardiomyopathy Sister     GENETIC COUNSELING/TESTING: None.  SOCIAL HISTORY:  CARDEN KRIZMAN is married and lives with her husband in [place], [place]. She has 1 stepdaughter and one-step grandson. 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 She plans to retire in 03/2016. She denies any current tobacco, alcohol, or illicit drug use.   PHYSICAL EXAMINATION:  Vital Signs: Vitals:   11/21/15 1600  BP: 139/70  Pulse: (!) 53  Resp: 18  Temp: 98.2 F (36.8 C)   Filed Weights   11/21/15 1600  Weight: 139 lb 8 oz (63.3 kg)   General: Well-nourished, well-appearing female in no acute distress.  She is unaccompanied today.   HEENT: Head is normocephalic.  Pupils equal and reactive to light. Conjunctivae clear without exudate.  Sclerae anicteric. Oral mucosa is pink, moist.  Oropharynx is pink without lesions or erythema.  Lymph: No cervical, supraclavicular, or infraclavicular lymphadenopathy noted on palpation.  Cardiovascular: Regular rate and rhythm.Marland Kitchen Respiratory: Clear to auscultation bilaterally. Chest expansion symmetric; breathing non-labored.  Breast Exam:  -Left breast: No appreciable masses on palpation. No skin redness, thickening, or peau d'orange appearance; no nipple retraction or nipple discharge; healed scar without erythema or nodularity.  -Right breast: No appreciable masses on palpation. No skin redness, thickening, or peau d'orange appearance; no nipple retraction or nipple discharge. -Axilla: No axillary adenopathy bilaterally. Mild tenderness to palpation to left axillary region near previous sentinel lymph node biopsy scar; no palpable masses or scar nodularity.  GI: Abdomen soft and round; non-tender, non-distended. Bowel sounds normoactive. No hepatosplenomegaly.   GU: Deferred.  Neuro: No focal deficits. Steady gait.  Psych: Mood and affect normal  and appropriate for situation.  Extremities: No edema. Skin: Warm and dry.  LABORATORY DATA:  None for this visit.    DIAGNOSTIC IMAGING:  Most recent mammogram: 06/06/15     ASSESSMENT AND PLAN:  Ms.. Coyt is a pleasant 65 y.o. female with history of Stage 0 left breastDCIS, ER-/PR-, diagnosed in 05/2013; treated with lumpectomy & adjuvant radiation therapy.  She presents to the Survivorship Clinic for surveillance and routine follow-up.   1. History of Stage 0 left breast cancer:  Ms. Gauer is currently clinically and radiographically without evidence of disease or recurrence of breast cancer.  Her annual diagnostic mammogram will be due in 05/2016; orders placed today. She is not taking any anti-estrogen therapy, given that her breast cancer was estrogen receptor negative. She will return to the cancer center in 1 year for continued surveillance in the Survivorship Clinic. I encouraged her to call me with any questions or concerns that may come  up before her next visit here, and I would be happy to see her sooner if needed. She voiced understanding and agreed with this plan.  2. Left axilla discomfort: This is a chronic finding and likely secondary to her sentinel lymph node biopsy. There are no suspicious masses on physical exam. We discussed that often times women will experience chronic mastalgia or axillary discomfort after having lumpectomy and sentinel lymph node biopsy surgeries. She tells me the pain does not interfere with her daily activities, nor is she terribly worried about the discomfort; she just notices it from time to time with different arm movements. I provided reassurance that this is a normal finding and does not warrant additional evaluation at this time. We will certainly continue to monitor, and I encouraged her to call me if her symptoms worsen.  3. Bone health:  Given Ms. Biehl's age & history of breast cancer, she is at risk for bone demineralization. Her last  DEXA scan was on 01/04/14 and showed osteoporosis.  She tells me that she was on a "pill for my bones" in the past, but this was stopped by her gynecologist. She will be due for biennial DEXA scan this 12/2015. I'll offered to place the orders for her bone mineral density testing, but she prefers to discuss this with her new PCP, Dr. Billey Gosling, next week when she sees her. In the meantime, she was encouraged to continue her calcium & vitamin D supplementation, as well as increase her weight-bearing activities.  She was given education on specific food and activities to promote bone health.  4. Cancer screening:  Due to Ms. Houlton's history and her age, she should receive screening for skin cancers, colon cancer, and gynecologic cancers. Her last colonoscopy was in 2013; she was told to return in 10 years. Her last Pap smear was in 03/2013, and was told at that time that it would likely be her last Pap smear. She will get her annual flu vaccine next week at work. I also encouraged Ms. Maney to consider both the shingles and pneumonia vaccines as well. She plans to talk with her new PCP, Dr. Billey Gosling, whom she will be meeting with next week. Her former PCP, Dr. Linna Darner, recently retired. She was encouraged to follow-up with her PCP for appropriate cancer screenings.    5. Health maintenance and wellness promotion: Ms. Tessmann was encouraged to consume 5-7 servings of fruits and vegetables per day. She was also encouraged to engage in moderate to vigorous exercise for 30 minutes per day most days of the week. We discussed the [employer] program, which is a fitness program designed for cancer survivors through the [facility].  She was given a brochure today.  She was instructed to limit her alcohol consumption and continue to abstain from tobacco use.    Dispo:  -Annual mammogram due in 05/2016; orders placed today.  -Return to cancer center to see Survivorship NP in 10/2016.   A total of 20 minutes of  face-to-face time was spent with this patient with greater than 50% of that time in counseling and care-coordination.   Mike Craze, NP Survivorship Program [facility] [phone]   Note: PRIMARY CARE PROVIDER Binnie Rail, [place] [phone]

## 2015-11-21 NOTE — Telephone Encounter (Signed)
GAVE PATIENT AVS REPORT AND APPOINTMENTS FOR October 2018. PATIENT TO CONTACT [employer] FOR ANNUAL MAMMO.

## 2015-12-03 NOTE — Progress Notes (Signed)
Subjective:    Patient ID: Sherry Wells, female    DOB: Oct 23, 1950, 65 y.o.   MRN: [id number]  HPI She is here to establish with a new pcp.  She is here for a physical exam.   Her knees are hurting when she squats and it is harder to get up.  She denies pain with walking or going up stairs.  She is active with yard work and house work, but does not exercise regularly.     Medications and allergies reviewed with patient and updated if appropriate.  Patient Active Problem List   Diagnosis Date Noted  . Breast cancer of upper-outer quadrant of left female breast ([place]) 05/16/2015  . Breast cancer in situ 06/13/2013  . Elevated HDL 09/22/2008  . Osteoporosis 09/22/2008    Current Outpatient Prescriptions on File Prior to Visit  Medication Sig Dispense Refill  . Calcium Carbonate-Vitamin D (CALTRATE 600+D) 600-400 MG-UNIT per tablet Take 1 tablet by mouth 2 (two) times daily.    . cholecalciferol (VITAMIN D) 400 UNITS TABS Take 400 Units by mouth daily.    Marland Kitchen doxycycline (ORACEA) 40 MG capsule Take 40 mg by mouth every morning. As needed for rosacea    . Multiple Vitamins-Minerals (MULTIVITAMIN WITH MINERALS) tablet Take 1 tablet by mouth daily.    Marland Kitchen oxybutynin (DITROPAN-XL) 10 MG 24 hr tablet Take 10 mg by mouth daily.    . vitamin C (ASCORBIC ACID) 500 MG tablet Take 500 mg by mouth daily.     No current facility-administered medications on file prior to visit.     Past Medical History:  Diagnosis Date  . Breast cancer ([place]) 06/2013   left UOQ  . Fibrocystic breast disease   . Hyperlipidemia   . Osteoporosis   . PONV (postoperative nausea and vomiting)   . S/P radiation therapy 09/08/2013-10/07/2013    1) Left breast / 40.05Gy in 15 fractions/ 2) Left Breast Boost / 10 Gy in 5 fractions    Past Surgical History:  Procedure Laterality Date  . BREAST CYST ASPIRATION Left 1980  . BREAST LUMPECTOMY  1980   cyst  . BREAST LUMPECTOMY Left 07/13/2013  . COLONOSCOPY  2002 &  2013   negative; [facility]  . DILATION AND CURETTAGE OF UTERUS     x2  . TUBAL LIGATION  1994    Social History   Social History  . Marital status: Married    Spouse name: N/A  . Number of children: N/A  . Years of education: N/A   Social History Main Topics  . Smoking status: Never Smoker  . Smokeless tobacco: Never Used  . Alcohol use No  . Drug use: No  . Sexual activity: Not on file     Comment: meanarche 26, G86, menopause age 74, no HRT   Other Topics Concern  . Not on file   Social History Narrative  . No narrative on file    Family History  Problem Relation Age of Onset  . Diabetes Mother   . Transient ischemic attack Mother     in 72s  . Hypertension Mother   . Valvular heart disease Father     Aortic valve  . Throat cancer Maternal Uncle   . Cancer Maternal Uncle     ? primary  . Osteoporosis Sister   . Valvular heart disease Paternal Uncle     Aortic valve  . Cardiomyopathy Sister     Review of Systems  Constitutional: Negative for  appetite change, chills, fatigue, fever and unexpected weight change.  HENT: Negative for hearing loss and tinnitus.   Eyes: Negative for visual disturbance.  Respiratory: Negative for cough, shortness of breath and wheezing.   Cardiovascular: Negative for chest pain, palpitations and leg swelling.  Gastrointestinal: Negative for abdominal pain, blood in stool, constipation, diarrhea and nausea.       No gerd  Genitourinary: Negative for dysuria and hematuria.  Musculoskeletal: Positive for arthralgias (left thumb, knees - hard to get up after squatting). Negative for back pain and myalgias.  Skin: Negative for color change and rash.  Neurological: Negative for dizziness, light-headedness and headaches.  Psychiatric/Behavioral: Negative for dysphoric mood. The patient is not nervous/anxious.        Objective:   Vitals:   12/05/15 0957  BP: 126/82  Pulse: (!) 51  Resp: 16  Temp: 98.1 F (36.7 C)   Filed  Weights   12/05/15 0957  Weight: 140 lb (63.5 kg)   Body mass index is 23.3 kg/m.   Physical Exam Constitutional: She appears well-developed and well-nourished. No distress.  HENT:  Head: Normocephalic and atraumatic.  Right Ear: External ear normal. Normal ear canal and TM Left Ear: External ear normal.  Normal ear canal and TM Mouth/Throat: Oropharynx is clear and moist.  Eyes: Conjunctivae and EOM are normal.  Neck: Neck supple. No tracheal deviation present. No thyromegaly present.  No carotid bruit  Cardiovascular: Normal rate, regular rhythm and normal heart sounds.   No murmur heard.  No edema. Pulmonary/Chest: Effort normal and breath sounds normal. No respiratory distress. She has no wheezes. She has no rales.  Breast: deferred to Gyn Abdominal: Soft. She exhibits no distension. There is no tenderness.  Lymphadenopathy: She has no cervical adenopathy.  Skin: Skin is warm and dry. She is not diaphoretic.  Psychiatric: She has a normal mood and affect. Her behavior is normal.         Assessment & Plan:   Physical exam: Screening blood work  ordered Immunizations discussed PNA, shingles vaccines, already had the flu vaccine Colonoscopy  Up to date  Mammogram  Up to date  Gyn  Up to date  Dexa -  Due - ordered Eye exams Up to date  Exercise - active with yard work, house work - advised regular exercise Weight - normal BMI Skin  - no concerns Substance abuse - none  See Problem List for Assessment and Plan of chronic medical problems.  F/u annually

## 2015-12-05 ENCOUNTER — Encounter: Payer: Self-pay | Admitting: Internal Medicine

## 2015-12-05 ENCOUNTER — Other Ambulatory Visit (INDEPENDENT_AMBULATORY_CARE_PROVIDER_SITE_OTHER): Payer: BLUE CROSS/BLUE SHIELD

## 2015-12-05 ENCOUNTER — Ambulatory Visit (INDEPENDENT_AMBULATORY_CARE_PROVIDER_SITE_OTHER): Payer: BLUE CROSS/BLUE SHIELD | Admitting: Internal Medicine

## 2015-12-05 VITALS — BP 126/82 | HR 51 | Temp 98.1°F | Resp 16 | Ht 65.0 in | Wt 140.0 lb

## 2015-12-05 DIAGNOSIS — Z1159 Encounter for screening for other viral diseases: Secondary | ICD-10-CM

## 2015-12-05 DIAGNOSIS — Z853 Personal history of malignant neoplasm of breast: Secondary | ICD-10-CM

## 2015-12-05 DIAGNOSIS — Z0000 Encounter for general adult medical examination without abnormal findings: Secondary | ICD-10-CM

## 2015-12-05 DIAGNOSIS — M810 Age-related osteoporosis without current pathological fracture: Secondary | ICD-10-CM

## 2015-12-05 LAB — COMPREHENSIVE METABOLIC PANEL
ALBUMIN: 4.5 g/dL (ref 3.5–5.2)
ALK PHOS: 57 U/L (ref 39–117)
ALT: 13 U/L (ref 0–35)
AST: 15 U/L (ref 0–37)
BILIRUBIN TOTAL: 0.7 mg/dL (ref 0.2–1.2)
BUN: 15 mg/dL (ref 6–23)
CALCIUM: 9.7 mg/dL (ref 8.4–10.5)
CO2: 30 meq/L (ref 19–32)
CREATININE: 0.7 mg/dL (ref 0.40–1.20)
Chloride: 104 mEq/L (ref 96–112)
GFR: 89.07 mL/min (ref >60.00)
Glucose, Bld: 93 mg/dL (ref 70–99)
Potassium: 4.1 mEq/L (ref 3.5–5.1)
Sodium: 141 mEq/L (ref 135–145)
TOTAL PROTEIN: 6.6 g/dL (ref 6.0–8.3)

## 2015-12-05 LAB — LIPID PANEL
Cholesterol: 186 mg/dL (ref 0–200)
HDL: 80.1 mg/dL (ref >39.00)
LDL Cholesterol: 94 mg/dL (ref 0–99)
NONHDL: 106.37
TRIGLYCERIDES: 63 mg/dL (ref 0.0–149.0)
Total CHOL/HDL Ratio: 2
VLDL: 12.6 mg/dL (ref 0.0–40.0)

## 2015-12-05 LAB — CBC WITH DIFFERENTIAL/PLATELET
Basophils Absolute: 0 10*3/uL (ref 0.0–0.1)
Basophils Relative: 0.3 % (ref 0.0–3.0)
EOS ABS: 0.1 10*3/uL (ref 0.0–0.7)
Eosinophils Relative: 1.3 % (ref 0.0–5.0)
HCT: 38.7 % (ref 36.0–46.0)
Hemoglobin: 13.4 g/dL (ref 12.0–15.0)
LYMPHS PCT: 29.2 % (ref 12.0–46.0)
Lymphs Abs: 1.5 10*3/uL (ref 0.7–4.0)
MCHC: 34.6 g/dL (ref 30.0–36.0)
MCV: 97.8 fl (ref 78.0–100.0)
MONOS PCT: 6.8 % (ref 3.0–12.0)
Monocytes Absolute: 0.4 10*3/uL (ref 0.1–1.0)
NEUTROS PCT: 62.4 % (ref 43.0–77.0)
Neutro Abs: 3.3 10*3/uL (ref 1.4–7.7)
Platelets: 198 10*3/uL (ref 150.0–400.0)
RBC: 3.96 Mil/uL (ref 3.87–5.11)
RDW: 13.8 % (ref 11.5–15.5)
WBC: 5.2 10*3/uL (ref 4.0–10.5)

## 2015-12-05 LAB — TSH: TSH: 1.73 u[IU]/mL (ref 0.35–4.50)

## 2015-12-05 NOTE — Assessment & Plan Note (Signed)
dexa due this year - ordered  Taking calcium and vitamin d Active, but stressed regular exercise - will be retiring in next year and will have more time for exercise

## 2015-12-05 NOTE — Patient Instructions (Addendum)
Test(s) ordered today. Your results will be released to [facility] [place] (or called to you) after review, usually within 72hours after test completion. If any changes need to be made, you will be notified at that same time.  All other Health Maintenance issues reviewed.   All recommended immunizations and age-appropriate screenings are up-to-date or discussed.  No immunizations administered today.  Consider getting the shingles and pneumonia vaccines.   Medications reviewed and updated.  No changes recommended at this time.   A bone density scan was ordered.   Please followup in one year   Health Maintenance, Female Adopting a healthy lifestyle and getting preventive care can go a long way to promote health and wellness. Talk with your health care provider about what schedule of regular examinations is right for you. This is a good chance for you to check in with your provider about disease prevention and staying healthy. In between checkups, there are plenty of things you can do on your own. Experts have done a lot of research about which lifestyle changes and preventive measures are most likely to keep you healthy. Ask your health care provider for more information. WEIGHT AND DIET  Eat a healthy diet  Be sure to include plenty of vegetables, fruits, low-fat dairy products, and lean protein.  Do not eat a lot of foods high in solid fats, added sugars, or salt.  Get regular exercise. This is one of the most important things you can do for your health.  Most adults should exercise for at least 150 minutes each week. The exercise should increase your heart rate and make you sweat (moderate-intensity exercise).  Most adults should also do strengthening exercises at least twice a week. This is in addition to the moderate-intensity exercise.  Maintain a healthy weight  Body mass index (BMI) is a measurement that can be used to identify possible weight problems. It estimates body fat based on  height and weight. Your health care provider can help determine your BMI and help you achieve or maintain a healthy weight.  For females 54 years of age and older:   A BMI below 18.5 is considered underweight.  A BMI of 18.5 to 24.9 is normal.  A BMI of 25 to 29.9 is considered overweight.  A BMI of 30 and above is considered obese.  Watch levels of cholesterol and blood lipids  You should start having your blood tested for lipids and cholesterol at 65 years of age, then have this test every 5 years.  You may need to have your cholesterol levels checked more often if:  Your lipid or cholesterol levels are high.  You are older than 65 years of age.  You are at high risk for heart disease.  CANCER SCREENING   Lung Cancer  Lung cancer screening is recommended for adults 52-89 years old who are at high risk for lung cancer because of a history of smoking.  A yearly low-dose CT scan of the lungs is recommended for people who:  Currently smoke.  Have quit within the past 15 years.  Have at least a 30-pack-year history of smoking. A pack year is smoking an average of one pack of cigarettes a day for 1 year.  Yearly screening should continue until it has been 15 years since you quit.  Yearly screening should stop if you develop a health problem that would prevent you from having lung cancer treatment.  Breast Cancer  Practice breast self-awareness. This means understanding how your breasts normally  appear and feel.  It also means doing regular breast self-exams. Let your health care provider know about any changes, no matter how small.  If you are in your 20s or 30s, you should have a clinical breast exam (CBE) by a health care provider every 1-3 years as part of a regular health exam.  If you are 47 or older, have a CBE every year. Also consider having a breast X-ray (mammogram) every year.  If you have a family history of breast cancer, talk to your health care  provider about genetic screening.  If you are at high risk for breast cancer, talk to your health care provider about having an MRI and a mammogram every year.  Breast cancer gene (BRCA) assessment is recommended for women who have family members with BRCA-related cancers. BRCA-related cancers include:  Breast.  Ovarian.  Tubal.  Peritoneal cancers.  Results of the assessment will determine the need for genetic counseling and BRCA1 and BRCA2 testing. Cervical Cancer Your health care provider may recommend that you be screened regularly for cancer of the pelvic organs (ovaries, uterus, and vagina). This screening involves a pelvic examination, including checking for microscopic changes to the surface of your cervix (Pap test). You may be encouraged to have this screening done every 3 years, beginning at age 44.  For women ages 38-65, health care providers may recommend pelvic exams and Pap testing every 3 years, or they may recommend the Pap and pelvic exam, combined with testing for human papilloma virus (HPV), every 5 years. Some types of HPV increase your risk of cervical cancer. Testing for HPV may also be done on women of any age with unclear Pap test results.  Other health care providers may not recommend any screening for nonpregnant women who are considered low risk for pelvic cancer and who do not have symptoms. Ask your health care provider if a screening pelvic exam is right for you.  If you have had past treatment for cervical cancer or a condition that could lead to cancer, you need Pap tests and screening for cancer for at least 20 years after your treatment. If Pap tests have been discontinued, your risk factors (such as having a new sexual partner) need to be reassessed to determine if screening should resume. Some women have medical problems that increase the chance of getting cervical cancer. In these cases, your health care provider may recommend more frequent screening and  Pap tests. Colorectal Cancer  This type of cancer can be detected and often prevented.  Routine colorectal cancer screening usually begins at 65 years of age and continues through 65 years of age.  Your health care provider may recommend screening at an earlier age if you have risk factors for colon cancer.  Your health care provider may also recommend using home test kits to check for hidden blood in the stool.  A small camera at the end of a tube can be used to examine your colon directly (sigmoidoscopy or colonoscopy). This is done to check for the earliest forms of colorectal cancer.  Routine screening usually begins at age 102.  Direct examination of the colon should be repeated every 5-10 years through 65 years of age. However, you may need to be screened more often if early forms of precancerous polyps or small growths are found. Skin Cancer  Check your skin from head to toe regularly.  Tell your health care provider about any new moles or changes in moles, especially if there is  a change in a mole's shape or color.  Also tell your health care provider if you have a mole that is larger than the size of a pencil eraser.  Always use sunscreen. Apply sunscreen liberally and repeatedly throughout the day.  Protect yourself by wearing long sleeves, pants, a wide-brimmed hat, and sunglasses whenever you are outside. HEART DISEASE, DIABETES, AND HIGH BLOOD PRESSURE   High blood pressure causes heart disease and increases the risk of stroke. High blood pressure is more likely to develop in:  People who have blood pressure in the high end of the normal range (130-139/85-89 mm Hg).  People who are overweight or obese.  People who are African American.  If you are 61-33 years of age, have your blood pressure checked every 3-5 years. If you are 39 years of age or older, have your blood pressure checked every year. You should have your blood pressure measured twice--once when you are at  a hospital or clinic, and once when you are not at a hospital or clinic. Record the average of the two measurements. To check your blood pressure when you are not at a hospital or clinic, you can use:  An automated blood pressure machine at a pharmacy.  A home blood pressure monitor.  If you are between 46 years and 49 years old, ask your health care provider if you should take aspirin to prevent strokes.  Have regular diabetes screenings. This involves taking a blood sample to check your fasting blood sugar level.  If you are at a normal weight and have a low risk for diabetes, have this test once every three years after 65 years of age.  If you are overweight and have a high risk for diabetes, consider being tested at a younger age or more often. PREVENTING INFECTION  Hepatitis B  If you have a higher risk for hepatitis B, you should be screened for this virus. You are considered at high risk for hepatitis B if:  You were born in a country where hepatitis B is common. Ask your health care provider which countries are considered high risk.  Your parents were born in a high-risk country, and you have not been immunized against hepatitis B (hepatitis B vaccine).  You have HIV or AIDS.  You use needles to inject street drugs.  You live with someone who has hepatitis B.  You have had sex with someone who has hepatitis B.  You get hemodialysis treatment.  You take certain medicines for conditions, including cancer, organ transplantation, and autoimmune conditions. Hepatitis C  Blood testing is recommended for:  Everyone born from [birth year] through 1965.  Anyone with known risk factors for hepatitis C. Sexually transmitted infections (STIs)  You should be screened for sexually transmitted infections (STIs) including gonorrhea and chlamydia if:  You are sexually active and are younger than 65 years of age.  You are older than 65 years of age and your health care provider tells you  that you are at risk for this type of infection.  Your sexual activity has changed since you were last screened and you are at an increased risk for chlamydia or gonorrhea. Ask your health care provider if you are at risk.  If you do not have HIV, but are at risk, it may be recommended that you take a prescription medicine daily to prevent HIV infection. This is called pre-exposure prophylaxis (PrEP). You are considered at risk if:  You are sexually active and do not regularly  use condoms or know the HIV status of your partner(s).  You take drugs by injection.  You are sexually active with a partner who has HIV. Talk with your health care provider about whether you are at high risk of being infected with HIV. If you choose to begin PrEP, you should first be tested for HIV. You should then be tested every 3 months for as long as you are taking PrEP.  PREGNANCY   If you are premenopausal and you may become pregnant, ask your health care provider about preconception counseling.  If you may become pregnant, take 400 to 800 micrograms (mcg) of folic acid every day.  If you want to prevent pregnancy, talk to your health care provider about birth control (contraception). OSTEOPOROSIS AND MENOPAUSE   Osteoporosis is a disease in which the bones lose minerals and strength with aging. This can result in serious bone fractures. Your risk for osteoporosis can be identified using a bone density scan.  If you are 27 years of age or older, or if you are at risk for osteoporosis and fractures, ask your health care provider if you should be screened.  Ask your health care provider whether you should take a calcium or vitamin D supplement to lower your risk for osteoporosis.  Menopause may have certain physical symptoms and risks.  Hormone replacement therapy may reduce some of these symptoms and risks. Talk to your health care provider about whether hormone replacement therapy is right for you.  HOME  CARE INSTRUCTIONS   Schedule regular health, dental, and eye exams.  Stay current with your immunizations.   Do not use any tobacco products including cigarettes, chewing tobacco, or electronic cigarettes.  If you are pregnant, do not drink alcohol.  If you are breastfeeding, limit how much and how often you drink alcohol.  Limit alcohol intake to no more than 1 drink per day for nonpregnant women. One drink equals 12 ounces of beer, 5 ounces of wine, or 1 ounces of hard liquor.  Do not use street drugs.  Do not share needles.  Ask your health care provider for help if you need support or information about quitting drugs.  Tell your health care provider if you often feel depressed.  Tell your health care provider if you have ever been abused or do not feel safe at home.   This information is not intended to replace advice given to you by your health care provider. Make sure you discuss any questions you have with your health care provider.   Document Released: 07/31/2010 Document Revised: 02/05/2014 Document Reviewed: 12/17/2012 Elsevier Interactive Patient Education [employer].

## 2015-12-05 NOTE — Progress Notes (Signed)
Pre visit review using our clinic review tool, if applicable. No additional management support is needed unless otherwise documented below in the visit note. 

## 2015-12-05 NOTE — Assessment & Plan Note (Signed)
Following with oncology No evidence of recurrence

## 2015-12-06 LAB — HEPATITIS C ANTIBODY: HCV AB: NEGATIVE

## 2015-12-08 ENCOUNTER — Encounter: Payer: Self-pay | Admitting: Internal Medicine

## 2016-01-09 ENCOUNTER — Ambulatory Visit (INDEPENDENT_AMBULATORY_CARE_PROVIDER_SITE_OTHER): Payer: BLUE CROSS/BLUE SHIELD | Admitting: *Deleted

## 2016-01-09 ENCOUNTER — Ambulatory Visit (INDEPENDENT_AMBULATORY_CARE_PROVIDER_SITE_OTHER)
Admission: RE | Admit: 2016-01-09 | Discharge: 2016-01-09 | Disposition: A | Discharge status: Home / Self Care | Payer: BLUE CROSS/BLUE SHIELD | Source: Ambulatory Visit | Attending: Internal Medicine | Admitting: Internal Medicine

## 2016-01-09 DIAGNOSIS — Z2911 Encounter for prophylactic immunotherapy for respiratory syncytial virus (RSV): Secondary | ICD-10-CM | POA: Diagnosis not present

## 2016-01-09 DIAGNOSIS — M810 Age-related osteoporosis without current pathological fracture: Secondary | ICD-10-CM

## 2016-01-09 DIAGNOSIS — Z23 Encounter for immunization: Secondary | ICD-10-CM

## 2016-01-15 DIAGNOSIS — M810 Age-related osteoporosis without current pathological fracture: Secondary | ICD-10-CM | POA: Diagnosis not present

## 2016-01-17 ENCOUNTER — Encounter: Payer: Self-pay | Admitting: Internal Medicine

## 2016-05-07 ENCOUNTER — Other Ambulatory Visit: Payer: Self-pay | Admitting: Nurse Practitioner

## 2016-05-07 DIAGNOSIS — Z1231 Encounter for screening mammogram for malignant neoplasm of breast: Secondary | ICD-10-CM

## 2016-05-08 DIAGNOSIS — Z1389 Encounter for screening for other disorder: Secondary | ICD-10-CM | POA: Diagnosis not present

## 2016-05-08 DIAGNOSIS — Z130 Encounter for screening for diseases of the blood and blood-forming organs and certain disorders involving the immune mechanism: Secondary | ICD-10-CM | POA: Diagnosis not present

## 2016-05-08 DIAGNOSIS — Z6823 Body mass index (BMI) 23.0-23.9, adult: Secondary | ICD-10-CM | POA: Diagnosis not present

## 2016-05-08 DIAGNOSIS — Z01419 Encounter for gynecological examination (general) (routine) without abnormal findings: Secondary | ICD-10-CM | POA: Diagnosis not present

## 2016-05-08 DIAGNOSIS — N3281 Overactive bladder: Secondary | ICD-10-CM | POA: Diagnosis not present

## 2016-05-08 DIAGNOSIS — L988 Other specified disorders of the skin and subcutaneous tissue: Secondary | ICD-10-CM | POA: Diagnosis not present

## 2016-06-06 ENCOUNTER — Ambulatory Visit
Admission: RE | Admit: 2016-06-06 | Discharge: 2016-06-06 | Disposition: A | Discharge status: Home / Self Care | Payer: PPO | Source: Ambulatory Visit | Attending: Adult Health | Admitting: Adult Health

## 2016-06-06 ENCOUNTER — Ambulatory Visit
Admission: RE | Admit: 2016-06-06 | Discharge: 2016-06-06 | Disposition: A | Discharge status: Home / Self Care | Payer: BLUE CROSS/BLUE SHIELD | Source: Ambulatory Visit | Attending: Nurse Practitioner | Admitting: Nurse Practitioner

## 2016-06-06 ENCOUNTER — Other Ambulatory Visit: Payer: Self-pay | Admitting: Adult Health

## 2016-06-06 DIAGNOSIS — Z171 Estrogen receptor negative status [ER-]: Principal | ICD-10-CM

## 2016-06-06 DIAGNOSIS — C50412 Malignant neoplasm of upper-outer quadrant of left female breast: Secondary | ICD-10-CM

## 2016-06-06 DIAGNOSIS — Z1231 Encounter for screening mammogram for malignant neoplasm of breast: Secondary | ICD-10-CM

## 2016-06-06 DIAGNOSIS — R928 Other abnormal and inconclusive findings on diagnostic imaging of breast: Secondary | ICD-10-CM | POA: Diagnosis not present

## 2016-06-06 HISTORY — DX: Personal history of irradiation: Z92.3

## 2016-10-22 DIAGNOSIS — H43811 Vitreous degeneration, right eye: Secondary | ICD-10-CM | POA: Diagnosis not present

## 2016-10-22 DIAGNOSIS — H1131 Conjunctival hemorrhage, right eye: Secondary | ICD-10-CM | POA: Diagnosis not present

## 2016-10-22 DIAGNOSIS — H2513 Age-related nuclear cataract, bilateral: Secondary | ICD-10-CM | POA: Diagnosis not present

## 2016-11-14 NOTE — Progress Notes (Signed)
CLINIC:  Survivorship   REASON FOR VISIT:  Routine follow-up for history of breast cancer.   BRIEF ONCOLOGIC HISTORY:    Breast cancer of upper-outer quadrant of left female breast ([place])   06/11/2013 Initial Biopsy    Left breast core needle bx: DCIS, high grade, with comedonecrosis, ER- (0%), PR- (0%)      07/13/2013 Definitive Surgery    Left lumpectomy/SLNB: DCIS, high grade, comedonecrosis. 0/3 LN      07/13/2013 Pathologic Stage    Stage 0: Tis N0      09/08/2013 - 10/07/2013 Radiation Therapy    Adjuvant RT: Left breast: 40.05 Gy over 15 fractions; left breast bost: 10 Gy over 5 fractions; total dose 50.05 Gy        INTERVAL HISTORY:  Sherry Wells presents to the [facility] today for routine follow-up for her history of breast cancer.  Overall, she reports feeling quite well. She does have occasional soreness in her breast from surgery, but otherwise has no issues.  She sees her PCP regularly, along with her GYN.  She exercises daily.  She is up to date with her cancer screenings.     REVIEW OF SYSTEMS:  Review of Systems  Constitutional: Negative for appetite change, chills, fatigue, fever and unexpected weight change.  HENT:   Negative for hearing loss and lump/mass.   Eyes: Negative for eye problems and icterus.  Respiratory: Negative for chest tightness, cough, shortness of breath and wheezing.   Cardiovascular: Negative for chest pain, leg swelling and palpitations.  Gastrointestinal: Negative for abdominal distention, abdominal pain, blood in stool, constipation, diarrhea, nausea and vomiting.  Endocrine: Negative for hot flashes.  Genitourinary: Negative for difficulty urinating.   Skin: Negative for itching and rash.  Neurological: Negative for dizziness, extremity weakness, headaches and numbness.  Hematological: Negative for adenopathy. Does not bruise/bleed easily.  Psychiatric/Behavioral: Negative for depression. The patient is not  nervous/anxious.   Breast: Denies any new nodularity, masses, tenderness, nipple changes, or nipple discharge.       PAST MEDICAL/SURGICAL HISTORY:  Past Medical History:  Diagnosis Date  . Breast cancer ([place]) 06/2013   left UOQ  . Fibrocystic breast disease   . Hyperlipidemia   . Osteoporosis   . Personal history of radiation therapy   . PONV (postoperative nausea and vomiting)   . S/P radiation therapy 09/08/2013-10/07/2013    1) Left breast / 40.05Gy in 15 fractions/ 2) Left Breast Boost / 10 Gy in 5 fractions   Past Surgical History:  Procedure Laterality Date  . BREAST BIOPSY Left 2015  . BREAST CYST ASPIRATION Left 1980  . BREAST EXCISIONAL BIOPSY Left 1985  . BREAST LUMPECTOMY  1980   cyst  . BREAST LUMPECTOMY Left 07/13/2013  . COLONOSCOPY  2002 & 2013   negative; [facility]  . DILATION AND CURETTAGE OF UTERUS     x2  . TUBAL LIGATION  1994     ALLERGIES:  No Known Allergies   CURRENT MEDICATIONS:  Outpatient Encounter Prescriptions as of 11/15/2016  Medication Sig Note  . Calcium Carbonate-Vitamin D (CALTRATE 600+D) 600-400 MG-UNIT per tablet Take 1 tablet by mouth 2 (two) times daily.   . cholecalciferol (VITAMIN D) 400 UNITS TABS Take 400 Units by mouth daily.   Marland Kitchen doxycycline (ORACEA) 40 MG capsule Take 40 mg by mouth every morning. As needed for rosacea 10/30/2013: Dr Dayton Martes, Derm Taken prn  . Multiple Vitamins-Minerals (MULTIVITAMIN WITH MINERALS) tablet Take 1 tablet by mouth daily.   Marland Kitchen  oxybutynin (DITROPAN-XL) 10 MG 24 hr tablet Take 10 mg by mouth daily. 10/17/2011: Dr Sherren Mocha Meisinger, Gyn  . vitamin C (ASCORBIC ACID) 500 MG tablet Take 500 mg by mouth daily.    No facility-administered encounter medications on file as of 11/15/2016.      ONCOLOGIC FAMILY HISTORY:  Family History  Problem Relation Age of Onset  . Diabetes Mother   . Transient ischemic attack Mother        in 68s  . Hypertension Mother   . Valvular heart disease Father         Aortic valve  . Osteoporosis Sister   . Cardiomyopathy Sister   . Throat cancer Maternal Uncle   . Cancer Maternal Uncle        ? primary  . Valvular heart disease Paternal Uncle        Aortic valve    GENETIC COUNSELING/TESTING: Not at this time  SOCIAL HISTORY:  Sherry Wells is married and lives with her husband in [name], [place].  She has 1 stepdaughter and grandson and they live in [place], [place].  Sherry Wells is currently retired.  She denies any current or history of tobacco, alcohol, or illicit drug use.     PHYSICAL EXAMINATION:  Vital Signs: Vitals:   11/15/16 1119  BP: (!) 142/65  Pulse: (!) 58  Resp: 20  Temp: 98 F (36.7 C)  SpO2: 100%   Filed Weights   11/15/16 1119  Weight: 137 lb 14.4 oz (62.6 kg)   General: Well-nourished, well-appearing female in no acute distress.  Unaccompanied today.   HEENT: Head is normocephalic.  Pupils equal and reactive to light. Conjunctivae clear without exudate.  Sclerae anicteric. Oral mucosa is pink, moist.  Oropharynx is pink without lesions or erythema.  Lymph: No cervical, supraclavicular, or infraclavicular lymphadenopathy noted on palpation.  Cardiovascular: Regular rate and rhythm.Marland Kitchen Respiratory: Clear to auscultation bilaterally. Chest expansion symmetric; breathing non-labored.  Breast Exam:  -Left breast: No appreciable masses on palpation. No skin redness, thickening, or peau d'orange appearance; no nipple retraction or nipple discharge; mild distortion in symmetry at previous lumpectomy site well healed scar without erythema or nodularity.  -Right breast: No appreciable masses on palpation. No skin redness, thickening, or peau d'orange appearance; no nipple retraction or nipple discharge;  -Axilla: No axillary adenopathy bilaterally.  GI: Abdomen soft and round; non-tender, non-distended. Bowel sounds normoactive. No hepatosplenomegaly.   GU: Deferred.  Neuro: No focal deficits. Steady gait.  Psych: Mood and  affect normal and appropriate for situation.  MSK: No focal spinal tenderness to palpation, full range of motion in bilateral upper extremities Extremities: No edema. Skin: Warm and dry.  LABORATORY DATA:  None for this visit   DIAGNOSTIC IMAGING:  Most recent mammogram:      ASSESSMENT AND PLAN:  Ms.. Wells is a pleasant 66 y.o. female with history of Stage 0 left breast DCIS, ER-/PR-, diagnosed in 05/2013, treated with lumpectomy and adjuvant radiation therapy.  She presents to the [facility]orship [facility] for surveillance and routine follow-up.   1. History of breast cancer:  Sherry Wells is currently clinically and radiographically without evidence of disease or recurrence of breast cancer. She will be due for mammogram in 05/2017; orders placed today.  I encouraged her to call me with any questions or concerns before her next visit at the cancer center, and I would be happy to see her sooner, if needed.    2. Bone health:  Given Sherry Wells's age,  history of breast cancer, she is at risk for bone demineralization. Her last DEXA scan was on 01/09/2016 and was consistent with osteoporosis with a T score of -2.6 in her right femoral neck.  She was encouraged to continue with consumption of foods rich in calcium, as well as performing weight-bearing activities.  She was given education on specific food and activities to promote bone health.  3. Cancer screening:  Due to Sherry Wells's history and her age, she should receive screening for skin cancers, colon cancer, and gynecologic cancers. She was encouraged to follow-up with her PCP for appropriate cancer screenings.   4. Health maintenance and wellness promotion: Sherry Wells was encouraged to consume 5-7 servings of fruits and vegetables per day. She was also encouraged to engage in moderate to vigorous exercise for 30 minutes per day most days of the week. She was instructed to limit her alcohol consumption and continue to abstain from tobacco  use.    Dispo:  -Return to cancer center in one year for LTS follow up -Mammogram in 05/2017   A total of (30) minutes of face-to-face time was spent with this patient with greater than 50% of that time in counseling and care-coordination.   Gardenia Phlegm, NP Survivorship Program [facility] [phone]   Note: PRIMARY CARE PROVIDER Binnie Rail, Melissa [phone]

## 2016-11-15 ENCOUNTER — Telehealth: Payer: Self-pay | Admitting: Adult Health

## 2016-11-15 ENCOUNTER — Ambulatory Visit (HOSPITAL_BASED_OUTPATIENT_CLINIC_OR_DEPARTMENT_OTHER): Payer: PPO | Admitting: Adult Health

## 2016-11-15 ENCOUNTER — Encounter: Payer: Self-pay | Admitting: Adult Health

## 2016-11-15 VITALS — BP 142/65 | HR 58 | Temp 98.0°F | Resp 20 | Ht 65.0 in | Wt 137.9 lb

## 2016-11-15 DIAGNOSIS — Z170 Estrogen receptor positive status [ER+]: Principal | ICD-10-CM

## 2016-11-15 DIAGNOSIS — C50412 Malignant neoplasm of upper-outer quadrant of left female breast: Secondary | ICD-10-CM

## 2016-11-15 DIAGNOSIS — Z86000 Personal history of in-situ neoplasm of breast: Secondary | ICD-10-CM

## 2016-11-15 NOTE — Telephone Encounter (Signed)
Scheduled appt per 10/18 los. Patient did not want avs or calendar.

## 2016-11-22 ENCOUNTER — Encounter: Payer: BLUE CROSS/BLUE SHIELD | Admitting: Adult Health

## 2016-12-05 NOTE — Progress Notes (Signed)
Subjective:    Patient ID: Sherry Wells, female    DOB: [birth year], 66 y.o.   MRN: [id number]  HPI Here for a welcome to medicare wellness exam and annual physical exam  I have personally reviewed and have noted 1.The patient's medical and social history 2.Their use of alcohol, tobacco or illicit drugs 3.Their current medications and supplements 4.The patient's functional ability including ADL's, fall risks, home                 safety risk and hearing or visual impairment. 5.Diet and physical activities 6.Evidence for depression or mood disorders 7.Care team reviewed  - gyn - dr Willis Modena, oncology - Wilber Bihari NP    Are there smokers in your home (other than you)? Yes - husband smokes outside  Risk Factors Exercise: walking 30 min - 1 hr daily Dietary issues discussed:   Well balanced, eating out more but eating home most of the time  Vitamin and supplement use: Taking calcium, vitamin D and multivitamin along with vitamin C.  Okay to continue all current supplementation.  Discussed adding B complex  Opiod use:     none Side effects from medication:  n/a Does medications benefits outweigh risks/side effects:    n/a  Cardiac risk factors: advanced age   Depression Screen  Have you felt down, depressed or hopeless? No  Have you felt little interest or pleasure in doing things?  No  Activities of Daily Living In your present state of health, do you have any difficulty performing the following activities?:  Driving? No Managing money?  No Feeding yourself? No Getting from bed to chair? No Climbing a flight of stairs? No Preparing food and eating?: No Bathing or showering? No Getting dressed: No Getting to/using the toilet? No Moving around from place to place: No In the past year have you fallen or had a near fall?: No   Do you have more than one partner?  No  Hearing Difficulties: No Do you  often ask people to speak up or repeat themselves? No Do you experience ringing or noises in your ears? No Do you have difficulty understanding soft or whispered voices? No Vision:              Any change in vision: no             Up to date with eye exam:   yes  Memory:  Do you feel that you have a problem with memory? No-feels memory is good  Do you often misplace items? No  Do you feel safe at home?  Yes  Cognitive Testing  Alert, Orientated? Yes  Normal Appearance? Yes  Recall of three objects?  Yes  Can perform simple calculations? Yes  Displays appropriate judgment? Yes  Can read the correct time from a watch face? Yes   Advanced Directives have been discussed with the patient? Yes      Medications and allergies reviewed with patient and updated if appropriate.  Patient Active Problem List   Diagnosis Date Noted  . History of breast cancer 12/05/2015  . Breast cancer of upper-outer quadrant of left female breast ([place]) 05/16/2015  . Breast cancer in situ 06/13/2013  . Elevated HDL 09/22/2008  . Osteoporosis 09/22/2008    Current Outpatient Medications on File Prior to Visit  Medication Sig Dispense Refill  . Calcium Carbonate-Vitamin D (CALTRATE 600+D) 600-400 MG-UNIT per tablet Take 1 tablet by mouth 2 (two) times daily.    Marland Kitchen  cholecalciferol (VITAMIN D) 400 UNITS TABS Take 400 Units by mouth daily.    Marland Kitchen doxycycline (ORACEA) 40 MG capsule Take 40 mg by mouth every morning. As needed for rosacea    . Multiple Vitamins-Minerals (MULTIVITAMIN WITH MINERALS) tablet Take 1 tablet by mouth daily.    Marland Kitchen oxybutynin (DITROPAN-XL) 10 MG 24 hr tablet Take 10 mg by mouth daily.    . vitamin C (ASCORBIC ACID) 500 MG tablet Take 500 mg by mouth daily.     No current facility-administered medications on file prior to visit.     Past Medical History:  Diagnosis Date  . Breast cancer ([place]) 06/2013   left UOQ  . Fibrocystic breast disease   . Hyperlipidemia   . Osteoporosis   .  Personal history of radiation therapy   . PONV (postoperative nausea and vomiting)   . S/P radiation therapy 09/08/2013-10/07/2013    1) Left breast / 40.05Gy in 15 fractions/ 2) Left Breast Boost / 10 Gy in 5 fractions    Past Surgical History:  Procedure Laterality Date  . BREAST BIOPSY Left 2015  . BREAST CYST ASPIRATION Left 1980  . BREAST EXCISIONAL BIOPSY Left 1985  . BREAST LUMPECTOMY  1980   cyst  . BREAST LUMPECTOMY Left 07/13/2013  . COLONOSCOPY  2002 & 2013   negative; [facility]  . DILATION AND CURETTAGE OF UTERUS     x2  . TUBAL LIGATION  1994    Social History   Socioeconomic History  . Marital status: Married    Spouse name: Not on file  . Number of children: Not on file  . Years of education: Not on file  . Highest education level: Not on file  Social Needs  . Financial resource strain: Not on file  . Food insecurity - worry: Not on file  . Food insecurity - inability: Not on file  . Transportation needs - medical: Not on file  . Transportation needs - non-medical: Not on file  Occupational History  . Not on file  Tobacco Use  . Smoking status: Never Smoker  . Smokeless tobacco: Never Used  Substance and Sexual Activity  . Alcohol use: No  . Drug use: No  . Sexual activity: Not on file    Comment: meanarche 40, G56, menopause age 84, no HRT  Other Topics Concern  . Not on file  Social History Narrative  . Not on file    Family History  Problem Relation Age of Onset  . Diabetes Mother   . Transient ischemic attack Mother        in 42s  . Hypertension Mother   . Valvular heart disease Father        Aortic valve  . Osteoporosis Sister   . Cardiomyopathy Sister   . Throat cancer Maternal Uncle   . Cancer Maternal Uncle        ? primary  . Valvular heart disease Paternal Uncle        Aortic valve    Review of Systems  Constitutional: Negative for appetite change, chills, fatigue and fever.  HENT: Negative for hearing loss and tinnitus.     Eyes: Negative for visual disturbance.  Respiratory: Negative for cough, shortness of breath and wheezing.   Cardiovascular: Negative for chest pain, palpitations and leg swelling.  Gastrointestinal: Negative for abdominal pain, blood in stool, constipation, diarrhea and nausea.       No gerd  Genitourinary: Negative for dysuria and hematuria.  Musculoskeletal: Positive for  arthralgias (knees b/l). Negative for back pain.  Skin: Negative for color change and rash.  Neurological: Negative for dizziness, light-headedness and numbness.  Psychiatric/Behavioral: Negative for dysphoric mood and sleep disturbance. The patient is not nervous/anxious.        Objective:   Vitals:   12/06/16 0918  BP: 132/76  Pulse: (!) 58  Resp: 16  Temp: 98.3 F (36.8 C)  SpO2: 98%   Filed Weights   12/06/16 0918  Weight: 138 lb (62.6 kg)   Body mass index is 22.96 kg/m.  Wt Readings from Last 3 Encounters:  12/06/16 138 lb (62.6 kg)  11/15/16 137 lb 14.4 oz (62.6 kg)  12/05/15 140 lb (63.5 kg)    Visual Acuity Screening   Right eye Left eye Both eyes  Without correction: 20/30 20/30 20/25   With correction:         Physical Exam Constitutional: She appears well-developed and well-nourished. No distress.  HENT:  Head: Normocephalic and atraumatic.  Right Ear: External ear normal. Normal ear canal and TM Left Ear: External ear normal.  Normal ear canal and TM Mouth/Throat: Oropharynx is clear and moist.  Eyes: Conjunctivae and EOM are normal.  Neck: Neck supple. No tracheal deviation present. No thyromegaly present.  No carotid bruit  Cardiovascular: Normal rate, regular rhythm and normal heart sounds.   No murmur heard.  No edema. Pulmonary/Chest: Effort normal and breath sounds normal. No respiratory distress. She has no wheezes. She has no rales.  Breast: deferred to Gyn Abdominal: Soft. She exhibits no distension. There is no tenderness.  Lymphadenopathy: She has no cervical  adenopathy.  Skin: Skin is warm and dry. She is not diaphoretic.  Psychiatric: She has a normal mood and affect. Her behavior is normal.        Assessment & Plan:   Wellness Exam: Immunizations influenza and prevnar today, discussed shingrix - wait until age 58 Colonoscopy  Up to date  Mammogram  Up to date  Gyn  Up to date  - 4/18 Dexa  Up to date  - due 12/2017 Eye exams up-to-date Hearing loss   none Memory concerns/difficulties    no memory concerns/difficulties Independent of ADLs    Fully independent EKG  Last done 2013-EKG today shows sinus bradycardia at 45 bpm, rate is variable.  Otherwise normal EKG.  Compared to 2013 the rate variability and bradycardia is new Stressed the importance of regular exercise   Patient received copy of preventative screening tests/immunizations recommended for the next 5-10 years.  Physical exam: Screening blood work  ordered Immunizations  lfu and prevnar due, discussed shingrix - wait until age 54 Colonoscopy  Up to date  Mammogram  Up to date  Gyn  Up to date  - 4/18 Dexa  Up to date  - due 12/2017 Eye exams    up-to-date EKG  Last done 2013-EKG today shows sinus bradycardia at 45 bpm, rate is variable.  Otherwise normal EKG.  Compared to 2013 the rate variability and bradycardia is new Exercise walking regularly Weight normal BMI  Skin   no skin concerns Substance abuse   none  See Problem List for Assessment and Plan of chronic medical problems.   FU in one year

## 2016-12-05 NOTE — Patient Instructions (Addendum)
Sherry Wells , Thank you for taking time to come for your Medicare Wellness Visit. I appreciate your ongoing commitment to your health goals. Please review the following plan we discussed and let me know if I can assist you in the future.   These are the goals we discussed: Goals    None      This is a list of the screening recommended for you and due dates:  Health Maintenance  Topic Date Due  . Pneumonia vaccines (1 of 2 - PCV13) Given today  . Mammogram  06/06/2017  . DEXA scan (bone density measurement)  01/08/2018  . Tetanus Vaccine  10/16/2021  . Colon Cancer Screening  11/21/2021  . Flu Shot  Completed  .  Hepatitis C: One time screening is recommended by Center for Disease Control  (CDC) for  adults born from [birth year] through 1965.   Completed   Test(s) ordered today. Your results will be released to [facility] (or called to you) after review, usually within 72hours after test completion. If any changes need to be made, you will be notified at that same time.  All other Health Maintenance issues reviewed.   All recommended immunizations and age-appropriate screenings are up-to-date or discussed.  prevnar immunization administered today.  An EKG was done today  Medications reviewed and updated.  No changes recommended at this time.   Please followup in one year   Health Maintenance, Female Adopting a healthy lifestyle and getting preventive care can go a long way to promote health and wellness. Talk with your health care provider about what schedule of regular examinations is right for you. This is a good chance for you to check in with your provider about disease prevention and staying healthy. In between checkups, there are plenty of things you can do on your own. Experts have done a lot of research about which lifestyle changes and preventive measures are most likely to keep you healthy. Ask your health care provider for more information. Weight and diet Eat a healthy  diet  Be sure to include plenty of vegetables, fruits, low-fat dairy products, and lean protein.  Do not eat a lot of foods high in solid fats, added sugars, or salt.  Get regular exercise. This is one of the most important things you can do for your health. ? Most adults should exercise for at least 150 minutes each week. The exercise should increase your heart rate and make you sweat (moderate-intensity exercise). ? Most adults should also do strengthening exercises at least twice a week. This is in addition to the moderate-intensity exercise.  Maintain a healthy weight  Body mass index (BMI) is a measurement that can be used to identify possible weight problems. It estimates body fat based on height and weight. Your health care provider can help determine your BMI and help you achieve or maintain a healthy weight.  For females 43 years of age and older: ? A BMI below 18.5 is considered underweight. ? A BMI of 18.5 to 24.9 is normal. ? A BMI of 25 to 29.9 is considered overweight. ? A BMI of 30 and above is considered obese.  Watch levels of cholesterol and blood lipids  You should start having your blood tested for lipids and cholesterol at 66 years of age, then have this test every 5 years.  You may need to have your cholesterol levels checked more often if: ? Your lipid or cholesterol levels are high. ? You are older than 66 years  of age. ? You are at high risk for heart disease.  Cancer screening Lung Cancer  Lung cancer screening is recommended for adults 107-41 years old who are at high risk for lung cancer because of a history of smoking.  A yearly low-dose CT scan of the lungs is recommended for people who: ? Currently smoke. ? Have quit within the past 15 years. ? Have at least a 30-pack-year history of smoking. A pack year is smoking an average of one pack of cigarettes a day for 1 year.  Yearly screening should continue until it has been 15 years since you  quit.  Yearly screening should stop if you develop a health problem that would prevent you from having lung cancer treatment.  Breast Cancer  Practice breast self-awareness. This means understanding how your breasts normally appear and feel.  It also means doing regular breast self-exams. Let your health care provider know about any changes, no matter how small.  If you are in your 20s or 30s, you should have a clinical breast exam (CBE) by a health care provider every 1-3 years as part of a regular health exam.  If you are 27 or older, have a CBE every year. Also consider having a breast X-ray (mammogram) every year.  If you have a family history of breast cancer, talk to your health care provider about genetic screening.  If you are at high risk for breast cancer, talk to your health care provider about having an MRI and a mammogram every year.  Breast cancer gene (BRCA) assessment is recommended for women who have family members with BRCA-related cancers. BRCA-related cancers include: ? Breast. ? Ovarian. ? Tubal. ? Peritoneal cancers.  Results of the assessment will determine the need for genetic counseling and BRCA1 and BRCA2 testing.  Cervical Cancer Your health care provider may recommend that you be screened regularly for cancer of the pelvic organs (ovaries, uterus, and vagina). This screening involves a pelvic examination, including checking for microscopic changes to the surface of your cervix (Pap test). You may be encouraged to have this screening done every 3 years, beginning at age 38.  For women ages 64-65, health care providers may recommend pelvic exams and Pap testing every 3 years, or they may recommend the Pap and pelvic exam, combined with testing for human papilloma virus (HPV), every 5 years. Some types of HPV increase your risk of cervical cancer. Testing for HPV may also be done on women of any age with unclear Pap test results.  Other health care providers  may not recommend any screening for nonpregnant women who are considered low risk for pelvic cancer and who do not have symptoms. Ask your health care provider if a screening pelvic exam is right for you.  If you have had past treatment for cervical cancer or a condition that could lead to cancer, you need Pap tests and screening for cancer for at least 20 years after your treatment. If Pap tests have been discontinued, your risk factors (such as having a new sexual partner) need to be reassessed to determine if screening should resume. Some women have medical problems that increase the chance of getting cervical cancer. In these cases, your health care provider may recommend more frequent screening and Pap tests.  Colorectal Cancer  This type of cancer can be detected and often prevented.  Routine colorectal cancer screening usually begins at 66 years of age and continues through 66 years of age.  Your health care provider  may recommend screening at an earlier age if you have risk factors for colon cancer.  Your health care provider may also recommend using home test kits to check for hidden blood in the stool.  A small camera at the end of a tube can be used to examine your colon directly (sigmoidoscopy or colonoscopy). This is done to check for the earliest forms of colorectal cancer.  Routine screening usually begins at age 3.  Direct examination of the colon should be repeated every 5-10 years through 66 years of age. However, you may need to be screened more often if early forms of precancerous polyps or small growths are found.  Skin Cancer  Check your skin from head to toe regularly.  Tell your health care provider about any new moles or changes in moles, especially if there is a change in a mole's shape or color.  Also tell your health care provider if you have a mole that is larger than the size of a pencil eraser.  Always use sunscreen. Apply sunscreen liberally and repeatedly  throughout the day.  Protect yourself by wearing long sleeves, pants, a wide-brimmed hat, and sunglasses whenever you are outside.  Heart disease, diabetes, and high blood pressure  High blood pressure causes heart disease and increases the risk of stroke. High blood pressure is more likely to develop in: ? People who have blood pressure in the high end of the normal range (130-139/85-89 mm Hg). ? People who are overweight or obese. ? People who are African American.  If you are 57-12 years of age, have your blood pressure checked every 3-5 years. If you are 64 years of age or older, have your blood pressure checked every year. You should have your blood pressure measured twice-once when you are at a hospital or clinic, and once when you are not at a hospital or clinic. Record the average of the two measurements. To check your blood pressure when you are not at a hospital or clinic, you can use: ? An automated blood pressure machine at a pharmacy. ? A home blood pressure monitor.  If you are between 14 years and 47 years old, ask your health care provider if you should take aspirin to prevent strokes.  Have regular diabetes screenings. This involves taking a blood sample to check your fasting blood sugar level. ? If you are at a normal weight and have a low risk for diabetes, have this test once every three years after 66 years of age. ? If you are overweight and have a high risk for diabetes, consider being tested at a younger age or more often. Preventing infection Hepatitis B  If you have a higher risk for hepatitis B, you should be screened for this virus. You are considered at high risk for hepatitis B if: ? You were born in a country where hepatitis B is common. Ask your health care provider which countries are considered high risk. ? Your parents were born in a high-risk country, and you have not been immunized against hepatitis B (hepatitis B vaccine). ? You have HIV or AIDS. ? You  use needles to inject street drugs. ? You live with someone who has hepatitis B. ? You have had sex with someone who has hepatitis B. ? You get hemodialysis treatment. ? You take certain medicines for conditions, including cancer, organ transplantation, and autoimmune conditions.  Hepatitis C  Blood testing is recommended for: ? Everyone born from [birth year] through 1965. ? Anyone with  known risk factors for hepatitis C.  Sexually transmitted infections (STIs)  You should be screened for sexually transmitted infections (STIs) including gonorrhea and chlamydia if: ? You are sexually active and are younger than 66 years of age. ? You are older than 66 years of age and your health care provider tells you that you are at risk for this type of infection. ? Your sexual activity has changed since you were last screened and you are at an increased risk for chlamydia or gonorrhea. Ask your health care provider if you are at risk.  If you do not have HIV, but are at risk, it may be recommended that you take a prescription medicine daily to prevent HIV infection. This is called pre-exposure prophylaxis (PrEP). You are considered at risk if: ? You are sexually active and do not regularly use condoms or know the HIV status of your partner(s). ? You take drugs by injection. ? You are sexually active with a partner who has HIV.  Talk with your health care provider about whether you are at high risk of being infected with HIV. If you choose to begin PrEP, you should first be tested for HIV. You should then be tested every 3 months for as long as you are taking PrEP. Pregnancy  If you are premenopausal and you may become pregnant, ask your health care provider about preconception counseling.  If you may become pregnant, take 400 to 800 micrograms (mcg) of folic acid every day.  If you want to prevent pregnancy, talk to your health care provider about birth control (contraception). Osteoporosis and  menopause  Osteoporosis is a disease in which the bones lose minerals and strength with aging. This can result in serious bone fractures. Your risk for osteoporosis can be identified using a bone density scan.  If you are 3 years of age or older, or if you are at risk for osteoporosis and fractures, ask your health care provider if you should be screened.  Ask your health care provider whether you should take a calcium or vitamin D supplement to lower your risk for osteoporosis.  Menopause may have certain physical symptoms and risks.  Hormone replacement therapy may reduce some of these symptoms and risks. Talk to your health care provider about whether hormone replacement therapy is right for you. Follow these instructions at home:  Schedule regular health, dental, and eye exams.  Stay current with your immunizations.  Do not use any tobacco products including cigarettes, chewing tobacco, or electronic cigarettes.  If you are pregnant, do not drink alcohol.  If you are breastfeeding, limit how much and how often you drink alcohol.  Limit alcohol intake to no more than 1 drink per day for nonpregnant women. One drink equals 12 ounces of beer, 5 ounces of wine, or 1 ounces of hard liquor.  Do not use street drugs.  Do not share needles.  Ask your health care provider for help if you need support or information about quitting drugs.  Tell your health care provider if you often feel depressed.  Tell your health care provider if you have ever been abused or do not feel safe at home. This information is not intended to replace advice given to you by your health care provider. Make sure you discuss any questions you have with your health care provider. Document Released: 07/31/2010 Document Revised: 06/23/2015 Document Reviewed: 10/19/2014 Elsevier Interactive Patient Education  [employer].

## 2016-12-06 ENCOUNTER — Other Ambulatory Visit (INDEPENDENT_AMBULATORY_CARE_PROVIDER_SITE_OTHER): Payer: PPO

## 2016-12-06 ENCOUNTER — Encounter: Payer: Self-pay | Admitting: Internal Medicine

## 2016-12-06 ENCOUNTER — Ambulatory Visit (INDEPENDENT_AMBULATORY_CARE_PROVIDER_SITE_OTHER): Payer: PPO | Admitting: Internal Medicine

## 2016-12-06 VITALS — BP 132/76 | HR 58 | Temp 98.3°F | Resp 16 | Ht 65.0 in | Wt 138.0 lb

## 2016-12-06 DIAGNOSIS — Z0000 Encounter for general adult medical examination without abnormal findings: Secondary | ICD-10-CM

## 2016-12-06 DIAGNOSIS — Z853 Personal history of malignant neoplasm of breast: Secondary | ICD-10-CM | POA: Diagnosis not present

## 2016-12-06 DIAGNOSIS — Z23 Encounter for immunization: Secondary | ICD-10-CM

## 2016-12-06 DIAGNOSIS — M810 Age-related osteoporosis without current pathological fracture: Secondary | ICD-10-CM

## 2016-12-06 LAB — CBC WITH DIFFERENTIAL/PLATELET
BASOS ABS: 0 10*3/uL (ref 0.0–0.1)
BASOS PCT: 0.5 % (ref 0.0–3.0)
EOS PCT: 1.7 % (ref 0.0–5.0)
Eosinophils Absolute: 0.1 10*3/uL (ref 0.0–0.7)
HEMATOCRIT: 40.3 % (ref 36.0–46.0)
Hemoglobin: 13.5 g/dL (ref 12.0–15.0)
LYMPHS ABS: 1.4 10*3/uL (ref 0.7–4.0)
LYMPHS PCT: 28.7 % (ref 12.0–46.0)
MCHC: 33.6 g/dL (ref 30.0–36.0)
MCV: 100.8 fl — AB (ref 78.0–100.0)
Monocytes Absolute: 0.3 10*3/uL (ref 0.1–1.0)
Monocytes Relative: 6.9 % (ref 3.0–12.0)
NEUTROS ABS: 3 10*3/uL (ref 1.4–7.7)
NEUTROS PCT: 62.2 % (ref 43.0–77.0)
PLATELETS: 193 10*3/uL (ref 150.0–400.0)
RBC: 4 Mil/uL (ref 3.87–5.11)
RDW: 13.6 % (ref 11.5–15.5)
WBC: 4.8 10*3/uL (ref 4.0–10.5)

## 2016-12-06 LAB — LIPID PANEL
Cholesterol: 195 mg/dL (ref 0–200)
HDL: 76.8 mg/dL (ref >39.00)
LDL CALC: 103 mg/dL — AB (ref 0–99)
NONHDL: 118.51
TRIGLYCERIDES: 76 mg/dL (ref 0.0–149.0)
Total CHOL/HDL Ratio: 3
VLDL: 15.2 mg/dL (ref 0.0–40.0)

## 2016-12-06 LAB — COMPREHENSIVE METABOLIC PANEL
ALT: 13 U/L (ref 0–35)
AST: 16 U/L (ref 0–37)
Albumin: 4.4 g/dL (ref 3.5–5.2)
Alkaline Phosphatase: 57 U/L (ref 39–117)
BUN: 14 mg/dL (ref 6–23)
CALCIUM: 9.9 mg/dL (ref 8.4–10.5)
CHLORIDE: 103 meq/L (ref 96–112)
CO2: 30 mEq/L (ref 19–32)
Creatinine, Ser: 0.71 mg/dL (ref 0.40–1.20)
GFR: 87.35 mL/min (ref >60.00)
GLUCOSE: 94 mg/dL (ref 70–99)
POTASSIUM: 4 meq/L (ref 3.5–5.1)
Sodium: 139 mEq/L (ref 135–145)
Total Bilirubin: 0.7 mg/dL (ref 0.2–1.2)
Total Protein: 6.7 g/dL (ref 6.0–8.3)

## 2016-12-06 LAB — TSH: TSH: 1.78 u[IU]/mL (ref 0.35–4.50)

## 2016-12-06 NOTE — Assessment & Plan Note (Signed)
She did retire this year and has been walking regularly since then-she is hoping this will improve her bone density She is taking vitamin D and calcium regularly Hopes to avoid medication Repeat DEXA next year

## 2016-12-06 NOTE — Assessment & Plan Note (Signed)
Following with oncology No evidence of recurrence

## 2017-05-09 DIAGNOSIS — Z6823 Body mass index (BMI) 23.0-23.9, adult: Secondary | ICD-10-CM | POA: Diagnosis not present

## 2017-05-09 DIAGNOSIS — N3281 Overactive bladder: Secondary | ICD-10-CM | POA: Diagnosis not present

## 2017-05-09 DIAGNOSIS — Z01419 Encounter for gynecological examination (general) (routine) without abnormal findings: Secondary | ICD-10-CM | POA: Diagnosis not present

## 2017-05-09 DIAGNOSIS — Z130 Encounter for screening for diseases of the blood and blood-forming organs and certain disorders involving the immune mechanism: Secondary | ICD-10-CM | POA: Diagnosis not present

## 2017-05-09 DIAGNOSIS — Z1389 Encounter for screening for other disorder: Secondary | ICD-10-CM | POA: Diagnosis not present

## 2017-05-10 ENCOUNTER — Other Ambulatory Visit: Payer: Self-pay | Admitting: Adult Health

## 2017-05-10 DIAGNOSIS — Z9889 Other specified postprocedural states: Secondary | ICD-10-CM

## 2017-05-21 ENCOUNTER — Telehealth: Payer: Self-pay | Admitting: Emergency Medicine

## 2017-05-21 NOTE — Telephone Encounter (Signed)
Called patient back to schedule AWV. Patient declined at this time.

## 2017-07-09 ENCOUNTER — Ambulatory Visit
Admission: RE | Admit: 2017-07-09 | Discharge: 2017-07-09 | Disposition: A | Discharge status: Home / Self Care | Payer: PPO | Source: Ambulatory Visit | Attending: Adult Health | Admitting: Adult Health

## 2017-07-09 DIAGNOSIS — Z9889 Other specified postprocedural states: Secondary | ICD-10-CM

## 2017-07-09 DIAGNOSIS — R922 Inconclusive mammogram: Secondary | ICD-10-CM | POA: Diagnosis not present

## 2017-07-09 DIAGNOSIS — Z510 Encounter for antineoplastic radiation therapy: Secondary | ICD-10-CM | POA: Diagnosis not present

## 2017-11-15 ENCOUNTER — Inpatient Hospital Stay: Payer: PPO | Attending: Adult Health | Admitting: Adult Health

## 2017-11-15 ENCOUNTER — Encounter: Payer: Self-pay | Admitting: Adult Health

## 2017-11-15 ENCOUNTER — Telehealth: Payer: Self-pay | Admitting: Adult Health

## 2017-11-15 VITALS — BP 130/73 | HR 60 | Temp 98.4°F | Resp 18 | Ht 65.0 in | Wt 139.2 lb

## 2017-11-15 DIAGNOSIS — M810 Age-related osteoporosis without current pathological fracture: Secondary | ICD-10-CM | POA: Insufficient documentation

## 2017-11-15 DIAGNOSIS — Z170 Estrogen receptor positive status [ER+]: Secondary | ICD-10-CM

## 2017-11-15 DIAGNOSIS — Z853 Personal history of malignant neoplasm of breast: Secondary | ICD-10-CM | POA: Diagnosis not present

## 2017-11-15 DIAGNOSIS — Z923 Personal history of irradiation: Secondary | ICD-10-CM | POA: Insufficient documentation

## 2017-11-15 DIAGNOSIS — C50412 Malignant neoplasm of upper-outer quadrant of left female breast: Secondary | ICD-10-CM

## 2017-11-15 NOTE — Progress Notes (Signed)
CLINIC:  Survivorship   REASON FOR VISIT:  Routine follow-up for history of breast cancer.   BRIEF ONCOLOGIC HISTORY:    Breast cancer of upper-outer quadrant of left female breast ([place])   06/11/2013 Initial Biopsy    Left breast core needle bx: DCIS, high grade, with comedonecrosis, ER- (0%), PR- (0%)    07/13/2013 Definitive Surgery    Left lumpectomy/SLNB: DCIS, high grade, comedonecrosis. 0/3 LN    07/13/2013 Pathologic Stage    Stage 0: Tis N0    09/08/2013 - 10/07/2013 Radiation Therapy    Adjuvant RT: Left breast: 40.05 Gy over 15 fractions; left breast bost: 10 Gy over 5 fractions; total dose 50.05 Gy      INTERVAL HISTORY:  Sherry Wells presents to the [facility] today for routine follow-up for her history of breast cancer.  Overall, she reports feeling quite well. She sees Dr. Quay Burow, her pcp next month.  She is walking regularly.  She has for the most part finished with her cervical cancer screening.  She sees Dr. Willis Modena regularly.  She is up to date with colon cancer screening.  She notes she needs to and plans to make appt with Dr. Nevada Crane, her dermatologist for skin cancer screening.  REVIEW OF SYSTEMS:  Review of Systems  Constitutional: Negative for appetite change, chills, fatigue, fever and unexpected weight change.  HENT:   Negative for hearing loss and lump/mass.   Eyes: Negative for eye problems and icterus.  Respiratory: Negative for chest tightness, cough, shortness of breath and wheezing.   Cardiovascular: Negative for chest pain, leg swelling and palpitations.  Gastrointestinal: Negative for abdominal distention, abdominal pain, blood in stool, constipation, diarrhea, nausea and vomiting.  Endocrine: Negative for hot flashes.  Genitourinary: Negative for difficulty urinating.   Skin: Negative for itching and rash.  Neurological: Negative for dizziness, extremity weakness, headaches and numbness.  Hematological: Negative for adenopathy. Does not  bruise/bleed easily.  Psychiatric/Behavioral: Negative for depression. The patient is not nervous/anxious.   Breast: Denies any new nodularity, masses, tenderness, nipple changes, or nipple discharge.       PAST MEDICAL/SURGICAL HISTORY:  Past Medical History:  Diagnosis Date  . Breast cancer ([place]) 06/2013   left UOQ  . Fibrocystic breast disease   . Hyperlipidemia   . Osteoporosis   . Personal history of radiation therapy   . PONV (postoperative nausea and vomiting)   . S/P radiation therapy 09/08/2013-10/07/2013    1) Left breast / 40.05Gy in 15 fractions/ 2) Left Breast Boost / 10 Gy in 5 fractions   Past Surgical History:  Procedure Laterality Date  . BREAST BIOPSY Left 2015  . BREAST CYST ASPIRATION Left 1980  . BREAST EXCISIONAL BIOPSY Left 1985  . BREAST LUMPECTOMY  1980   cyst  . BREAST LUMPECTOMY Left 07/13/2013  . COLONOSCOPY  2002 & 2013   negative; [facility]  . DILATION AND CURETTAGE OF UTERUS     x2  . TUBAL LIGATION  1994     ALLERGIES:  No Known Allergies   CURRENT MEDICATIONS:  Outpatient Encounter Medications as of 11/15/2017  Medication Sig Note  . Calcium Carbonate-Vitamin D (CALTRATE 600+D) 600-400 MG-UNIT per tablet Take 1 tablet by mouth 2 (two) times daily.   . cholecalciferol (VITAMIN D) 400 UNITS TABS Take 400 Units by mouth daily.   Marland Kitchen doxycycline (ORACEA) 40 MG capsule Take 40 mg by mouth every morning. As needed for rosacea 10/30/2013: Dr Dayton Martes, Derm Taken prn  .  Multiple Vitamins-Minerals (MULTIVITAMIN WITH MINERALS) tablet Take 1 tablet by mouth daily.   Marland Kitchen oxybutynin (DITROPAN-XL) 10 MG 24 hr tablet Take 10 mg by mouth daily. 10/17/2011: Dr Sherren Mocha Meisinger, Gyn  . vitamin C (ASCORBIC ACID) 500 MG tablet Take 500 mg by mouth daily.    No facility-administered encounter medications on file as of 11/15/2017.      ONCOLOGIC FAMILY HISTORY:  Family History  Problem Relation Age of Onset  . Diabetes Mother   . Transient ischemic attack  Mother        in 49s  . Hypertension Mother   . Valvular heart disease Father        Aortic valve  . Osteoporosis Sister   . Cardiomyopathy Sister   . Throat cancer Maternal Uncle   . Cancer Maternal Uncle        ? primary  . Valvular heart disease Paternal Uncle        Aortic valve    GENETIC COUNSELING/TESTING: Not at this time  SOCIAL HISTORY:  Sherry Wells is married and lives with her husband in [place], [place].  She has 1 stepdaughter and grandson and they live in [place], [place].  Sherry Wells is currently retired.  She denies any current or history of tobacco, alcohol, or illicit drug use.  (reviewed in 10/2017)   PHYSICAL EXAMINATION:  Vital Signs: Vitals:   11/15/17 0955  BP: 130/73  Pulse: 60  Resp: 18  Temp: 98.4 F (36.9 C)  SpO2: 100%   Filed Weights   11/15/17 0955  Weight: 139 lb 3.2 oz (63.1 kg)   General: Well-nourished, well-appearing female in no acute distress.  Unaccompanied today.   HEENT: Head is normocephalic.  Pupils equal and reactive to light. Conjunctivae clear without exudate.  Sclerae anicteric. Oral mucosa is pink, moist.  Oropharynx is pink without lesions or erythema.  Lymph: No cervical, supraclavicular, or infraclavicular lymphadenopathy noted on palpation.  Cardiovascular: Regular rate and rhythm.Marland Kitchen Respiratory: Clear to auscultation bilaterally. Chest expansion symmetric; breathing non-labored.  Breast Exam:  -Left breast: No appreciable masses on palpation. No skin redness, thickening, or peau d'orange appearance; no nipple retraction or nipple discharge; mild distortion in symmetry at previous lumpectomy site well healed scar without erythema or nodularity.  -Right breast: No appreciable masses on palpation. No skin redness, thickening, or peau d'orange appearance; no nipple retraction or nipple discharge;  -Axilla: No axillary adenopathy bilaterally.  GI: Abdomen soft and round; non-tender, non-distended. Bowel sounds  normoactive. No hepatosplenomegaly.   GU: Deferred.  Neuro: No focal deficits. Steady gait.  Psych: Mood and affect normal and appropriate for situation.  MSK: No focal spinal tenderness to palpation, full range of motion in bilateral upper extremities Extremities: No edema. Skin: Warm and dry.  LABORATORY DATA:  None for this visit   DIAGNOSTIC IMAGING:  Most recent mammogram:      ASSESSMENT AND PLAN:  Ms.. Villescas is a pleasant 67 y.o. female with history of Stage 0 left breast DCIS, ER-/PR-, diagnosed in 05/2013, treated with lumpectomy and adjuvant radiation therapy.  She presents to the [facility] for surveillance and routine follow-up.   1. History of breast cancer:  Ms. Mauriello is currently clinically and radiographically without evidence of disease or recurrence of breast cancer. She will be due for mammogram in 06/2018; orders placed today.  She will return in one year for continued surveillance and monitoring in the long term survivorship clinic. At that point, she will graduate from follow up.  I encouraged her to call me with any questions or concerns before her next visit at the cancer center, and I would be happy to see her sooner, if needed.    2. Bone health:  Given Ms. Hurless's age, history of breast cancer, she is at risk for bone demineralization. Her last DEXA scan was on 01/09/2016 and was consistent with osteoporosis with a T score of -2.6 in her right femoral neck.  She is due for repeat in December of this year.   This is followed by her PCP, Dr. Quay Burow.  She was given education on specific food and activities to promote bone health.  3. Cancer screening:  Due to Ms. Kornegay's history and her age, she should receive screening for skin cancers, colon cancer, and gynecologic cancers. She was encouraged to follow-up with her PCP for appropriate cancer screenings.   4. Health maintenance and wellness promotion: Ms. Oncale was encouraged to consume 5-7 servings  of fruits and vegetables per day. She was also encouraged to engage in moderate to vigorous exercise for 30 minutes per day most days of the week. She was instructed to limit her alcohol consumption and continue to abstain from tobacco use.    Dispo:  -Return to cancer center in one year for LTS follow up -Mammogram in 06/2018   A total of (30) minutes of face-to-face time was spent with this patient with greater than 50% of that time in counseling and care-coordination.   Gardenia Phlegm, NP Survivorship Program [facility] [phone]   Note: PRIMARY CARE PROVIDER Binnie Rail, [place] [phone]

## 2017-11-15 NOTE — Patient Instructions (Signed)
Bone Health Bones protect organs, store calcium, and anchor muscles. Good health habits, such as eating nutritious foods and exercising regularly, are important for maintaining healthy bones. They can also help to prevent a condition that causes bones to lose density and become weak and brittle (osteoporosis). Why is bone mass important? Bone mass refers to the amount of bone tissue that you have. The higher your bone mass, the stronger your bones. An important step toward having healthy bones throughout life is to have strong and dense bones during childhood. A young adult who has a high bone mass is more likely to have a high bone mass later in life. Bone mass at its greatest it is called peak bone mass. A large decline in bone mass occurs in older adults. In women, it occurs about the time of menopause. During this time, it is important to practice good health habits, because if more bone is lost than what is replaced, the bones will become less healthy and more likely to break (fracture). If you find that you have a low bone mass, you may be able to prevent osteoporosis or further bone loss by changing your diet and lifestyle. How can I find out if my bone mass is low? Bone mass can be measured with an X-ray test that is called a bone mineral density (BMD) test. This test is recommended for all women who are age 65 or older. It may also be recommended for men who are age 70 or older, or for people who are more likely to develop osteoporosis due to:  Having bones that break easily.  Having a long-term disease that weakens bones, such as kidney disease or rheumatoid arthritis.  Having menopause earlier than normal.  Taking medicine that weakens bones, such as steroids, thyroid hormones, or hormone treatment for breast cancer or prostate cancer.  Smoking.  Drinking three or more alcoholic drinks each day.  What are the nutritional recommendations for healthy bones? To have healthy bones, you  need to get enough of the right minerals and vitamins. Most nutrition experts recommend getting these nutrients from the foods that you eat. Nutritional recommendations vary from person to person. Ask your health care provider what is healthy for you. Here are some general guidelines. Calcium Recommendations Calcium is the most important (essential) mineral for bone health. Most people can get enough calcium from their diet, but supplements may be recommended for people who are at risk for osteoporosis. Good sources of calcium include:  Dairy products, such as low-fat or nonfat milk, cheese, and yogurt.  Dark green leafy vegetables, such as bok choy and broccoli.  Calcium-fortified foods, such as orange juice, cereal, bread, soy beverages, and tofu products.  Nuts, such as almonds.  Follow these recommended amounts for daily calcium intake:  Children, age 1?3: 700 mg.  Children, age 4?8: 1,000 mg.  Children, age 9?13: 1,300 mg.  Teens, age 14?18: 1,300 mg.  Adults, age 19?50: 1,000 mg.  Adults, age 51?70: ? Men: 1,000 mg. ? Women: 1,200 mg.  Adults, age 71 or older: 1,200 mg.  Pregnant and breastfeeding females: ? Teens: 1,300 mg. ? Adults: 1,000 mg.  Vitamin D Recommendations Vitamin D is the most essential vitamin for bone health. It helps the body to absorb calcium. Sunlight stimulates the skin to make vitamin D, so be sure to get enough sunlight. If you live in a cold climate or you do not get outside often, your health care provider may recommend that you take vitamin   D supplements. Good sources of vitamin D in your diet include:  Egg yolks.  Saltwater fish.  Milk and cereal fortified with vitamin D.  Follow these recommended amounts for daily vitamin D intake:  Children and teens, age 1?18: 600 international units.  Adults, age 50 or younger: 400-800 international units.  Adults, age 51 or older: 800-1,000 international units.  Other Nutrients Other nutrients  for bone health include:  Phosphorus. This mineral is found in meat, poultry, dairy foods, nuts, and legumes. The recommended daily intake for adult men and adult women is 700 mg.  Magnesium. This mineral is found in seeds, nuts, dark green vegetables, and legumes. The recommended daily intake for adult men is 400?420 mg. For adult women, it is 310?320 mg.  Vitamin K. This vitamin is found in green leafy vegetables. The recommended daily intake is 120 mg for adult men and 90 mg for adult women.  What type of physical activity is best for building and maintaining healthy bones? Weight-bearing and strength-building activities are important for building and maintaining peak bone mass. Weight-bearing activities cause muscles and bones to work against gravity. Strength-building activities increases muscle strength that supports bones. Weight-bearing and muscle-building activities include:  Walking and hiking.  Jogging and running.  Dancing.  Gym exercises.  Lifting weights.  Tennis and racquetball.  Climbing stairs.  Aerobics.  Adults should get at least 30 minutes of moderate physical activity on most days. Children should get at least 60 minutes of moderate physical activity on most days. Ask your health care provide what type of exercise is best for you. Where can I find more information? For more information, check out the following websites:  National Osteoporosis Foundation: http://nof.org/learn/basics  National Institutes of Health: http://www.niams.nih.gov/Health_Info/Bone/Bone_Health/bone_health_for_life.asp  This information is not intended to replace advice given to you by your health care provider. Make sure you discuss any questions you have with your health care provider. Document Released: 04/07/2003 Document Revised: 08/05/2015 Document Reviewed: 01/20/2014 Elsevier Interactive Patient Education  2018 [employer]  

## 2017-11-15 NOTE — Telephone Encounter (Signed)
Gave pt avs and calendar  °

## 2017-12-16 NOTE — Patient Instructions (Addendum)
Tests ordered today. Your results will be released to [facility] (or called to you) after review, usually within 72hours after test completion. If any changes need to be made, you will be notified at that same time.   Medications reviewed and updated.  Changes include :   none   Please followup in one year   Health Maintenance, Female Adopting a healthy lifestyle and getting preventive care can go a long way to promote health and wellness. Talk with your health care provider about what schedule of regular examinations is right for you. This is a good chance for you to check in with your provider about disease prevention and staying healthy. In between checkups, there are plenty of things you can do on your own. Experts have done a lot of research about which lifestyle changes and preventive measures are most likely to keep you healthy. Ask your health care provider for more information. Weight and diet Eat a healthy diet  Be sure to include plenty of vegetables, fruits, low-fat dairy products, and lean protein.  Do not eat a lot of foods high in solid fats, added sugars, or salt.  Get regular exercise. This is one of the most important things you can do for your health. ? Most adults should exercise for at least 150 minutes each week. The exercise should increase your heart rate and make you sweat (moderate-intensity exercise). ? Most adults should also do strengthening exercises at least twice a week. This is in addition to the moderate-intensity exercise.  Maintain a healthy weight  Body mass index (BMI) is a measurement that can be used to identify possible weight problems. It estimates body fat based on height and weight. Your health care provider can help determine your BMI and help you achieve or maintain a healthy weight.  For females 29 years of age and older: ? A BMI below 18.5 is considered underweight. ? A BMI of 18.5 to 24.9 is normal. ? A BMI of 25 to 29.9 is considered  overweight. ? A BMI of 30 and above is considered obese.  Watch levels of cholesterol and blood lipids  You should start having your blood tested for lipids and cholesterol at 67 years of age, then have this test every 5 years.  You may need to have your cholesterol levels checked more often if: ? Your lipid or cholesterol levels are high. ? You are older than 67 years of age. ? You are at high risk for heart disease.  Cancer screening Lung Cancer  Lung cancer screening is recommended for adults 57-22 years old who are at high risk for lung cancer because of a history of smoking.  A yearly low-dose CT scan of the lungs is recommended for people who: ? Currently smoke. ? Have quit within the past 15 years. ? Have at least a 30-pack-year history of smoking. A pack year is smoking an average of one pack of cigarettes a day for 1 year.  Yearly screening should continue until it has been 15 years since you quit.  Yearly screening should stop if you develop a health problem that would prevent you from having lung cancer treatment.  Breast Cancer  Practice breast self-awareness. This means understanding how your breasts normally appear and feel.  It also means doing regular breast self-exams. Let your health care provider know about any changes, no matter how small.  If you are in your 20s or 30s, you should have a clinical breast exam (CBE) by a health  care provider every 1-3 years as part of a regular health exam.  If you are 88 or older, have a CBE every year. Also consider having a breast X-ray (mammogram) every year.  If you have a family history of breast cancer, talk to your health care provider about genetic screening.  If you are at high risk for breast cancer, talk to your health care provider about having an MRI and a mammogram every year.  Breast cancer gene (BRCA) assessment is recommended for women who have family members with BRCA-related cancers. BRCA-related cancers  include: ? Breast. ? Ovarian. ? Tubal. ? Peritoneal cancers.  Results of the assessment will determine the need for genetic counseling and BRCA1 and BRCA2 testing.  Cervical Cancer Your health care provider may recommend that you be screened regularly for cancer of the pelvic organs (ovaries, uterus, and vagina). This screening involves a pelvic examination, including checking for microscopic changes to the surface of your cervix (Pap test). You may be encouraged to have this screening done every 3 years, beginning at age 67.  For women ages 15-65, health care providers may recommend pelvic exams and Pap testing every 3 years, or they may recommend the Pap and pelvic exam, combined with testing for human papilloma virus (HPV), every 5 years. Some types of HPV increase your risk of cervical cancer. Testing for HPV may also be done on women of any age with unclear Pap test results.  Other health care providers may not recommend any screening for nonpregnant women who are considered low risk for pelvic cancer and who do not have symptoms. Ask your health care provider if a screening pelvic exam is right for you.  If you have had past treatment for cervical cancer or a condition that could lead to cancer, you need Pap tests and screening for cancer for at least 20 years after your treatment. If Pap tests have been discontinued, your risk factors (such as having a new sexual partner) need to be reassessed to determine if screening should resume. Some women have medical problems that increase the chance of getting cervical cancer. In these cases, your health care provider may recommend more frequent screening and Pap tests.  Colorectal Cancer  This type of cancer can be detected and often prevented.  Routine colorectal cancer screening usually begins at 67 years of age and continues through 67 years of age.  Your health care provider may recommend screening at an earlier age if you have risk factors  for colon cancer.  Your health care provider may also recommend using home test kits to check for hidden blood in the stool.  A small camera at the end of a tube can be used to examine your colon directly (sigmoidoscopy or colonoscopy). This is done to check for the earliest forms of colorectal cancer.  Routine screening usually begins at age 21.  Direct examination of the colon should be repeated every 5-10 years through 67 years of age. However, you may need to be screened more often if early forms of precancerous polyps or small growths are found.  Skin Cancer  Check your skin from head to toe regularly.  Tell your health care provider about any new moles or changes in moles, especially if there is a change in a mole's shape or color.  Also tell your health care provider if you have a mole that is larger than the size of a pencil eraser.  Always use sunscreen. Apply sunscreen liberally and repeatedly throughout the  day.  Protect yourself by wearing long sleeves, pants, a wide-brimmed hat, and sunglasses whenever you are outside.  Heart disease, diabetes, and high blood pressure  High blood pressure causes heart disease and increases the risk of stroke. High blood pressure is more likely to develop in: ? People who have blood pressure in the high end of the normal range (130-139/85-89 mm Hg). ? People who are overweight or obese. ? People who are African American.  If you are 33-25 years of age, have your blood pressure checked every 3-5 years. If you are 54 years of age or older, have your blood pressure checked every year. You should have your blood pressure measured twice-once when you are at a hospital or clinic, and once when you are not at a hospital or clinic. Record the average of the two measurements. To check your blood pressure when you are not at a hospital or clinic, you can use: ? An automated blood pressure machine at a pharmacy. ? A home blood pressure monitor.  If  you are between 59 years and 73 years old, ask your health care provider if you should take aspirin to prevent strokes.  Have regular diabetes screenings. This involves taking a blood sample to check your fasting blood sugar level. ? If you are at a normal weight and have a low risk for diabetes, have this test once every three years after 67 years of age. ? If you are overweight and have a high risk for diabetes, consider being tested at a younger age or more often. Preventing infection Hepatitis B  If you have a higher risk for hepatitis B, you should be screened for this virus. You are considered at high risk for hepatitis B if: ? You were born in a country where hepatitis B is common. Ask your health care provider which countries are considered high risk. ? Your parents were born in a high-risk country, and you have not been immunized against hepatitis B (hepatitis B vaccine). ? You have HIV or AIDS. ? You use needles to inject street drugs. ? You live with someone who has hepatitis B. ? You have had sex with someone who has hepatitis B. ? You get hemodialysis treatment. ? You take certain medicines for conditions, including cancer, organ transplantation, and autoimmune conditions.  Hepatitis C  Blood testing is recommended for: ? Everyone born from [birth year] through 1965. ? Anyone with known risk factors for hepatitis C.  Sexually transmitted infections (STIs)  You should be screened for sexually transmitted infections (STIs) including gonorrhea and chlamydia if: ? You are sexually active and are younger than 67 years of age. ? You are older than 67 years of age and your health care provider tells you that you are at risk for this type of infection. ? Your sexual activity has changed since you were last screened and you are at an increased risk for chlamydia or gonorrhea. Ask your health care provider if you are at risk.  If you do not have HIV, but are at risk, it may be recommended  that you take a prescription medicine daily to prevent HIV infection. This is called pre-exposure prophylaxis (PrEP). You are considered at risk if: ? You are sexually active and do not regularly use condoms or know the HIV status of your partner(s). ? You take drugs by injection. ? You are sexually active with a partner who has HIV.  Talk with your health care provider about whether you are at high  risk of being infected with HIV. If you choose to begin PrEP, you should first be tested for HIV. You should then be tested every 3 months for as long as you are taking PrEP. Pregnancy  If you are premenopausal and you may become pregnant, ask your health care provider about preconception counseling.  If you may become pregnant, take 400 to 800 micrograms (mcg) of folic acid every day.  If you want to prevent pregnancy, talk to your health care provider about birth control (contraception). Osteoporosis and menopause  Osteoporosis is a disease in which the bones lose minerals and strength with aging. This can result in serious bone fractures. Your risk for osteoporosis can be identified using a bone density scan.  If you are 70 years of age or older, or if you are at risk for osteoporosis and fractures, ask your health care provider if you should be screened.  Ask your health care provider whether you should take a calcium or vitamin D supplement to lower your risk for osteoporosis.  Menopause may have certain physical symptoms and risks.  Hormone replacement therapy may reduce some of these symptoms and risks. Talk to your health care provider about whether hormone replacement therapy is right for you. Follow these instructions at home:  Schedule regular health, dental, and eye exams.  Stay current with your immunizations.  Do not use any tobacco products including cigarettes, chewing tobacco, or electronic cigarettes.  If you are pregnant, do not drink alcohol.  If you are  breastfeeding, limit how much and how often you drink alcohol.  Limit alcohol intake to no more than 1 drink per day for nonpregnant women. One drink equals 12 ounces of beer, 5 ounces of wine, or 1 ounces of hard liquor.  Do not use street drugs.  Do not share needles.  Ask your health care provider for help if you need support or information about quitting drugs.  Tell your health care provider if you often feel depressed.  Tell your health care provider if you have ever been abused or do not feel safe at home. This information is not intended to replace advice given to you by your health care provider. Make sure you discuss any questions you have with your health care provider. Document Released: 07/31/2010 Document Revised: 06/23/2015 Document Reviewed: 10/19/2014 Elsevier Interactive Patient Education  [employer].

## 2017-12-16 NOTE — Progress Notes (Signed)
Subjective:    Patient ID: Sherry Wells, female    DOB: 23-May-1950, 67 y.o.   MRN: [id number]  HPI She is here for a physical exam.   She denies any changes in her history since she was here last.  She has no concerns.  Overall she states she is feeling well.  Medications and allergies reviewed with patient and updated if appropriate.  Patient Active Problem List   Diagnosis Date Noted  . History of breast cancer 12/05/2015  . Breast cancer of upper-outer quadrant of left female breast ([place]) 05/16/2015  . Breast cancer in situ 06/13/2013  . Elevated HDL 09/22/2008  . Osteoporosis 09/22/2008    Current Outpatient Medications on File Prior to Visit  Medication Sig Dispense Refill  . Calcium Carbonate-Vitamin D (CALTRATE 600+D) 600-400 MG-UNIT per tablet Take 1 tablet by mouth 2 (two) times daily.    . cholecalciferol (VITAMIN D) 400 UNITS TABS Take 400 Units by mouth daily.    Marland Kitchen doxycycline (ORACEA) 40 MG capsule Take 40 mg by mouth every morning. As needed for rosacea    . Multiple Vitamins-Minerals (MULTIVITAMIN WITH MINERALS) tablet Take 1 tablet by mouth daily.    Marland Kitchen oxybutynin (DITROPAN-XL) 10 MG 24 hr tablet Take 10 mg by mouth daily.    . vitamin C (ASCORBIC ACID) 500 MG tablet Take 500 mg by mouth daily.     No current facility-administered medications on file prior to visit.     Past Medical History:  Diagnosis Date  . Breast cancer ([place]) 06/2013   left UOQ  . Fibrocystic breast disease   . Hyperlipidemia   . Osteoporosis   . Personal history of radiation therapy   . PONV (postoperative nausea and vomiting)   . S/P radiation therapy 09/08/2013-10/07/2013    1) Left breast / 40.05Gy in 15 fractions/ 2) Left Breast Boost / 10 Gy in 5 fractions    Past Surgical History:  Procedure Laterality Date  . BREAST BIOPSY Left 2015  . BREAST CYST ASPIRATION Left 1980  . BREAST EXCISIONAL BIOPSY Left 1985  . BREAST LUMPECTOMY  1980   cyst  . BREAST LUMPECTOMY Left  07/13/2013  . COLONOSCOPY  2002 & 2013   negative; [facility]  . DILATION AND CURETTAGE OF UTERUS     x2  . TUBAL LIGATION  1994    Social History   Socioeconomic History  . Marital status: Married    Spouse name: Not on file  . Number of children: Not on file  . Years of education: Not on file  . Highest education level: Not on file  Occupational History  . Occupation: retired  Scientific laboratory technician  . Financial resource strain: Not hard at all  . Food insecurity:    Worry: Never true    Inability: Never true  . Transportation needs:    Medical: No    Non-medical: No  Tobacco Use  . Smoking status: Never Smoker  . Smokeless tobacco: Never Used  Substance and Sexual Activity  . Alcohol use: No  . Drug use: No  . Sexual activity: Yes    Comment: meanarche 44, G0, menopause age 18, no HRT  Lifestyle  . Physical activity:    Days per week: 5 days    Minutes per session: 60 min  . Stress: Not at all  Relationships  . Social connections:    Talks on phone: More than three times a week    Gets together: More than three  times a week    Attends religious service: More than 4 times per year    Active member of club or organization: Yes    Attends meetings of clubs or organizations: More than 4 times per year    Relationship status: Married  Other Topics Concern  . Not on file  Social History Narrative  . Not on file    Family History  Problem Relation Age of Onset  . Diabetes Mother   . Transient ischemic attack Mother        in 73s  . Hypertension Mother   . Valvular heart disease Father        Aortic valve  . Osteoporosis Sister   . Cardiomyopathy Sister   . Throat cancer Maternal Uncle   . Cancer Maternal Uncle        ? primary  . Valvular heart disease Paternal Uncle        Aortic valve    Review of Systems  Constitutional: Negative for chills, fatigue and fever.  Eyes: Negative for visual disturbance.  Respiratory: Negative for cough, shortness of breath  and wheezing.   Cardiovascular: Negative for chest pain, palpitations and leg swelling.  Gastrointestinal: Negative for abdominal pain, blood in stool, constipation, diarrhea and nausea.       No gerd  Genitourinary: Negative for dysuria and hematuria.  Musculoskeletal: Positive for arthralgias (mild). Negative for back pain.  Skin: Negative for color change and rash.  Neurological: Negative for dizziness, light-headedness, numbness and headaches.  Psychiatric/Behavioral: Negative for dysphoric mood and sleep disturbance. The patient is not nervous/anxious.        Objective:   Vitals:   12/17/17 1501  BP: 117/62  Pulse: (!) 55  Resp: 17  Temp: 98.2 F (36.8 C)  SpO2: 99%   Filed Weights   12/17/17 1501  Weight: 142 lb (64.4 kg)   Body mass index is 23.63 kg/m.  BP Readings from Last 3 Encounters:  12/17/17 117/62  12/17/17 117/62  11/15/17 130/73    Wt Readings from Last 3 Encounters:  12/17/17 142 lb (64.4 kg)  12/17/17 142 lb (64.4 kg)  11/15/17 139 lb 3.2 oz (63.1 kg)     Physical Exam Constitutional: She appears well-developed and well-nourished. No distress.  HENT:  Head: Normocephalic and atraumatic.  Right Ear: External ear normal. Normal ear canal and TM Left Ear: External ear normal.  Normal ear canal and TM Mouth/Throat: Oropharynx is clear and moist.  Eyes: Conjunctivae and EOM are normal.  Neck: Neck supple. No tracheal deviation present. No thyromegaly present.  No carotid bruit  Cardiovascular: Normal rate, regular rhythm and normal heart sounds.   No murmur heard.  No edema. Pulmonary/Chest: Effort normal and breath sounds normal. No respiratory distress. She has no wheezes. She has no rales.  Breast: deferred to Gyn Abdominal: Soft. She exhibits no distension. There is no tenderness.  Lymphadenopathy: She has no cervical adenopathy.  Skin: Skin is warm and dry. She is not diaphoretic.  Psychiatric: She has a normal mood and affect. Her  behavior is normal.        Assessment & Plan:   Physical exam: Screening blood work  ordered Immunizations    Flu vaccine today, pneumovax due, discussed shingrix Colonoscopy   Up to date  Mammogram    Up to date  Gyn  Up to date  Dexa    Up to date-due soon-ordered Eye exams  Up to date  EKG   Done 11/2016 Exercise  Walking regularly - 30 min most days Weight   Normal BMI Skin no concerns Substance abuse   none  See Problem List for Assessment and Plan of chronic medical problems.    Follow-up in 1 year

## 2017-12-17 ENCOUNTER — Encounter: Payer: Self-pay | Admitting: Internal Medicine

## 2017-12-17 ENCOUNTER — Ambulatory Visit (INDEPENDENT_AMBULATORY_CARE_PROVIDER_SITE_OTHER): Payer: PPO | Admitting: *Deleted

## 2017-12-17 ENCOUNTER — Ambulatory Visit (INDEPENDENT_AMBULATORY_CARE_PROVIDER_SITE_OTHER): Payer: PPO | Admitting: Internal Medicine

## 2017-12-17 ENCOUNTER — Other Ambulatory Visit (INDEPENDENT_AMBULATORY_CARE_PROVIDER_SITE_OTHER): Payer: PPO

## 2017-12-17 VITALS — BP 117/62 | HR 55 | Temp 98.2°F | Resp 17 | Ht 65.0 in | Wt 142.0 lb

## 2017-12-17 DIAGNOSIS — Z853 Personal history of malignant neoplasm of breast: Secondary | ICD-10-CM

## 2017-12-17 DIAGNOSIS — Z0000 Encounter for general adult medical examination without abnormal findings: Secondary | ICD-10-CM | POA: Diagnosis not present

## 2017-12-17 DIAGNOSIS — M810 Age-related osteoporosis without current pathological fracture: Secondary | ICD-10-CM

## 2017-12-17 DIAGNOSIS — E2839 Other primary ovarian failure: Secondary | ICD-10-CM

## 2017-12-17 DIAGNOSIS — Z23 Encounter for immunization: Secondary | ICD-10-CM | POA: Diagnosis not present

## 2017-12-17 LAB — CBC WITH DIFFERENTIAL/PLATELET
BASOS PCT: 0.8 % (ref 0.0–3.0)
Basophils Absolute: 0 10*3/uL (ref 0.0–0.1)
EOS PCT: 1 % (ref 0.0–5.0)
Eosinophils Absolute: 0.1 10*3/uL (ref 0.0–0.7)
HEMATOCRIT: 36.8 % (ref 36.0–46.0)
Hemoglobin: 12.6 g/dL (ref 12.0–15.0)
LYMPHS PCT: 28.7 % (ref 12.0–46.0)
Lymphs Abs: 1.6 10*3/uL (ref 0.7–4.0)
MCHC: 34.2 g/dL (ref 30.0–36.0)
MCV: 100.3 fl — ABNORMAL HIGH (ref 78.0–100.0)
Monocytes Absolute: 0.4 10*3/uL (ref 0.1–1.0)
Monocytes Relative: 6.5 % (ref 3.0–12.0)
NEUTROS ABS: 3.5 10*3/uL (ref 1.4–7.7)
Neutrophils Relative %: 63 % (ref 43.0–77.0)
PLATELETS: 213 10*3/uL (ref 150.0–400.0)
RBC: 3.66 Mil/uL — ABNORMAL LOW (ref 3.87–5.11)
RDW: 13.6 % (ref 11.5–15.5)
WBC: 5.6 10*3/uL (ref 4.0–10.5)

## 2017-12-17 LAB — COMPREHENSIVE METABOLIC PANEL
ALBUMIN: 4.3 g/dL (ref 3.5–5.2)
ALT: 15 U/L (ref 0–35)
AST: 18 U/L (ref 0–37)
Alkaline Phosphatase: 59 U/L (ref 39–117)
BUN: 14 mg/dL (ref 6–23)
CHLORIDE: 103 meq/L (ref 96–112)
CO2: 29 meq/L (ref 19–32)
Calcium: 9.5 mg/dL (ref 8.4–10.5)
Creatinine, Ser: 0.67 mg/dL (ref 0.40–1.20)
GFR: 93.11 mL/min (ref >60.00)
Glucose, Bld: 94 mg/dL (ref 70–99)
POTASSIUM: 4.2 meq/L (ref 3.5–5.1)
SODIUM: 140 meq/L (ref 135–145)
Total Bilirubin: 0.5 mg/dL (ref 0.2–1.2)
Total Protein: 6.6 g/dL (ref 6.0–8.3)

## 2017-12-17 LAB — LIPID PANEL
CHOL/HDL RATIO: 3
CHOLESTEROL: 206 mg/dL — AB (ref 0–200)
HDL: 82.2 mg/dL (ref >39.00)
LDL CALC: 107 mg/dL — AB (ref 0–99)
NonHDL: 123.71
Triglycerides: 84 mg/dL (ref 0.0–149.0)
VLDL: 16.8 mg/dL (ref 0.0–40.0)

## 2017-12-17 LAB — TSH: TSH: 1.83 u[IU]/mL (ref 0.35–4.50)

## 2017-12-17 NOTE — Patient Instructions (Signed)
Continue doing brain stimulating activities (puzzles, reading, adult coloring books, staying active) to keep memory sharp.   Continue to eat heart healthy diet (full of fruits, vegetables, whole grains, lean protein, water--limit salt, fat, and sugar intake) and increase physical activity as tolerated.   Sherry Wells , Thank you for taking time to come for your Medicare Wellness Visit. I appreciate your ongoing commitment to your health goals. Please review the following plan we discussed and let me know if I can assist you in the future.   These are the goals we discussed: Goals    . Patient Stated     Maintain current health status and enjoy life.       This is a list of the screening recommended for you and due dates:  Health Maintenance  Topic Date Due  . Flu Shot  08/29/2017  . Pneumonia vaccines (2 of 2 - PPSV23) 12/06/2017  . DEXA scan (bone density measurement)  01/08/2018  . Mammogram  07/10/2018  . Tetanus Vaccine  10/16/2021  . Colon Cancer Screening  11/21/2021  .  Hepatitis C: One time screening is recommended by Center for Disease Control  (CDC) for  adults born from [birth year] through 1965.   Completed   Health Maintenance, Female Adopting a healthy lifestyle and getting preventive care can go a long way to promote health and wellness. Talk with your health care provider about what schedule of regular examinations is right for you. This is a good chance for you to check in with your provider about disease prevention and staying healthy. In between checkups, there are plenty of things you can do on your own. Experts have done a lot of research about which lifestyle changes and preventive measures are most likely to keep you healthy. Ask your health care provider for more information. Weight and diet Eat a healthy diet  Be sure to include plenty of vegetables, fruits, low-fat dairy products, and lean protein.  Do not eat a lot of foods high in solid fats, added sugars, or  salt.  Get regular exercise. This is one of the most important things you can do for your health. ? Most adults should exercise for at least 150 minutes each week. The exercise should increase your heart rate and make you sweat (moderate-intensity exercise). ? Most adults should also do strengthening exercises at least twice a week. This is in addition to the moderate-intensity exercise.  Maintain a healthy weight  Body mass index (BMI) is a measurement that can be used to identify possible weight problems. It estimates body fat based on height and weight. Your health care provider can help determine your BMI and help you achieve or maintain a healthy weight.  For females 53 years of age and older: ? A BMI below 18.5 is considered underweight. ? A BMI of 18.5 to 24.9 is normal. ? A BMI of 25 to 29.9 is considered overweight. ? A BMI of 30 and above is considered obese.  Watch levels of cholesterol and blood lipids  You should start having your blood tested for lipids and cholesterol at 67 years of age, then have this test every 5 years.  You may need to have your cholesterol levels checked more often if: ? Your lipid or cholesterol levels are high. ? You are older than 67 years of age. ? You are at high risk for heart disease.  Cancer screening Lung Cancer  Lung cancer screening is recommended for adults 71-20 years old who are  at high risk for lung cancer because of a history of smoking.  A yearly low-dose CT scan of the lungs is recommended for people who: ? Currently smoke. ? Have quit within the past 15 years. ? Have at least a 30-pack-year history of smoking. A pack year is smoking an average of one pack of cigarettes a day for 1 year.  Yearly screening should continue until it has been 15 years since you quit.  Yearly screening should stop if you develop a health problem that would prevent you from having lung cancer treatment.  Breast Cancer  Practice breast  self-awareness. This means understanding how your breasts normally appear and feel.  It also means doing regular breast self-exams. Let your health care provider know about any changes, no matter how small.  If you are in your 20s or 30s, you should have a clinical breast exam (CBE) by a health care provider every 1-3 years as part of a regular health exam.  If you are 70 or older, have a CBE every year. Also consider having a breast X-ray (mammogram) every year.  If you have a family history of breast cancer, talk to your health care provider about genetic screening.  If you are at high risk for breast cancer, talk to your health care provider about having an MRI and a mammogram every year.  Breast cancer gene (BRCA) assessment is recommended for women who have family members with BRCA-related cancers. BRCA-related cancers include: ? Breast. ? Ovarian. ? Tubal. ? Peritoneal cancers.  Results of the assessment will determine the need for genetic counseling and BRCA1 and BRCA2 testing.  Cervical Cancer Your health care provider may recommend that you be screened regularly for cancer of the pelvic organs (ovaries, uterus, and vagina). This screening involves a pelvic examination, including checking for microscopic changes to the surface of your cervix (Pap test). You may be encouraged to have this screening done every 3 years, beginning at age 72.  For women ages 21-65, health care providers may recommend pelvic exams and Pap testing every 3 years, or they may recommend the Pap and pelvic exam, combined with testing for human papilloma virus (HPV), every 5 years. Some types of HPV increase your risk of cervical cancer. Testing for HPV may also be done on women of any age with unclear Pap test results.  Other health care providers may not recommend any screening for nonpregnant women who are considered low risk for pelvic cancer and who do not have symptoms. Ask your health care provider if a  screening pelvic exam is right for you.  If you have had past treatment for cervical cancer or a condition that could lead to cancer, you need Pap tests and screening for cancer for at least 20 years after your treatment. If Pap tests have been discontinued, your risk factors (such as having a new sexual partner) need to be reassessed to determine if screening should resume. Some women have medical problems that increase the chance of getting cervical cancer. In these cases, your health care provider may recommend more frequent screening and Pap tests.  Colorectal Cancer  This type of cancer can be detected and often prevented.  Routine colorectal cancer screening usually begins at 67 years of age and continues through 67 years of age.  Your health care provider may recommend screening at an earlier age if you have risk factors for colon cancer.  Your health care provider may also recommend using home test kits to check  for hidden blood in the stool.  A small camera at the end of a tube can be used to examine your colon directly (sigmoidoscopy or colonoscopy). This is done to check for the earliest forms of colorectal cancer.  Routine screening usually begins at age 75.  Direct examination of the colon should be repeated every 5-10 years through 67 years of age. However, you may need to be screened more often if early forms of precancerous polyps or small growths are found.  Skin Cancer  Check your skin from head to toe regularly.  Tell your health care provider about any new moles or changes in moles, especially if there is a change in a mole's shape or color.  Also tell your health care provider if you have a mole that is larger than the size of a pencil eraser.  Always use sunscreen. Apply sunscreen liberally and repeatedly throughout the day.  Protect yourself by wearing long sleeves, pants, a wide-brimmed hat, and sunglasses whenever you are outside.  Heart disease, diabetes, and  high blood pressure  High blood pressure causes heart disease and increases the risk of stroke. High blood pressure is more likely to develop in: ? People who have blood pressure in the high end of the normal range (130-139/85-89 mm Hg). ? People who are overweight or obese. ? People who are African American.  If you are 78-56 years of age, have your blood pressure checked every 3-5 years. If you are 78 years of age or older, have your blood pressure checked every year. You should have your blood pressure measured twice-once when you are at a hospital or clinic, and once when you are not at a hospital or clinic. Record the average of the two measurements. To check your blood pressure when you are not at a hospital or clinic, you can use: ? An automated blood pressure machine at a pharmacy. ? A home blood pressure monitor.  If you are between 36 years and 14 years old, ask your health care provider if you should take aspirin to prevent strokes.  Have regular diabetes screenings. This involves taking a blood sample to check your fasting blood sugar level. ? If you are at a normal weight and have a low risk for diabetes, have this test once every three years after 67 years of age. ? If you are overweight and have a high risk for diabetes, consider being tested at a younger age or more often. Preventing infection Hepatitis B  If you have a higher risk for hepatitis B, you should be screened for this virus. You are considered at high risk for hepatitis B if: ? You were born in a country where hepatitis B is common. Ask your health care provider which countries are considered high risk. ? Your parents were born in a high-risk country, and you have not been immunized against hepatitis B (hepatitis B vaccine). ? You have HIV or AIDS. ? You use needles to inject street drugs. ? You live with someone who has hepatitis B. ? You have had sex with someone who has hepatitis B. ? You get hemodialysis  treatment. ? You take certain medicines for conditions, including cancer, organ transplantation, and autoimmune conditions.  Hepatitis C  Blood testing is recommended for: ? Everyone born from [birth year] through 1965. ? Anyone with known risk factors for hepatitis C.  Sexually transmitted infections (STIs)  You should be screened for sexually transmitted infections (STIs) including gonorrhea and chlamydia if: ? You are  sexually active and are younger than 67 years of age. ? You are older than 67 years of age and your health care provider tells you that you are at risk for this type of infection. ? Your sexual activity has changed since you were last screened and you are at an increased risk for chlamydia or gonorrhea. Ask your health care provider if you are at risk.  If you do not have HIV, but are at risk, it may be recommended that you take a prescription medicine daily to prevent HIV infection. This is called pre-exposure prophylaxis (PrEP). You are considered at risk if: ? You are sexually active and do not regularly use condoms or know the HIV status of your partner(s). ? You take drugs by injection. ? You are sexually active with a partner who has HIV.  Talk with your health care provider about whether you are at high risk of being infected with HIV. If you choose to begin PrEP, you should first be tested for HIV. You should then be tested every 3 months for as long as you are taking PrEP. Pregnancy  If you are premenopausal and you may become pregnant, ask your health care provider about preconception counseling.  If you may become pregnant, take 400 to 800 micrograms (mcg) of folic acid every day.  If you want to prevent pregnancy, talk to your health care provider about birth control (contraception). Osteoporosis and menopause  Osteoporosis is a disease in which the bones lose minerals and strength with aging. This can result in serious bone fractures. Your risk for osteoporosis  can be identified using a bone density scan.  If you are 55 years of age or older, or if you are at risk for osteoporosis and fractures, ask your health care provider if you should be screened.  Ask your health care provider whether you should take a calcium or vitamin D supplement to lower your risk for osteoporosis.  Menopause may have certain physical symptoms and risks.  Hormone replacement therapy may reduce some of these symptoms and risks. Talk to your health care provider about whether hormone replacement therapy is right for you. Follow these instructions at home:  Schedule regular health, dental, and eye exams.  Stay current with your immunizations.  Do not use any tobacco products including cigarettes, chewing tobacco, or electronic cigarettes.  If you are pregnant, do not drink alcohol.  If you are breastfeeding, limit how much and how often you drink alcohol.  Limit alcohol intake to no more than 1 drink per day for nonpregnant women. One drink equals 12 ounces of beer, 5 ounces of Adel Neyer, or 1 ounces of hard liquor.  Do not use street drugs.  Do not share needles.  Ask your health care provider for help if you need support or information about quitting drugs.  Tell your health care provider if you often feel depressed.  Tell your health care provider if you have ever been abused or do not feel safe at home. This information is not intended to replace advice given to you by your health care provider. Make sure you discuss any questions you have with your health care provider. Document Released: 07/31/2010 Document Revised: 06/23/2015 Document Reviewed: 10/19/2014 Elsevier Interactive Patient Education  2018 [place].  Influenza Virus Vaccine injection What is this medicine? INFLUENZA VIRUS VACCINE (in floo EN zuh VAHY ruhs vak SEEN) helps to reduce the risk of getting influenza also known as the flu. The vaccine only helps protect you  against some strains of  the flu. This medicine may be used for other purposes; ask your health care provider or pharmacist if you have questions. COMMON BRAND NAME(S): Afluria, Agriflu, Alfuria, FLUAD, Fluarix, Fluarix Quadrivalent, Flublok, Flublok Quadrivalent, FLUCELVAX, Flulaval, Fluvirin, Fluzone, Fluzone High-Dose, Fluzone Intradermal What should I tell my health care provider before I take this medicine? They need to know if you have any of these conditions: -bleeding disorder like hemophilia -fever or infection -Guillain-Barre syndrome or other neurological problems -immune system problems -infection with the human immunodeficiency virus (HIV) or AIDS -low blood platelet counts -multiple sclerosis -an unusual or allergic reaction to influenza virus vaccine, latex, other medicines, foods, dyes, or preservatives. Different brands of vaccines contain different allergens. Some may contain latex or eggs. Talk to your doctor about your allergies to make sure that you get the right vaccine. -pregnant or trying to get pregnant -breast-feeding How should I use this medicine? This vaccine is for injection into a muscle or under the skin. It is given by a health care professional. A copy of Vaccine Information Statements will be given before each vaccination. Read this sheet carefully each time. The sheet may change frequently. Talk to your healthcare provider to see which vaccines are right for you. Some vaccines should not be used in all age groups. Overdosage: If you think you have taken too much of this medicine contact a poison control center or emergency room at once. NOTE: This medicine is only for you. Do not share this medicine with others. What if I miss a dose? This does not apply. What may interact with this medicine? -chemotherapy or radiation therapy -medicines that lower your immune system like etanercept, anakinra, infliximab, and adalimumab -medicines that treat or prevent blood clots like  warfarin -phenytoin -steroid medicines like prednisone or cortisone -theophylline -vaccines This list may not describe all possible interactions. Give your health care provider a list of all the medicines, herbs, non-prescription drugs, or dietary supplements you use. Also tell them if you smoke, drink alcohol, or use illegal drugs. Some items may interact with your medicine. What should I watch for while using this medicine? Report any side effects that do not go away within 3 days to your doctor or health care professional. Call your health care provider if any unusual symptoms occur within 6 weeks of receiving this vaccine. You may still catch the flu, but the illness is not usually as bad. You cannot get the flu from the vaccine. The vaccine will not protect against colds or other illnesses that may cause fever. The vaccine is needed every year. What side effects may I notice from receiving this medicine? Side effects that you should report to your doctor or health care professional as soon as possible: -allergic reactions like skin rash, itching or hives, swelling of the face, lips, or tongue Side effects that usually do not require medical attention (report to your doctor or health care professional if they continue or are bothersome): -fever -headache -muscle aches and pains -pain, tenderness, redness, or swelling at the injection site -tiredness This list may not describe all possible side effects. Call your doctor for medical advice about side effects. You may report side effects to FDA at 1-800-FDA-1088. Where should I keep my medicine? The vaccine will be given by a health care professional in a clinic, pharmacy, doctor's office, or other health care setting. You will not be given vaccine doses to store at home. NOTE: This sheet is a summary. It may  not cover all possible information. If you have questions about this medicine, talk to your doctor, pharmacist, or health care  provider.  2018 Elsevier/Gold Standard (2014-08-06 10:07:28)

## 2017-12-17 NOTE — Assessment & Plan Note (Signed)
Walking regularly Taking calcium and vitamin D daily No resistant training or weights Was on Actonel and Boniva for a total of 5 years-discontinued 2012 Due for DEXA-ordered Check labs including CBC, CMP and TSH

## 2017-12-17 NOTE — Assessment & Plan Note (Signed)
Following with oncology No evidence of recurrence

## 2017-12-17 NOTE — Progress Notes (Addendum)
Subjective:   Sherry Wells is a 67 y.o. female who presents for Medicare Annual (Subsequent) preventive examination.  Review of Systems:  No ROS.  Medicare Wellness Visit. Additional risk factors are reflected in the social history.  Cardiac Risk Factors include: advanced age (>7men, >43 women);dyslipidemia Sleep patterns: feels rested on waking, gets up 1 times nightly to void and sleeps 7-8 hours nightly.    Home Safety/Smoke Alarms: Feels safe in home. Smoke alarms in place.  Living environment; residence and Firearm Safety: 1-story house/ trailer, no firearms. Lives with husband, no needs for DME, good support system Seat Belt Safety/Bike Helmet: Wears seat belt.     Objective:     Vitals: BP 117/62   Pulse (!) 55   Temp 98.2 F (36.8 C)   Resp 17   Ht 5\' 5"  (1.651 m)   Wt 142 lb (64.4 kg)   SpO2 99%   BMI 23.63 kg/m   Body mass index is 23.63 kg/m.  Advanced Directives 12/17/2017 07/28/2014 11/20/2013 07/29/2013 07/06/2013  Does Patient Have a Medical Advance Directive? No No No Patient does not have advance directive;Patient would not like information Patient does not have advance directive;Patient would not like information  Would patient like information on creating a medical advance directive? No - Patient declined No - patient declined information No - patient declined information - -    Tobacco Social History   Tobacco Use  Smoking Status Never Smoker  Smokeless Tobacco Never Used     Counseling given: Not Answered  Past Medical History:  Diagnosis Date  . Breast cancer ([place]) 06/2013   left UOQ  . Fibrocystic breast disease   . Hyperlipidemia   . Osteoporosis   . Personal history of radiation therapy   . PONV (postoperative nausea and vomiting)   . S/P radiation therapy 09/08/2013-10/07/2013    1) Left breast / 40.05Gy in 15 fractions/ 2) Left Breast Boost / 10 Gy in 5 fractions   Past Surgical History:  Procedure Laterality Date  . BREAST BIOPSY  Left 2015  . BREAST CYST ASPIRATION Left 1980  . BREAST EXCISIONAL BIOPSY Left 1985  . BREAST LUMPECTOMY  1980   cyst  . BREAST LUMPECTOMY Left 07/13/2013  . COLONOSCOPY  2002 & 2013   negative;  GI  . DILATION AND CURETTAGE OF UTERUS     x2  . TUBAL LIGATION  1994   Family History  Problem Relation Age of Onset  . Diabetes Mother   . Transient ischemic attack Mother        in 66s  . Hypertension Mother   . Valvular heart disease Father        Aortic valve  . Osteoporosis Sister   . Cardiomyopathy Sister   . Throat cancer Maternal Uncle   . Cancer Maternal Uncle        ? primary  . Valvular heart disease Paternal Uncle        Aortic valve   Social History   Socioeconomic History  . Marital status: Married    Spouse name: Not on file  . Number of children: Not on file  . Years of education: Not on file  . Highest education level: Not on file  Occupational History  . Occupation: retired  Scientific laboratory technician  . Financial resource strain: Not hard at all  . Food insecurity:    Worry: Never true    Inability: Never true  . Transportation needs:    Medical: No  Non-medical: No  Tobacco Use  . Smoking status: Never Smoker  . Smokeless tobacco: Never Used  Substance and Sexual Activity  . Alcohol use: No  . Drug use: No  . Sexual activity: Yes    Comment: meanarche 66, G0, menopause age 40, no HRT  Lifestyle  . Physical activity:    Days per week: 5 days    Minutes per session: 60 min  . Stress: Not at all  Relationships  . Social connections:    Talks on phone: More than three times a week    Gets together: More than three times a week    Attends religious service: More than 4 times per year    Active member of club or organization: Yes    Attends meetings of clubs or organizations: More than 4 times per year    Relationship status: Married  Other Topics Concern  . Not on file  Social History Narrative  . Not on file    Outpatient Encounter  Medications as of 12/17/2017  Medication Sig  . Calcium Carbonate-Vitamin D (CALTRATE 600+D) 600-400 MG-UNIT per tablet Take 1 tablet by mouth 2 (two) times daily.  . cholecalciferol (VITAMIN D) 400 UNITS TABS Take 400 Units by mouth daily.  Marland Kitchen doxycycline (ORACEA) 40 MG capsule Take 40 mg by mouth every morning. As needed for rosacea  . Multiple Vitamins-Minerals (MULTIVITAMIN WITH MINERALS) tablet Take 1 tablet by mouth daily.  Marland Kitchen oxybutynin (DITROPAN-XL) 10 MG 24 hr tablet Take 10 mg by mouth daily.  . vitamin C (ASCORBIC ACID) 500 MG tablet Take 500 mg by mouth daily.   No facility-administered encounter medications on file as of 12/17/2017.     Activities of Daily Living In your present state of health, do you have any difficulty performing the following activities: 12/17/2017  Hearing? N  Vision? N  Difficulty concentrating or making decisions? N  Walking or climbing stairs? N  Dressing or bathing? N  Doing errands, shopping? N  Preparing Food and eating ? N  Using the Toilet? N  In the past six months, have you accidently leaked urine? N  Do you have problems with loss of bowel control? N  Managing your Medications? N  Managing your Finances? N  Housekeeping or managing your Housekeeping? N  Some recent data might be hidden    Patient Care Team: Binnie Rail, MD as PCP - General (Internal Medicine)    Assessment:   This is a routine wellness examination for Maris. Physical assessment deferred to PCP.   Exercise Activities and Dietary recommendations Current Exercise Habits: Home exercise routine, Type of exercise: walking, Time (Minutes): 60, Frequency (Times/Week): 6, Weekly Exercise (Minutes/Week): 360, Intensity: Mild, Exercise limited by: None identified  Diet (meal preparation, eat out, water intake, caffeinated beverages, dairy products, fruits and vegetables): in general, a "healthy" diet  , well balanced. eats a variety of fruits and vegetables daily, limits  salt, fat/cholesterol, sugar,carbohydrates,caffeine, drinks 6-8 glasses of water daily.  Goals    . Patient Stated     Maintain current health status and enjoy life.       Fall Risk Fall Risk  12/17/2017 12/06/2016 12/05/2015 11/20/2013  Falls in the past year? 0 No No No    Depression Screen PHQ 2/9 Scores 12/17/2017 12/06/2016 12/05/2015 11/20/2013  PHQ - 2 Score 0 0 0 0  PHQ- 9 Score 0 - - -     Cognitive Function       Ad8 score reviewed for  issues:  Issues making decisions: no  Less interest in hobbies / activities: no  Repeats questions, stories (family complaining): no  Trouble using ordinary gadgets (microwave, computer, phone):no  Forgets the month or year: no  Mismanaging finances: no  Remembering appts: no  Daily problems with thinking and/or memory: no Ad8 score is= 0  Immunization History  Administered Date(s) Administered  . Influenza Whole 10/29/2009, 11/01/2011  . Influenza, High Dose Seasonal PF 12/06/2016, 12/17/2017  . Influenza,inj,Quad PF,6+ Mos 11/19/2012  . Influenza-Unspecified 10/28/2013, 11/23/2014, 11/23/2015  . Pneumococcal Conjugate-13 12/06/2016  . Pneumococcal Polysaccharide-23 12/17/2017  . Td 03/09/2003  . Tdap 10/17/2011  . Zoster 01/09/2016   Screening Tests Health Maintenance  Topic Date Due  . DEXA SCAN  01/08/2018  . MAMMOGRAM  07/10/2018  . TETANUS/TDAP  10/16/2021  . COLONOSCOPY  11/21/2021  . INFLUENZA VACCINE  Completed  . Hepatitis C Screening  Completed  . PNA vac Low Risk Adult  Completed      Plan:     Continue doing brain stimulating activities (puzzles, reading, adult coloring books, staying active) to keep memory sharp.   Continue to eat heart healthy diet (full of fruits, vegetables, whole grains, lean protein, water--limit salt, fat, and sugar intake) and increase physical activity as tolerated.  I have personally reviewed and noted the following in the patient's chart:   . Medical and social  history . Use of alcohol, tobacco or illicit drugs  . Current medications and supplements . Functional ability and status . Nutritional status . Physical activity . Advanced directives . List of other physicians . Vitals . Screenings to include cognitive, depression, and falls . Referrals and appointments  In addition, I have reviewed and discussed with patient certain preventive protocols, quality metrics, and best practice recommendations. A written personalized care plan for preventive services as well as general preventive health recommendations were provided to patient.     Michiel Cowboy, RN  12/17/2017   Medical screening examination/treatment/procedure(s) were performed by non-physician practitioner and as supervising physician I was immediately available for consultation/collaboration. I agree with above. Binnie Rail, MD

## 2017-12-18 ENCOUNTER — Encounter: Payer: Self-pay | Admitting: Internal Medicine

## 2017-12-23 ENCOUNTER — Ambulatory Visit (INDEPENDENT_AMBULATORY_CARE_PROVIDER_SITE_OTHER)
Admission: RE | Admit: 2017-12-23 | Discharge: 2017-12-23 | Disposition: A | Discharge status: Home / Self Care | Payer: PPO | Source: Ambulatory Visit | Attending: Internal Medicine | Admitting: Internal Medicine

## 2017-12-23 DIAGNOSIS — E2839 Other primary ovarian failure: Secondary | ICD-10-CM | POA: Diagnosis not present

## 2017-12-29 ENCOUNTER — Encounter: Payer: Self-pay | Admitting: Internal Medicine

## 2018-02-15 NOTE — Progress Notes (Signed)
Subjective:    Patient ID: Sherry Wells, female    DOB: [birth year], 68 y.o.   MRN: [id number]  HPI The patient is here for follow up to review her dexa.   She is taking calcium daily.  She is taking vitamin d daily.  She is exercising regularly.  She was on actonel and then [facility] for a total of 5 years (2007-2012)  She is taking 2600 units of vitamin d daily.  She takes calcium 600 mg twice dailly.  Walks daily.    Date of study: 12/13/17 Indication: screening for osteoporosis Comparison: none (please note that it is not possible to compare data from different instruments) Clinical data: Pt is a 68 y.o. female without history of fracture.   Results:  Lumbar spine L1-L4 Femoral neck (FN) 33% distal radius  T-score -1.9 RFN: -2.4 LFN: -2.6 n/a  Change in BMD from previous DXA test (%) Down 1.8% Up 1.8% n/a   Assessment: Patient has OSTEOPOROSIS   She has never been on any medication for her bones.     Medications and allergies reviewed with patient and updated if appropriate.  Patient Active Problem List   Diagnosis Date Noted  . History of breast cancer 12/05/2015  . Breast cancer of upper-outer quadrant of left female breast ([place]) 05/16/2015  . Breast cancer in situ 06/13/2013  . Osteoporosis 09/22/2008    Current Outpatient Medications on File Prior to Visit  Medication Sig Dispense Refill  . Calcium Carbonate-Vitamin D (CALTRATE 600+D) 600-400 MG-UNIT per tablet Take 1 tablet by mouth 2 (two) times daily.    . cholecalciferol (VITAMIN D) 400 UNITS TABS Take 400 Units by mouth daily.    Marland Kitchen doxycycline (ORACEA) 40 MG capsule Take 40 mg by mouth every morning. As needed for rosacea    . Multiple Vitamins-Minerals (MULTIVITAMIN WITH MINERALS) tablet Take 1 tablet by mouth daily.    Marland Kitchen oxybutynin (DITROPAN-XL) 10 MG 24 hr tablet Take 10 mg by mouth daily.    . vitamin C (ASCORBIC ACID) 500 MG tablet Take 500 mg by mouth daily.     No current facility-administered  medications on file prior to visit.     Past Medical History:  Diagnosis Date  . Breast cancer ([place]) 06/2013   left UOQ  . Fibrocystic breast disease   . Hyperlipidemia   . Osteoporosis   . Personal history of radiation therapy   . PONV (postoperative nausea and vomiting)   . S/P radiation therapy 09/08/2013-10/07/2013    1) Left breast / 40.05Gy in 15 fractions/ 2) Left Breast Boost / 10 Gy in 5 fractions    Past Surgical History:  Procedure Laterality Date  . BREAST BIOPSY Left 2015  . BREAST CYST ASPIRATION Left 1980  . BREAST EXCISIONAL BIOPSY Left 1985  . BREAST LUMPECTOMY  1980   cyst  . BREAST LUMPECTOMY Left 07/13/2013  . COLONOSCOPY  2002 & 2013   negative; [facility]  . DILATION AND CURETTAGE OF UTERUS     x2  . TUBAL LIGATION  1994    Social History   Socioeconomic History  . Marital status: Married    Spouse name: Not on file  . Number of children: Not on file  . Years of education: Not on file  . Highest education level: Not on file  Occupational History  . Occupation: retired  Scientific laboratory technician  . Financial resource strain: Not hard at all  . Food insecurity:    Worry: Never true  Inability: Never true  . Transportation needs:    Medical: No    Non-medical: No  Tobacco Use  . Smoking status: Never Smoker  . Smokeless tobacco: Never Used  Substance and Sexual Activity  . Alcohol use: No  . Drug use: No  . Sexual activity: Yes    Comment: meanarche 65, G0, menopause age 28, no HRT  Lifestyle  . Physical activity:    Days per week: 5 days    Minutes per session: 60 min  . Stress: Not at all  Relationships  . Social connections:    Talks on phone: More than three times a week    Gets together: More than three times a week    Attends religious service: More than 4 times per year    Active member of club or organization: Yes    Attends meetings of clubs or organizations: More than 4 times per year    Relationship status: Married  Other Topics  Concern  . Not on file  Social History Narrative  . Not on file    Family History  Problem Relation Age of Onset  . Diabetes Mother   . Transient ischemic attack Mother        in 44s  . Hypertension Mother   . Valvular heart disease Father        Aortic valve  . Osteoporosis Sister   . Cardiomyopathy Sister   . Throat cancer Maternal Uncle   . Cancer Maternal Uncle        ? primary  . Valvular heart disease Paternal Uncle        Aortic valve    Review of Systems     Objective:   Vitals:   02/17/18 0930  BP: 134/74  Pulse: (!) 51  Resp: 16  Temp: 98.8 F (37.1 C)  SpO2: 99%   BP Readings from Last 3 Encounters:  02/17/18 134/74  12/17/17 117/62  12/17/17 117/62   Wt Readings from Last 3 Encounters:  02/17/18 145 lb (65.8 kg)  12/17/17 142 lb (64.4 kg)  12/17/17 142 lb (64.4 kg)   Body mass index is 24.13 kg/m.   Physical Exam         Assessment & Plan:   25 minutes were spent face-to-face with the patient, over 50% of which was spent counseling regarding her osteoporosis, including causes of decreased bone density and risk of fracture.  We discussed treatment with calcium/vitamin d, exercise and medication options including fosamax, prolia, reclast and forteo.  Each medication was discussed including the possible side effects.  Discussed fall prevention.  Discussed follow up dexa scans.    See Problem List for Assessment and Plan of chronic medical problems.

## 2018-02-17 ENCOUNTER — Encounter: Payer: Self-pay | Admitting: Internal Medicine

## 2018-02-17 ENCOUNTER — Ambulatory Visit (INDEPENDENT_AMBULATORY_CARE_PROVIDER_SITE_OTHER): Payer: PPO | Admitting: Internal Medicine

## 2018-02-17 VITALS — BP 134/74 | HR 51 | Temp 98.8°F | Resp 16 | Ht 65.0 in | Wt 145.0 lb

## 2018-02-17 DIAGNOSIS — M810 Age-related osteoporosis without current pathological fracture: Secondary | ICD-10-CM

## 2018-02-17 MED ORDER — ALENDRONATE SODIUM 70 MG PO TABS: 70.0000 mg | ORAL_TABLET | ORAL | 3 refills | Status: DC

## 2018-02-17 NOTE — Patient Instructions (Signed)
Continue your calcium and vitamin d daily.  Continue walking daily.    Start fosamax weekly.      Osteoporosis  Osteoporosis is thinning and loss of density in your bones. Osteoporosis makes bones more brittle and fragile and more likely to break (fracture). Over time, osteoporosis can cause your bones to become so weak that they fracture after a minor fall. Bones in the hip, wrist, and spine are most likely to fracture due to osteoporosis. What are the causes? The exact cause of this condition is not known. What increases the risk? You may be at greater risk for osteoporosis if you:  Have a family history of the condition.  Have poor nutrition.  Use steroid medicines, such as prednisone.  Are female.  Are age 68 or older.  Smoke or have a history of smoking.  Are not physically active (are sedentary).  Are white (Caucasian) or of Asian descent.  Have a small body frame.  Take certain medicines, such as antiseizure medicines. What are the signs or symptoms? A fracture might be the first sign of osteoporosis, especially if the fracture results from a fall or injury that usually would not cause a bone to break. Other signs and symptoms include:  Pain in the neck or low back.  Stooped posture.  Loss of height. How is this diagnosed? This condition may be diagnosed based on:  Your medical history.  A physical exam.  A bone mineral density test, also called a DXA or DEXA test (dual-energy X-ray absorptiometry test). This test uses X-rays to measure the amount of minerals in your bones. How is this treated? The goal of treatment is to strengthen your bones and lower your risk for a fracture. Treatment may involve:  Making lifestyle changes, such as: ? Including foods with more calcium and vitamin D in your diet. ? Doing weight-bearing and muscle-strengthening exercises. ? Stopping tobacco use. ? Limiting alcohol intake.  Taking medicine to slow the process of bone  loss or to increase bone density.  Taking daily supplements of calcium and vitamin D.  Taking hormone replacement medicines, such as estrogen for women and testosterone for men.  Monitoring your levels of calcium and vitamin D. Follow these instructions at home:  Activity  Exercise as told by your health care provider. Ask your health care provider what exercises and activities are safe for you. You should do: ? Exercises that make you work against gravity (weight-bearing exercises), such as tai chi, yoga, or walking. ? Exercises to strengthen muscles, such as lifting weights. Lifestyle  Limit alcohol intake to no more than 1 drink a day for nonpregnant women and 2 drinks a day for men. One drink equals 12 oz of beer, 5 oz of wine, or 1 oz of hard liquor.  Do not use any products that contain nicotine or tobacco, such as cigarettes and e-cigarettes. If you need help quitting, ask your health care provider. Preventing falls  Use devices to help you move around (mobility aids) as needed, such as canes, walkers, scooters, or crutches.  Keep rooms well-lit and clutter-free.  Remove tripping hazards from walkways, including cords and throw rugs.  Install grab bars in bathrooms and safety rails on stairs.  Use rubber mats in the bathroom and other areas that are often wet or slippery.  Wear closed-toe shoes that fit well and support your feet. Wear shoes that have rubber soles or low heels.  Review your medicines with your health care provider. Some medicines can cause  dizziness or changes in blood pressure, which can increase your risk of falling. General instructions  Include calcium and vitamin D in your diet. Calcium is important for bone health, and vitamin D helps your body to absorb calcium. Good sources of calcium and vitamin D include: ? Certain fatty fish, such as salmon and tuna. ? Products that have calcium and vitamin D added to them (fortified products), such as  fortified cereals. ? Egg yolks. ? Cheese. ? Liver.  Take over-the-counter and prescription medicines only as told by your health care provider.  Keep all follow-up visits as told by your health care provider. This is important. Contact a health care provider if:  You have never been screened for osteoporosis and you are: ? A woman who is age 68 or older. ? A man who is age 68 or older. Get help right away if:  You fall or injure yourself. Summary  Osteoporosis is thinning and loss of density in your bones. This makes bones more brittle and fragile and more likely to break (fracture),even with minor falls.  The goal of treatment is to strengthen your bones and reduce your risk for a fracture.  Include calcium and vitamin D in your diet. Calcium is important for bone health, and vitamin D helps your body to absorb calcium.  Talk with your health care provider about screening for osteoporosis if you are a woman who is age 68 or older, or a man who is age 68 or older. This information is not intended to replace advice given to you by your health care provider. Make sure you discuss any questions you have with your health care provider. Document Released: 10/25/2004 Document Revised: 11/09/2016 Document Reviewed: 11/09/2016 Elsevier Interactive Patient Education  2019 [employer].

## 2018-02-17 NOTE — Assessment & Plan Note (Signed)
Continue current calcium and vitamin d supplementation Continue daily walking Fall prevention Reviewed medication options for treatment of OP and possible side effects She would like to try fosamax - will start Recheck dexa in 1-2 years to re-evaluate Will check vitamin d level with next blood work

## 2018-02-24 DIAGNOSIS — D1801 Hemangioma of skin and subcutaneous tissue: Secondary | ICD-10-CM | POA: Diagnosis not present

## 2018-02-24 DIAGNOSIS — D2272 Melanocytic nevi of left lower limb, including hip: Secondary | ICD-10-CM | POA: Diagnosis not present

## 2018-02-24 DIAGNOSIS — Z1283 Encounter for screening for malignant neoplasm of skin: Secondary | ICD-10-CM | POA: Diagnosis not present

## 2018-02-24 DIAGNOSIS — D485 Neoplasm of uncertain behavior of skin: Secondary | ICD-10-CM | POA: Diagnosis not present

## 2018-07-31 ENCOUNTER — Ambulatory Visit
Admission: RE | Admit: 2018-07-31 | Discharge: 2018-07-31 | Disposition: A | Discharge status: Home / Self Care | Payer: PPO | Source: Ambulatory Visit | Attending: Adult Health | Admitting: Adult Health

## 2018-07-31 ENCOUNTER — Other Ambulatory Visit: Payer: Self-pay | Admitting: Adult Health

## 2018-07-31 ENCOUNTER — Other Ambulatory Visit: Payer: Self-pay

## 2018-07-31 DIAGNOSIS — C50412 Malignant neoplasm of upper-outer quadrant of left female breast: Secondary | ICD-10-CM

## 2018-07-31 DIAGNOSIS — Z170 Estrogen receptor positive status [ER+]: Secondary | ICD-10-CM

## 2018-07-31 DIAGNOSIS — N6001 Solitary cyst of right breast: Secondary | ICD-10-CM | POA: Diagnosis not present

## 2018-07-31 DIAGNOSIS — N6310 Unspecified lump in the right breast, unspecified quadrant: Secondary | ICD-10-CM

## 2018-07-31 DIAGNOSIS — R922 Inconclusive mammogram: Secondary | ICD-10-CM | POA: Diagnosis not present

## 2018-11-05 ENCOUNTER — Telehealth: Payer: Self-pay | Admitting: Adult Health

## 2018-11-05 NOTE — Telephone Encounter (Signed)
R/s appt per 10/7 sch message - unable to reach pt . Left message with new appt date and time

## 2018-11-28 ENCOUNTER — Encounter: Payer: PPO | Admitting: Adult Health

## 2018-12-10 ENCOUNTER — Encounter: Payer: PPO | Admitting: Adult Health

## 2018-12-12 ENCOUNTER — Encounter: Payer: PPO | Admitting: Adult Health

## 2018-12-22 ENCOUNTER — Encounter: Payer: PPO | Admitting: Internal Medicine

## 2018-12-23 NOTE — Progress Notes (Signed)
Subjective:    Patient ID: Sherry Wells, female    DOB: [birth year], 68 y.o.   MRN: [id number]  HPI She is here for a physical exam.   She denies any changes since she was here last.  She has no concerns.  Medications and allergies reviewed with patient and updated if appropriate.  Patient Active Problem List   Diagnosis Date Noted  . History of breast cancer 12/05/2015  . Breast cancer of upper-outer quadrant of left female breast ([place]) 05/16/2015  . Breast cancer in situ 06/13/2013  . Osteoporosis 09/22/2008    Current Outpatient Medications on File Prior to Visit  Medication Sig Dispense Refill  . Calcium Carbonate-Vitamin D (CALTRATE 600+D) 600-400 MG-UNIT per tablet Take 1 tablet by mouth 2 (two) times daily.    . Multiple Vitamins-Minerals (MULTIVITAMIN WITH MINERALS) tablet Take 1 tablet by mouth daily.    . vitamin C (ASCORBIC ACID) 500 MG tablet Take 500 mg by mouth daily.     No current facility-administered medications on file prior to visit.     Past Medical History:  Diagnosis Date  . Breast cancer ([place]) 06/2013   left UOQ  . Fibrocystic breast disease   . Hyperlipidemia   . Osteoporosis   . Personal history of radiation therapy   . PONV (postoperative nausea and vomiting)   . S/P radiation therapy 09/08/2013-10/07/2013    1) Left breast / 40.05Gy in 15 fractions/ 2) Left Breast Boost / 10 Gy in 5 fractions    Past Surgical History:  Procedure Laterality Date  . BREAST BIOPSY Left 2015  . BREAST CYST ASPIRATION Left 1980  . BREAST EXCISIONAL BIOPSY Left 1985  . BREAST LUMPECTOMY  1980   cyst  . BREAST LUMPECTOMY Left 07/13/2013  . COLONOSCOPY  2002 & 2013   negative; [facility]  . DILATION AND CURETTAGE OF UTERUS     x2  . TUBAL LIGATION  1994    Social History   Socioeconomic History  . Marital status: Married    Spouse name: Not on file  . Number of children: Not on file  . Years of education: Not on file  . Highest education level: Not  on file  Occupational History  . Occupation: retired  Scientific laboratory technician  . Financial resource strain: Not hard at all  . Food insecurity    Worry: Never true    Inability: Never true  . Transportation needs    Medical: No    Non-medical: No  Tobacco Use  . Smoking status: Never Smoker  . Smokeless tobacco: Never Used  Substance and Sexual Activity  . Alcohol use: No  . Drug use: No  . Sexual activity: Yes    Comment: meanarche 4, G0, menopause age 89, no HRT  Lifestyle  . Physical activity    Days per week: 5 days    Minutes per session: 60 min  . Stress: Not at all  Relationships  . Social connections    Talks on phone: More than three times a week    Gets together: More than three times a week    Attends religious service: More than 4 times per year    Active member of club or organization: Yes    Attends meetings of clubs or organizations: More than 4 times per year    Relationship status: Married  Other Topics Concern  . Not on file  Social History Narrative  . Not on file    Family History  Problem Relation Age of Onset  . Diabetes Mother   . Transient ischemic attack Mother        in 66s  . Hypertension Mother   . Valvular heart disease Father        Aortic valve  . Osteoporosis Sister   . Cardiomyopathy Sister   . Throat cancer Maternal Uncle   . Cancer Maternal Uncle        ? primary  . Valvular heart disease Paternal Uncle        Aortic valve    Review of Systems  Constitutional: Negative for chills and fever.  HENT: Negative for ear pain and hearing loss.   Eyes: Negative for visual disturbance.  Respiratory: Negative for cough, shortness of breath and wheezing.   Cardiovascular: Negative for chest pain, palpitations and leg swelling.  Gastrointestinal: Negative for abdominal pain, blood in stool, constipation, diarrhea and nausea.       No gerd  Genitourinary: Negative for difficulty urinating, dysuria and hematuria.  Musculoskeletal: Positive  for arthralgias (fingers, knees).  Skin: Negative for color change.  Neurological: Positive for headaches (at night when laying  - on left side). Negative for dizziness, weakness, light-headedness and numbness.  Psychiatric/Behavioral: Negative for dysphoric mood. The patient is not nervous/anxious.        Objective:   Vitals:   12/24/18 1544  BP: 136/80  Pulse: (!) 59  Resp: 16  Temp: 98.2 F (36.8 C)  SpO2: 99%   Filed Weights   12/24/18 1544  Weight: 146 lb 12.8 oz (66.6 kg)   Body mass index is 24.43 kg/m.  BP Readings from Last 3 Encounters:  12/24/18 136/80  02/17/18 134/74  12/17/17 117/62    Wt Readings from Last 3 Encounters:  12/24/18 146 lb 12.8 oz (66.6 kg)  02/17/18 145 lb (65.8 kg)  12/17/17 142 lb (64.4 kg)     Physical Exam Constitutional: She appears well-developed and well-nourished. No distress.  HENT:  Head: Normocephalic and atraumatic.  Right Ear: External ear normal. Normal ear canal and TM Left Ear: External ear normal.  Normal ear canal and TM Mouth/Throat: Oropharynx is clear and moist.  Eyes: Conjunctivae and EOM are normal.  Neck: Neck supple. No tracheal deviation present. No thyromegaly present.  No carotid bruit  Cardiovascular: Normal rate, regular rhythm and normal heart sounds.   No murmur heard.  No edema. Pulmonary/Chest: Effort normal and breath sounds normal. No respiratory distress. She has no wheezes. She has no rales.  Breast: deferred   Abdominal: Soft. She exhibits no distension. There is no tenderness.  Lymphadenopathy: She has no cervical adenopathy.  Skin: Skin is warm and dry. She is not diaphoretic.  Psychiatric: She has a normal mood and affect. Her behavior is normal.        Assessment & Plan:   Physical exam: Screening blood work    ordered Immunizations  Flu vaccine today , others up to date Colonoscopy  Up to date  Mammogram  Up to date  Gyn  Up to date  Dexa  Up to date  Eye exams  Up to date   Exercise  Walking about one hr every day, video for silver sneakers Weight   Normal BMI Substance abuse  none   See Problem List for Assessment and Plan of chronic medical problems.   Follow-up in 1 year

## 2018-12-23 NOTE — Patient Instructions (Addendum)
Tests ordered today. Your results will be released to [place] (or called to you) after review.  If any changes need to be made, you will be notified at that same time.  All other Health Maintenance issues reviewed.   All recommended immunizations and age-appropriate screenings are up-to-date or discussed.  Flu immunization administered today.    Medications reviewed and updated.  Changes include :   none  Your prescription(s) have been submitted to your pharmacy. Please take as directed and contact our office if you believe you are having problem(s) with the medication(s).   Please followup in 1 year    Health Maintenance, Female Adopting a healthy lifestyle and getting preventive care are important in promoting health and wellness. Ask your health care provider about:  The right schedule for you to have regular tests and exams.  Things you can do on your own to prevent diseases and keep yourself healthy. What should I know about diet, weight, and exercise? Eat a healthy diet   Eat a diet that includes plenty of vegetables, fruits, low-fat dairy products, and lean protein.  Do not eat a lot of foods that are high in solid fats, added sugars, or sodium. Maintain a healthy weight Body mass index (BMI) is used to identify weight problems. It estimates body fat based on height and weight. Your health care provider can help determine your BMI and help you achieve or maintain a healthy weight. Get regular exercise Get regular exercise. This is one of the most important things you can do for your health. Most adults should:  Exercise for at least 150 minutes each week. The exercise should increase your heart rate and make you sweat (moderate-intensity exercise).  Do strengthening exercises at least twice a week. This is in addition to the moderate-intensity exercise.  Spend less time sitting. Even light physical activity can be beneficial. Watch cholesterol and blood lipids Have  your blood tested for lipids and cholesterol at 68 years of age, then have this test every 5 years. Have your cholesterol levels checked more often if:  Your lipid or cholesterol levels are high.  You are older than 68 years of age.  You are at high risk for heart disease. What should I know about cancer screening? Depending on your health history and family history, you may need to have cancer screening at various ages. This may include screening for:  Breast cancer.  Cervical cancer.  Colorectal cancer.  Skin cancer.  Lung cancer. What should I know about heart disease, diabetes, and high blood pressure? Blood pressure and heart disease  High blood pressure causes heart disease and increases the risk of stroke. This is more likely to develop in people who have high blood pressure readings, are of African descent, or are overweight.  Have your blood pressure checked: ? Every 3-5 years if you are 36-76 years of age. ? Every year if you are 11 years old or older. Diabetes Have regular diabetes screenings. This checks your fasting blood sugar level. Have the screening done:  Once every three years after age 41 if you are at a normal weight and have a low risk for diabetes.  More often and at a younger age if you are overweight or have a high risk for diabetes. What should I know about preventing infection? Hepatitis B If you have a higher risk for hepatitis B, you should be screened for this virus. Talk with your health care provider to find out if you are at  risk for hepatitis B infection. Hepatitis C Testing is recommended for:  Everyone born from [birth year] through 1965.  Anyone with known risk factors for hepatitis C. Sexually transmitted infections (STIs)  Get screened for STIs, including gonorrhea and chlamydia, if: ? You are sexually active and are younger than 68 years of age. ? You are older than 68 years of age and your health care provider tells you that you are at  risk for this type of infection. ? Your sexual activity has changed since you were last screened, and you are at increased risk for chlamydia or gonorrhea. Ask your health care provider if you are at risk.  Ask your health care provider about whether you are at high risk for HIV. Your health care provider may recommend a prescription medicine to help prevent HIV infection. If you choose to take medicine to prevent HIV, you should first get tested for HIV. You should then be tested every 3 months for as long as you are taking the medicine. Pregnancy  If you are about to stop having your period (premenopausal) and you may become pregnant, seek counseling before you get pregnant.  Take 400 to 800 micrograms (mcg) of folic acid every day if you become pregnant.  Ask for birth control (contraception) if you want to prevent pregnancy. Osteoporosis and menopause Osteoporosis is a disease in which the bones lose minerals and strength with aging. This can result in bone fractures. If you are 32 years old or older, or if you are at risk for osteoporosis and fractures, ask your health care provider if you should:  Be screened for bone loss.  Take a calcium or vitamin D supplement to lower your risk of fractures.  Be given hormone replacement therapy (HRT) to treat symptoms of menopause. Follow these instructions at home: Lifestyle  Do not use any products that contain nicotine or tobacco, such as cigarettes, e-cigarettes, and chewing tobacco. If you need help quitting, ask your health care provider.  Do not use street drugs.  Do not share needles.  Ask your health care provider for help if you need support or information about quitting drugs. Alcohol use  Do not drink alcohol if: ? Your health care provider tells you not to drink. ? You are pregnant, may be pregnant, or are planning to become pregnant.  If you drink alcohol: ? Limit how much you use to 0-1 drink a day. ? Limit intake if you  are breastfeeding.  Be aware of how much alcohol is in your drink. In the [place] one drink equals one 12 oz bottle of beer (355 mL), one 5 oz glass of wine (148 mL), or one 1 oz glass of hard liquor (44 mL). General instructions  Schedule regular health, dental, and eye exams.  Stay current with your vaccines.  Tell your health care provider if: ? You often feel depressed. ? You have ever been abused or do not feel safe at home. Summary  Adopting a healthy lifestyle and getting preventive care are important in promoting health and wellness.  Follow your health care provider's instructions about healthy diet, exercising, and getting tested or screened for diseases.  Follow your health care provider's instructions on monitoring your cholesterol and blood pressure. This information is not intended to replace advice given to you by your health care provider. Make sure you discuss any questions you have with your health care provider. Document Released: 07/31/2010 Document Revised: 01/08/2018 Document Reviewed: 01/08/2018 Elsevier Patient Education  2020 Elsevier  Inc.  

## 2018-12-24 ENCOUNTER — Encounter: Payer: Self-pay | Admitting: Internal Medicine

## 2018-12-24 ENCOUNTER — Other Ambulatory Visit: Payer: Self-pay

## 2018-12-24 ENCOUNTER — Ambulatory Visit (INDEPENDENT_AMBULATORY_CARE_PROVIDER_SITE_OTHER): Payer: PPO | Admitting: Internal Medicine

## 2018-12-24 ENCOUNTER — Other Ambulatory Visit (INDEPENDENT_AMBULATORY_CARE_PROVIDER_SITE_OTHER): Payer: PPO

## 2018-12-24 VITALS — BP 136/80 | HR 59 | Temp 98.2°F | Resp 16 | Ht 65.0 in | Wt 146.8 lb

## 2018-12-24 DIAGNOSIS — Z0000 Encounter for general adult medical examination without abnormal findings: Secondary | ICD-10-CM

## 2018-12-24 DIAGNOSIS — Z23 Encounter for immunization: Secondary | ICD-10-CM

## 2018-12-24 DIAGNOSIS — Z853 Personal history of malignant neoplasm of breast: Secondary | ICD-10-CM | POA: Diagnosis not present

## 2018-12-24 DIAGNOSIS — M810 Age-related osteoporosis without current pathological fracture: Secondary | ICD-10-CM

## 2018-12-24 DIAGNOSIS — N3281 Overactive bladder: Secondary | ICD-10-CM | POA: Diagnosis not present

## 2018-12-24 LAB — CBC WITH DIFFERENTIAL/PLATELET
Basophils Absolute: 0 10*3/uL (ref 0.0–0.1)
Basophils Relative: 0.5 % (ref 0.0–3.0)
Eosinophils Absolute: 0.1 10*3/uL (ref 0.0–0.7)
Eosinophils Relative: 1.1 % (ref 0.0–5.0)
HCT: 40.5 % (ref 36.0–46.0)
Hemoglobin: 13.6 g/dL (ref 12.0–15.0)
Lymphocytes Relative: 32 % (ref 12.0–46.0)
Lymphs Abs: 1.9 10*3/uL (ref 0.7–4.0)
MCHC: 33.5 g/dL (ref 30.0–36.0)
MCV: 100.5 fl — ABNORMAL HIGH (ref 78.0–100.0)
Monocytes Absolute: 0.4 10*3/uL (ref 0.1–1.0)
Monocytes Relative: 6.9 % (ref 3.0–12.0)
Neutro Abs: 3.5 10*3/uL (ref 1.4–7.7)
Neutrophils Relative %: 59.5 % (ref 43.0–77.0)
Platelets: 208 10*3/uL (ref 150.0–400.0)
RBC: 4.03 Mil/uL (ref 3.87–5.11)
RDW: 13.4 % (ref 11.5–15.5)
WBC: 5.9 10*3/uL (ref 4.0–10.5)

## 2018-12-24 LAB — LIPID PANEL
Cholesterol: 210 mg/dL — ABNORMAL HIGH (ref 0–200)
HDL: 82.4 mg/dL (ref >39.00)
LDL Cholesterol: 116 mg/dL — ABNORMAL HIGH (ref 0–99)
NonHDL: 127.55
Total CHOL/HDL Ratio: 3
Triglycerides: 58 mg/dL (ref 0.0–149.0)
VLDL: 11.6 mg/dL (ref 0.0–40.0)

## 2018-12-24 LAB — COMPREHENSIVE METABOLIC PANEL
ALT: 17 U/L (ref 0–35)
AST: 18 U/L (ref 0–37)
Albumin: 4.2 g/dL (ref 3.5–5.2)
Alkaline Phosphatase: 53 U/L (ref 39–117)
BUN: 15 mg/dL (ref 6–23)
CO2: 30 mEq/L (ref 19–32)
Calcium: 9.6 mg/dL (ref 8.4–10.5)
Chloride: 102 mEq/L (ref 96–112)
Creatinine, Ser: 0.69 mg/dL (ref 0.40–1.20)
GFR: 84.42 mL/min (ref >60.00)
Glucose, Bld: 94 mg/dL (ref 70–99)
Potassium: 3.9 mEq/L (ref 3.5–5.1)
Sodium: 139 mEq/L (ref 135–145)
Total Bilirubin: 0.7 mg/dL (ref 0.2–1.2)
Total Protein: 6.8 g/dL (ref 6.0–8.3)

## 2018-12-24 LAB — VITAMIN D 25 HYDROXY (VIT D DEFICIENCY, FRACTURES): VITD: 53.46 ng/mL (ref 30.00–100.00)

## 2018-12-24 LAB — TSH: TSH: 1.8 u[IU]/mL (ref 0.35–4.50)

## 2018-12-24 MED ORDER — ALENDRONATE SODIUM 70 MG PO TABS: 70.0000 mg | ORAL_TABLET | ORAL | 3 refills | Status: DC

## 2018-12-24 MED ORDER — OXYBUTYNIN CHLORIDE ER 10 MG PO TB24: 10.0000 mg | ORAL_TABLET | Freq: Every day | ORAL | 3 refills | Status: DC

## 2018-12-24 NOTE — Assessment & Plan Note (Signed)
Walking for about an hour every day, does videos through Silver sneakers twice a week Taking calcium and vitamin D daily  Taking Fosamax weekly and tolerating it without side effects-started 01/2018 DEXA up-to-date-due next year Will check vitamin D level

## 2018-12-24 NOTE — Assessment & Plan Note (Signed)
Has appointment with oncology next month Mammogram up-to-date No evidence of recurrence

## 2018-12-24 NOTE — Assessment & Plan Note (Signed)
Taking oxybutynin daily, which is effective We will continue-refilled

## 2018-12-26 ENCOUNTER — Encounter: Payer: Self-pay | Admitting: Internal Medicine

## 2019-01-09 ENCOUNTER — Inpatient Hospital Stay: Payer: PPO | Attending: Adult Health | Admitting: Adult Health

## 2019-01-09 ENCOUNTER — Encounter: Payer: Self-pay | Admitting: Adult Health

## 2019-01-09 ENCOUNTER — Other Ambulatory Visit: Payer: Self-pay

## 2019-01-09 VITALS — BP 125/73 | HR 79 | Temp 98.2°F | Resp 18 | Ht 65.0 in | Wt 146.1 lb

## 2019-01-09 DIAGNOSIS — Z853 Personal history of malignant neoplasm of breast: Secondary | ICD-10-CM

## 2019-01-09 DIAGNOSIS — Z8249 Family history of ischemic heart disease and other diseases of the circulatory system: Secondary | ICD-10-CM | POA: Insufficient documentation

## 2019-01-09 DIAGNOSIS — E785 Hyperlipidemia, unspecified: Secondary | ICD-10-CM | POA: Insufficient documentation

## 2019-01-09 DIAGNOSIS — Z86000 Personal history of in-situ neoplasm of breast: Secondary | ICD-10-CM | POA: Insufficient documentation

## 2019-01-09 NOTE — Progress Notes (Signed)
CLINIC:  Survivorship   REASON FOR VISIT:  Routine follow-up for history of breast cancer.   BRIEF ONCOLOGIC HISTORY:  Oncology History  Breast cancer of upper-outer quadrant of left female breast ([place])  06/11/2013 Initial Biopsy   Left breast core needle bx: DCIS, high grade, with comedonecrosis, ER- (0%), PR- (0%)   07/13/2013 Definitive Surgery   Left lumpectomy/SLNB: DCIS, high grade, comedonecrosis. 0/3 LN   07/13/2013 Pathologic Stage   Stage 0: Tis N0   09/08/2013 - 10/07/2013 Radiation Therapy   Adjuvant RT: Left breast: 40.05 Gy over 15 fractions; left breast bost: 10 Gy over 5 fractions; total dose 50.05 Gy      INTERVAL HISTORY:  Sherry Wells presents to the [facility] today for routine follow-up for her history of breast cancer.    Since her last visit she underwent bilateral mammogram in 07/2018.  A small cyst was noted in the left breast, there was no sign of malignancy.  She has breast density category C.  Sherry Wells is doing well today.  She tells me that she has no issues and she is up to date with her cancer screenings and sees her PCP regularly.    Sherry Wells is exercising regularly by walking.  She and her husband are retired, and they are practicing appropriate pandemic precautions.    REVIEW OF SYSTEMS:  Review of Systems  Constitutional: Negative for appetite change, chills, fatigue, fever and unexpected weight change.  HENT:   Negative for hearing loss and lump/mass.   Eyes: Negative for eye problems and icterus.  Respiratory: Negative for chest tightness, cough, shortness of breath and wheezing.   Cardiovascular: Negative for chest pain, leg swelling and palpitations.  Gastrointestinal: Negative for abdominal distention, abdominal pain, blood in stool, constipation, diarrhea, nausea and vomiting.  Endocrine: Negative for hot flashes.  Genitourinary: Negative for difficulty urinating.   Skin: Negative for itching and rash.  Neurological: Negative for  dizziness, extremity weakness, headaches and numbness.  Hematological: Negative for adenopathy. Does not bruise/bleed easily.  Psychiatric/Behavioral: Negative for depression. The patient is not nervous/anxious.   Breast: Denies any new nodularity, masses, tenderness, nipple changes, or nipple discharge.       PAST MEDICAL/SURGICAL HISTORY:  Past Medical History:  Diagnosis Date  . Breast cancer ([place]) 06/2013   left UOQ  . Fibrocystic breast disease   . Hyperlipidemia   . Osteoporosis   . Personal history of radiation therapy   . PONV (postoperative nausea and vomiting)   . S/P radiation therapy 09/08/2013-10/07/2013    1) Left breast / 40.05Gy in 15 fractions/ 2) Left Breast Boost / 10 Gy in 5 fractions   Past Surgical History:  Procedure Laterality Date  . BREAST BIOPSY Left 2015  . BREAST CYST ASPIRATION Left 1980  . BREAST EXCISIONAL BIOPSY Left 1985  . BREAST LUMPECTOMY  1980   cyst  . BREAST LUMPECTOMY Left 07/13/2013  . COLONOSCOPY  2002 & 2013   negative; [facility]  . DILATION AND CURETTAGE OF UTERUS     x2  . TUBAL LIGATION  1994     ALLERGIES:  No Known Allergies   CURRENT MEDICATIONS:  Outpatient Encounter Medications as of 01/09/2019  Medication Sig  . alendronate (FOSAMAX) 70 MG tablet Take 1 tablet (70 mg total) by mouth every 7 (seven) days. Take with a full glass of water on an empty stomach.  . Calcium Carbonate-Vitamin D (CALTRATE 600+D) 600-400 MG-UNIT per tablet Take 1 tablet by mouth 2 (two) times  daily.  . Multiple Vitamins-Minerals (MULTIVITAMIN WITH MINERALS) tablet Take 1 tablet by mouth daily.  Marland Kitchen oxybutynin (DITROPAN-XL) 10 MG 24 hr tablet Take 1 tablet (10 mg total) by mouth daily.  Marland Kitchen SHINGRIX injection   . vitamin C (ASCORBIC ACID) 500 MG tablet Take 500 mg by mouth daily.   No facility-administered encounter medications on file as of 01/09/2019.     ONCOLOGIC FAMILY HISTORY:  Family History  Problem Relation Age of Onset  . Diabetes  Mother   . Transient ischemic attack Mother        in 22s  . Hypertension Mother   . Valvular heart disease Father        Aortic valve  . Osteoporosis Sister   . Cardiomyopathy Sister   . Throat cancer Maternal Uncle   . Cancer Maternal Uncle        ? primary  . Valvular heart disease Paternal Uncle        Aortic valve    GENETIC COUNSELING/TESTING: Not at this time  SOCIAL HISTORY:  Sherry Wells is married and lives with her husband in [place], [place].  She has 1 stepdaughter and grandson and they live in [place], [place].  [name] is currently retired.  She denies any current or history of tobacco, alcohol, or illicit drug use.  (reviewed in 12/2018)   PHYSICAL EXAMINATION:  Vital Signs: Vitals:   01/09/19 1115  BP: 125/73  Pulse: 79  Resp: 18  Temp: 98.2 F (36.8 C)  SpO2: 100%   Filed Weights   01/09/19 1115  Weight: 146 lb 1.6 oz (66.3 kg)   General: Well-nourished, well-appearing female in no acute distress.  Unaccompanied today.   HEENT: Head is normocephalic.  Pupils equal and reactive to light. Conjunctivae clear without exudate.  Sclerae anicteric. Oral mucosa is pink, moist.  Oropharynx is pink without lesions or erythema.  Lymph: No cervical, supraclavicular, or infraclavicular lymphadenopathy noted on palpation.  Cardiovascular: Regular rate and rhythm.Marland Kitchen Respiratory: Clear to auscultation bilaterally. Chest expansion symmetric; breathing non-labored.  Breast Exam:  -Left breast: No appreciable masses on palpation. No skin redness, thickening, or peau d'orange appearance; no nipple retraction or nipple discharge; mild distortion in symmetry at previous lumpectomy site well healed scar without erythema or nodularity.  -Right breast: No appreciable masses on palpation. No skin redness, thickening, or peau d'orange appearance; no nipple retraction or nipple discharge;  -Axilla: No axillary adenopathy bilaterally.  GI: Abdomen soft and round;  non-tender, non-distended. Bowel sounds normoactive. No hepatosplenomegaly.   GU: Deferred.  Neuro: No focal deficits. Steady gait.  Psych: Mood and affect normal and appropriate for situation.  MSK: No focal spinal tenderness to palpation, full range of motion in bilateral upper extremities Extremities: No edema. Skin: Warm and dry.  LABORATORY DATA:  None for this visit   DIAGNOSTIC IMAGING:  Most recent mammogram:   CLINICAL DATA:  LEFT lumpectomy in 2015.  EXAM: DIGITAL DIAGNOSTIC BILATERAL MAMMOGRAM WITH CAD AND TOMO  ULTRASOUND RIGHT BREAST  COMPARISON:  07/09/2017 and earlier  ACR Breast Density Category c: The breast tissue is heterogeneously dense, which may obscure small masses.  FINDINGS: Postoperative changes are identified in the LEFT breast. Within the MEDIAL aspect of the RIGHT breast there is a circumscribed oval mass further evaluated with ultrasound. No suspicious microcalcifications or non surgical distortion identified in either breast.  Mammographic images were processed with CAD.  On physical exam, I palpate no abnormality in the MEDIAL aspect of the RIGHT  breast.  Targeted ultrasound is performed, showing a circumscribed parallel oval anechoic mass with single internal septation in the 9 o'clock location of the RIGHT breast 2 centimeters from the nipple which measures 0.7 x 0.5 x 0.4 centimeters. There is no associated solid component or internal blood flow.  IMPRESSION: Small cyst in the 9 o'clock location of the RIGHT breast.  No mammographic or ultrasound evidence for malignancy.  RECOMMENDATION: Screening mammogram in one year.(Code:SM-B-01Y)  I have discussed the findings and recommendations with the patient. Results were also provided in writing at the conclusion of the visit. If applicable, a reminder letter will be sent to the patient regarding the next appointment.  BI-RADS CATEGORY  2: Benign.   Electronically  Signed   By: Nolon Nations M.D.   On: 07/31/2018 12:14    ASSESSMENT AND PLAN:  Ms.. Pila is a pleasant 68 y.o. female with history of Stage 0 left breast DCIS, ER-/PR-, diagnosed in 05/2013, treated with lumpectomy and adjuvant radiation therapy.  [facility] for surveillance and routine follow-up.   1. History of breast cancer:  Ms. Lobato is currently clinically and radiographically without evidence of disease or recurrence of breast cancer. She will be due for mammogram in 07/2019.  She has done well with everything and will now graduate from follow up with [place].  We will see her back on an as needed basis.  She should have annual 3d screening mammograms, and follow health maintenance and wellness recommendations as well as cancer screening recommendations listed below.  She plans on having annual breast exams with her gynecologist.   2. Cancer screening:  Due to Ms. Feig's history and her age, she should receive screening for skin cancers, colon cancer, and gynecologic cancers. She was encouraged to follow-up with her PCP for appropriate cancer screenings.   3. Health maintenance and wellness promotion: Ms. Malkiewicz was encouraged to consume 5-7 servings of fruits and vegetables per day. She was also encouraged to engage in moderate to vigorous exercise for 30 minutes per day most days of the week. She was instructed to limit her alcohol consumption and continue to abstain from tobacco use.    Dispo:  -Return to cancer center PRN -Mammogram in 07/2019   A total of (15) minutes of face-to-face time was spent with this patient with greater than 50% of that time in counseling and care-coordination.   Gardenia Phlegm, NP [facility] [phone]   Note: PRIMARY CARE PROVIDER [place]innie Rail, [place] [phone]

## 2019-06-22 DIAGNOSIS — Z1151 Encounter for screening for human papillomavirus (HPV): Secondary | ICD-10-CM | POA: Diagnosis not present

## 2019-06-22 DIAGNOSIS — Z124 Encounter for screening for malignant neoplasm of cervix: Secondary | ICD-10-CM | POA: Diagnosis not present

## 2019-06-22 DIAGNOSIS — Z01419 Encounter for gynecological examination (general) (routine) without abnormal findings: Secondary | ICD-10-CM | POA: Diagnosis not present

## 2019-07-20 ENCOUNTER — Encounter: Payer: Self-pay | Admitting: Internal Medicine

## 2019-07-20 ENCOUNTER — Other Ambulatory Visit: Payer: Self-pay | Admitting: Internal Medicine

## 2019-07-20 DIAGNOSIS — Z1231 Encounter for screening mammogram for malignant neoplasm of breast: Secondary | ICD-10-CM

## 2019-08-06 ENCOUNTER — Ambulatory Visit
Admission: RE | Admit: 2019-08-06 | Discharge: 2019-08-06 | Disposition: A | Discharge status: Home / Self Care | Payer: PPO | Source: Ambulatory Visit | Attending: Internal Medicine | Admitting: Internal Medicine

## 2019-08-06 ENCOUNTER — Other Ambulatory Visit: Payer: Self-pay

## 2019-08-06 DIAGNOSIS — Z1231 Encounter for screening mammogram for malignant neoplasm of breast: Secondary | ICD-10-CM

## 2019-11-19 ENCOUNTER — Encounter: Payer: Self-pay | Admitting: Internal Medicine

## 2019-11-19 ENCOUNTER — Ambulatory Visit: Payer: PPO | Attending: Internal Medicine

## 2019-11-19 DIAGNOSIS — Z23 Encounter for immunization: Secondary | ICD-10-CM

## 2019-11-19 NOTE — Progress Notes (Signed)
   Covid-19 Vaccination Clinic  Name:  Sherry Wells    MRN: [id number] DOB: 12-05-1950  11/19/2019  Sherry Wells was observed post Covid-19 immunization for 15 minutes without incident. She was provided with Vaccine Information Sheet and instruction to access the V-Safe system.   Sherry Wells was instructed to call 911 with any severe reactions post vaccine: Marland Kitchen Difficulty breathing  . Swelling of face and throat  . A fast heartbeat  . A bad rash all over body  . Dizziness and weakness

## 2019-11-24 ENCOUNTER — Other Ambulatory Visit: Payer: Self-pay | Admitting: Internal Medicine

## 2019-12-16 DIAGNOSIS — H43813 Vitreous degeneration, bilateral: Secondary | ICD-10-CM | POA: Diagnosis not present

## 2019-12-16 DIAGNOSIS — H2513 Age-related nuclear cataract, bilateral: Secondary | ICD-10-CM | POA: Diagnosis not present

## 2019-12-16 DIAGNOSIS — H04123 Dry eye syndrome of bilateral lacrimal glands: Secondary | ICD-10-CM | POA: Diagnosis not present

## 2019-12-31 NOTE — Progress Notes (Signed)
Subjective:    Patient ID: Sherry Wells, female    DOB: Nov 09, 1950, 69 y.o.   MRN: [id number]   This visit occurred during the SARS-CoV-2 public health emergency.  Safety protocols were in place, including screening questions prior to the visit, additional usage of staff PPE, and extensive cleaning of exam room while observing appropriate contact time as indicated for disinfecting solutions.    HPI She is here for a physical exam.   She denies any changes in her health and has no concerns.  Medications and allergies reviewed with patient and updated if appropriate.  Patient Active Problem List   Diagnosis Date Noted  . Overactive bladder 12/24/2018  . History of breast cancer 12/05/2015  . Breast cancer of upper-outer quadrant of left female breast ([place]) 05/16/2015  . Breast cancer in situ 06/13/2013  . Osteoporosis 09/22/2008    Current Outpatient Medications on File Prior to Visit  Medication Sig Dispense Refill  . alendronate (FOSAMAX) 70 MG tablet TAKE 1 TABLET BY MOUTH EVERY 7 DAYS. TAKE WITH A FULL GLASS OF WATER ON AN EMPTY STOMACH 12 tablet 3  . Calcium Carbonate-Vitamin D (CALTRATE 600+D) 600-400 MG-UNIT per tablet Take 1 tablet by mouth 2 (two) times daily.    . Multiple Vitamins-Minerals (MULTIVITAMIN WITH MINERALS) tablet Take 1 tablet by mouth daily.    Marland Kitchen oxybutynin (DITROPAN-XL) 10 MG 24 hr tablet Take 1 tablet (10 mg total) by mouth daily. 90 tablet 3  . SHINGRIX injection     . vitamin C (ASCORBIC ACID) 500 MG tablet Take 500 mg by mouth daily.     No current facility-administered medications on file prior to visit.    Past Medical History:  Diagnosis Date  . Breast cancer ([place]) 06/2013   left UOQ  . Fibrocystic breast disease   . Hyperlipidemia   . Osteoporosis   . Personal history of radiation therapy   . PONV (postoperative nausea and vomiting)   . S/P radiation therapy 09/08/2013-10/07/2013    1) Left breast / 40.05Gy in 15 fractions/ 2) Left  Breast Boost / 10 Gy in 5 fractions    Past Surgical History:  Procedure Laterality Date  . BREAST BIOPSY Left 2015  . BREAST CYST ASPIRATION Left 1980  . BREAST EXCISIONAL BIOPSY Left 1985  . BREAST LUMPECTOMY  1980   cyst  . BREAST LUMPECTOMY Left 07/13/2013  . COLONOSCOPY  2002 & 2013   negative; [facility]  . DILATION AND CURETTAGE OF UTERUS     x2  . TUBAL LIGATION  1994    Social History   Socioeconomic History  . Marital status: Married    Spouse name: Not on file  . Number of children: Not on file  . Years of education: Not on file  . Highest education level: Not on file  Occupational History  . Occupation: retired  Tobacco Use  . Smoking status: Never Smoker  . Smokeless tobacco: Never Used  Vaping Use  . Vaping Use: Never used  Substance and Sexual Activity  . Alcohol use: No  . Drug use: No  . Sexual activity: Yes    Comment: meanarche 53, G0, menopause age 4, no HRT  Other Topics Concern  . Not on file  Social History Narrative  . Not on file   Social Determinants of Health   Financial Resource Strain:   . Difficulty of Paying Living Expenses: Not on file  Food Insecurity:   . Worried About [employer] of  Food in the Last Year: Not on file  . Ran Out of Food in the Last Year: Not on file  Transportation Needs:   . Lack of Transportation (Medical): Not on file  . Lack of Transportation (Non-Medical): Not on file  Physical Activity:   . Days of Exercise per Week: Not on file  . Minutes of Exercise per Session: Not on file  Stress:   . Feeling of Stress : Not on file  Social Connections:   . Frequency of Communication with Friends and Family: Not on file  . Frequency of Social Gatherings with Friends and Family: Not on file  . Attends Religious Services: Not on file  . Active Member of Clubs or Organizations: Not on file  . Attends Archivist Meetings: Not on file  . Marital Status: Not on file    Family History  Problem Relation  Age of Onset  . Diabetes Mother   . Transient ischemic attack Mother        in 67s  . Hypertension Mother   . Valvular heart disease Father        Aortic valve  . Osteoporosis Sister   . Cardiomyopathy Sister   . Throat cancer Maternal Uncle   . Cancer Maternal Uncle        ? primary  . Valvular heart disease Paternal Uncle        Aortic valve    Review of Systems  Constitutional: Negative for chills and fever.  Eyes: Negative for visual disturbance.  Respiratory: Negative for cough, shortness of breath and wheezing.   Cardiovascular: Negative for chest pain, palpitations and leg swelling.  Gastrointestinal: Negative for abdominal pain, blood in stool, constipation, diarrhea and nausea.       No gerd  Genitourinary: Negative for dysuria and hematuria.  Musculoskeletal: Positive for arthralgias (knees). Negative for back pain.  Skin: Negative for color change and rash.  Neurological: Negative for dizziness, light-headedness and headaches.  Psychiatric/Behavioral: Negative for dysphoric mood. The patient is not nervous/anxious.        Objective:   Vitals:   01/01/20 0846  BP: 120/72  Pulse: (!) 57  Temp: 98 F (36.7 C)  SpO2: 98%   Filed Weights   01/01/20 0846  Weight: 148 lb 9.6 oz (67.4 kg)   Body mass index is 24.73 kg/m.  BP Readings from Last 3 Encounters:  01/01/20 120/72  01/09/19 125/73  12/24/18 136/80    Wt Readings from Last 3 Encounters:  01/01/20 148 lb 9.6 oz (67.4 kg)  01/09/19 146 lb 1.6 oz (66.3 kg)  12/24/18 146 lb 12.8 oz (66.6 kg)     Physical Exam Constitutional: She appears well-developed and well-nourished. No distress.  HENT:  Head: Normocephalic and atraumatic.  Right Ear: External ear normal. Normal ear canal and TM Left Ear: External ear normal.  Normal ear canal and TM Mouth/Throat: Oropharynx is clear and moist.  Eyes: Conjunctivae and EOM are normal.  Neck: Neck supple. No tracheal deviation present. No thyromegaly  present.  No carotid bruit  Cardiovascular: Normal rate, regular rhythm and normal heart sounds.   No murmur heard.  No edema. Pulmonary/Chest: Effort normal and breath sounds normal. No respiratory distress. She has no wheezes. She has no rales.  Breast: deferred   Abdominal: Soft. She exhibits no distension. There is no tenderness.  Lymphadenopathy: She has no cervical adenopathy.  Skin: Skin is warm and dry. She is not diaphoretic.  Psychiatric: She has a normal mood  and affect. Her behavior is normal.        Assessment & Plan:   Physical exam: Screening blood work    ordered Immunizations  Flu vac today, others up to date Colonoscopy  Up to date  Mammogram  Up to date  Gyn  Up to date  Dexa  Dexa due - ordered Eye exams   Up to date  Exercise  Walking 1 hr/ 6 days a week. Silver sneakers occ Weight  good Substance abuse  none Sees derm     See Problem List for Assessment and Plan of chronic medical problems.

## 2019-12-31 NOTE — Patient Instructions (Addendum)
Blood work was ordered.     Medications changes include :   none     Please followup in 1 year    Health Maintenance, Female Adopting a healthy lifestyle and getting preventive care are important in promoting health and wellness. Ask your health care provider about:  The right schedule for you to have regular tests and exams.  Things you can do on your own to prevent diseases and keep yourself healthy. What should I know about diet, weight, and exercise? Eat a healthy diet   Eat a diet that includes plenty of vegetables, fruits, low-fat dairy products, and lean protein.  Do not eat a lot of foods that are high in solid fats, added sugars, or sodium. Maintain a healthy weight Body mass index (BMI) is used to identify weight problems. It estimates body fat based on height and weight. Your health care provider can help determine your BMI and help you achieve or maintain a healthy weight. Get regular exercise Get regular exercise. This is one of the most important things you can do for your health. Most adults should:  Exercise for at least 150 minutes each week. The exercise should increase your heart rate and make you sweat (moderate-intensity exercise).  Do strengthening exercises at least twice a week. This is in addition to the moderate-intensity exercise.  Spend less time sitting. Even light physical activity can be beneficial. Watch cholesterol and blood lipids Have your blood tested for lipids and cholesterol at 69 years of age, then have this test every 5 years. Have your cholesterol levels checked more often if:  Your lipid or cholesterol levels are high.  You are older than 69 years of age.  You are at high risk for heart disease. What should I know about cancer screening? Depending on your health history and family history, you may need to have cancer screening at various ages. This may include screening for:  Breast cancer.  Cervical cancer.  Colorectal  cancer.  Skin cancer.  Lung cancer. What should I know about heart disease, diabetes, and high blood pressure? Blood pressure and heart disease  High blood pressure causes heart disease and increases the risk of stroke. This is more likely to develop in people who have high blood pressure readings, are of African descent, or are overweight.  Have your blood pressure checked: ? Every 3-5 years if you are 41-94 years of age. ? Every year if you are 23 years old or older. Diabetes Have regular diabetes screenings. This checks your fasting blood sugar level. Have the screening done:  Once every three years after age 20 if you are at a normal weight and have a low risk for diabetes.  More often and at a younger age if you are overweight or have a high risk for diabetes. What should I know about preventing infection? Hepatitis B If you have a higher risk for hepatitis B, you should be screened for this virus. Talk with your health care provider to find out if you are at risk for hepatitis B infection. Hepatitis C Testing is recommended for:  Everyone born from [birth year] through 1965.  Anyone with known risk factors for hepatitis C. Sexually transmitted infections (STIs)  Get screened for STIs, including gonorrhea and chlamydia, if: ? You are sexually active and are younger than 69 years of age. ? You are older than 69 years of age and your health care provider tells you that you are at risk for this type of infection. ?  Your sexual activity has changed since you were last screened, and you are at increased risk for chlamydia or gonorrhea. Ask your health care provider if you are at risk.  Ask your health care provider about whether you are at high risk for HIV. Your health care provider may recommend a prescription medicine to help prevent HIV infection. If you choose to take medicine to prevent HIV, you should first get tested for HIV. You should then be tested every 3 months for as long as  you are taking the medicine. Pregnancy  If you are about to stop having your period (premenopausal) and you may become pregnant, seek counseling before you get pregnant.  Take 400 to 800 micrograms (mcg) of folic acid every day if you become pregnant.  Ask for birth control (contraception) if you want to prevent pregnancy. Osteoporosis and menopause Osteoporosis is a disease in which the bones lose minerals and strength with aging. This can result in bone fractures. If you are 6 years old or older, or if you are at risk for osteoporosis and fractures, ask your health care provider if you should:  Be screened for bone loss.  Take a calcium or vitamin D supplement to lower your risk of fractures.  Be given hormone replacement therapy (HRT) to treat symptoms of menopause. Follow these instructions at home: Lifestyle  Do not use any products that contain nicotine or tobacco, such as cigarettes, e-cigarettes, and chewing tobacco. If you need help quitting, ask your health care provider.  Do not use street drugs.  Do not share needles.  Ask your health care provider for help if you need support or information about quitting drugs. Alcohol use  Do not drink alcohol if: ? Your health care provider tells you not to drink. ? You are pregnant, may be pregnant, or are planning to become pregnant.  If you drink alcohol: ? Limit how much you use to 0-1 drink a day. ? Limit intake if you are breastfeeding.  Be aware of how much alcohol is in your drink. In the [place] one drink equals one 12 oz bottle of beer (355 mL), one 5 oz glass of wine (148 mL), or one 1 oz glass of hard liquor (44 mL). General instructions  Schedule regular health, dental, and eye exams.  Stay current with your vaccines.  Tell your health care provider if: ? You often feel depressed. ? You have ever been abused or do not feel safe at home. Summary  Adopting a healthy lifestyle and getting preventive care are  important in promoting health and wellness.  Follow your health care provider's instructions about healthy diet, exercising, and getting tested or screened for diseases.  Follow your health care provider's instructions on monitoring your cholesterol and blood pressure. This information is not intended to replace advice given to you by your health care provider. Make sure you discuss any questions you have with your health care provider. Document Revised: 01/08/2018 Document Reviewed: 01/08/2018 Elsevier Patient Education  2020 [employer].

## 2020-01-01 ENCOUNTER — Other Ambulatory Visit (INDEPENDENT_AMBULATORY_CARE_PROVIDER_SITE_OTHER): Payer: PPO

## 2020-01-01 ENCOUNTER — Encounter: Payer: Self-pay | Admitting: Internal Medicine

## 2020-01-01 ENCOUNTER — Ambulatory Visit (INDEPENDENT_AMBULATORY_CARE_PROVIDER_SITE_OTHER): Payer: PPO | Admitting: Internal Medicine

## 2020-01-01 ENCOUNTER — Other Ambulatory Visit: Payer: Self-pay

## 2020-01-01 VITALS — BP 120/72 | HR 57 | Temp 98.0°F | Ht 65.0 in | Wt 148.6 lb

## 2020-01-01 DIAGNOSIS — Z0000 Encounter for general adult medical examination without abnormal findings: Secondary | ICD-10-CM

## 2020-01-01 DIAGNOSIS — M810 Age-related osteoporosis without current pathological fracture: Secondary | ICD-10-CM | POA: Diagnosis not present

## 2020-01-01 DIAGNOSIS — N3281 Overactive bladder: Secondary | ICD-10-CM | POA: Diagnosis not present

## 2020-01-01 DIAGNOSIS — Z853 Personal history of malignant neoplasm of breast: Secondary | ICD-10-CM | POA: Diagnosis not present

## 2020-01-01 DIAGNOSIS — Z23 Encounter for immunization: Secondary | ICD-10-CM | POA: Diagnosis not present

## 2020-01-01 LAB — CBC WITH DIFFERENTIAL/PLATELET
Basophils Absolute: 0 10*3/uL (ref 0.0–0.1)
Basophils Relative: 0.6 % (ref 0.0–3.0)
Eosinophils Absolute: 0.1 10*3/uL (ref 0.0–0.7)
Eosinophils Relative: 1.1 % (ref 0.0–5.0)
HCT: 40.2 % (ref 36.0–46.0)
Hemoglobin: 13.6 g/dL (ref 12.0–15.0)
Lymphocytes Relative: 28 % (ref 12.0–46.0)
Lymphs Abs: 1.4 10*3/uL (ref 0.7–4.0)
MCHC: 33.9 g/dL (ref 30.0–36.0)
MCV: 98.9 fl (ref 78.0–100.0)
Monocytes Absolute: 0.3 10*3/uL (ref 0.1–1.0)
Monocytes Relative: 6.4 % (ref 3.0–12.0)
Neutro Abs: 3.3 10*3/uL (ref 1.4–7.7)
Neutrophils Relative %: 63.9 % (ref 43.0–77.0)
Platelets: 194 10*3/uL (ref 150.0–400.0)
RBC: 4.06 Mil/uL (ref 3.87–5.11)
RDW: 13.8 % (ref 11.5–15.5)
WBC: 5.2 10*3/uL (ref 4.0–10.5)

## 2020-01-01 LAB — COMPREHENSIVE METABOLIC PANEL
ALT: 17 U/L (ref 0–35)
AST: 20 U/L (ref 0–37)
Albumin: 4.5 g/dL (ref 3.5–5.2)
Alkaline Phosphatase: 55 U/L (ref 39–117)
BUN: 15 mg/dL (ref 6–23)
CO2: 30 mEq/L (ref 19–32)
Calcium: 10.1 mg/dL (ref 8.4–10.5)
Chloride: 102 mEq/L (ref 96–112)
Creatinine, Ser: 0.77 mg/dL (ref 0.40–1.20)
GFR: 78.52 mL/min (ref >60.00)
Glucose, Bld: 97 mg/dL (ref 70–99)
Potassium: 4 mEq/L (ref 3.5–5.1)
Sodium: 140 mEq/L (ref 135–145)
Total Bilirubin: 0.8 mg/dL (ref 0.2–1.2)
Total Protein: 7 g/dL (ref 6.0–8.3)

## 2020-01-01 LAB — LIPID PANEL
Cholesterol: 209 mg/dL — ABNORMAL HIGH (ref 0–200)
HDL: 82.9 mg/dL (ref >39.00)
LDL Cholesterol: 114 mg/dL — ABNORMAL HIGH (ref 0–99)
NonHDL: 125.99
Total CHOL/HDL Ratio: 3
Triglycerides: 59 mg/dL (ref 0.0–149.0)
VLDL: 11.8 mg/dL (ref 0.0–40.0)

## 2020-01-01 LAB — HEMOGLOBIN A1C: Hgb A1c MFr Bld: 5.2 % (ref 4.6–6.5)

## 2020-01-01 LAB — TSH: TSH: 1.88 u[IU]/mL (ref 0.35–4.50)

## 2020-01-01 NOTE — Addendum Note (Signed)
Addended by: Marcina Millard on: 01/01/2020 03:40 PM   Modules accepted: Orders

## 2020-01-01 NOTE — Assessment & Plan Note (Signed)
Chronic Taking oxybutynin 10 mg XL daily She is unsure how much this helps-discussed that she can try stopping it more taking as needed

## 2020-01-01 NOTE — Assessment & Plan Note (Signed)
Chronic DEXA due-ordered Taking calcium and vitamin D daily Taking Fosamax weekly-started 01/2018 Check vitamin D level

## 2020-01-01 NOTE — Assessment & Plan Note (Signed)
Chronic Mammogram up-to-date No longer following with oncology Up-to-date with gynecology No evidence of recurrence

## 2020-01-05 ENCOUNTER — Other Ambulatory Visit: Payer: Self-pay

## 2020-01-05 ENCOUNTER — Ambulatory Visit (INDEPENDENT_AMBULATORY_CARE_PROVIDER_SITE_OTHER)
Admission: RE | Admit: 2020-01-05 | Discharge: 2020-01-05 | Disposition: A | Discharge status: Home / Self Care | Payer: PPO | Source: Ambulatory Visit | Attending: Internal Medicine | Admitting: Internal Medicine

## 2020-01-05 DIAGNOSIS — M810 Age-related osteoporosis without current pathological fracture: Secondary | ICD-10-CM | POA: Diagnosis not present

## 2020-03-21 ENCOUNTER — Telehealth: Payer: Self-pay | Admitting: Internal Medicine

## 2020-03-21 NOTE — Telephone Encounter (Signed)
LVM for pt to rtn my call to schedule awv with nha . Please schedule if pt calls the office.  

## 2020-04-01 ENCOUNTER — Ambulatory Visit (INDEPENDENT_AMBULATORY_CARE_PROVIDER_SITE_OTHER): Payer: PPO

## 2020-04-01 ENCOUNTER — Other Ambulatory Visit: Payer: Self-pay

## 2020-04-01 VITALS — BP 118/70 | HR 67 | Temp 98.3°F | Resp 16 | Ht 65.0 in | Wt 149.6 lb

## 2020-04-01 DIAGNOSIS — Z0000 Encounter for general adult medical examination without abnormal findings: Secondary | ICD-10-CM | POA: Diagnosis not present

## 2020-04-01 NOTE — Patient Instructions (Signed)
Sherry Wells , Thank you for taking time to come for your Medicare Wellness Visit. I appreciate your ongoing commitment to your health goals. Please review the following plan we discussed and let me know if I can assist you in the future.   Screening recommendations/referrals: Colonoscopy: 11/22/2011; due every 10 years Mammogram: 08/06/2019; due every 1-2 years Bone Density: 01/05/2020; due every 2 years Recommended yearly ophthalmology/optometry visit for glaucoma screening and checkup Recommended yearly dental visit for hygiene and checkup  Vaccinations: Influenza vaccine: 12/3/20201 Pneumococcal vaccine: 12/06/2016, 12/17/2017 Tdap vaccine: 10/17/2011 Shingles vaccine: 03/14/2018, 07/23/2018   Covid-19: 03/13/2019, 04/03/2019, 11/19/2019  Advanced directives: Advance directive discussed with you today. I have provided a copy for you to complete at home and have notarized. Once this is complete please bring a copy in to our office so we can scan it into your chart.  Conditions/risks identified: Yes; Reviewed health maintenance screenings with patient today and relevant education, vaccines, and/or referrals were provided. Please continue to do your personal lifestyle choices by: daily care of teeth and gums, regular physical activity (goal should be 5 days a week for 30 minutes), eat a healthy diet, avoid tobacco and drug use, limiting any alcohol intake, taking a low-dose aspirin (if not allergic or have been advised by your provider otherwise) and taking vitamins and minerals as recommended by your provider. Continue doing brain stimulating activities (puzzles, reading, adult coloring books, staying active) to keep memory sharp. Continue to eat heart healthy diet (full of fruits, vegetables, whole grains, lean protein, water--limit salt, fat, and sugar intake) and increase physical activity as tolerated.  Next appointment: Please schedule your next Medicare Wellness Visit with your Nurse Health Advisor  in 1 year by calling [phone].   Preventive Care 68 Years and Older, Female Preventive care refers to lifestyle choices and visits with your health care provider that can promote health and wellness. What does preventive care include?  A yearly physical exam. This is also called an annual well check.  Dental exams once or twice a year.  Routine eye exams. Ask your health care provider how often you should have your eyes checked.  Personal lifestyle choices, including:  Daily care of your teeth and gums.  Regular physical activity.  Eating a healthy diet.  Avoiding tobacco and drug use.  Limiting alcohol use.  Practicing safe sex.  Taking low-dose aspirin every day.  Taking vitamin and mineral supplements as recommended by your health care provider. What happens during an annual well check? The services and screenings done by your health care provider during your annual well check will depend on your age, overall health, lifestyle risk factors, and family history of disease. Counseling  Your health care provider may ask you questions about your:  Alcohol use.  Tobacco use.  Drug use.  Emotional well-being.  Home and relationship well-being.  Sexual activity.  Eating habits.  History of falls.  Memory and ability to understand (cognition).  Work and work Statistician.  Reproductive health. Screening  You may have the following tests or measurements:  Height, weight, and BMI.  Blood pressure.  Lipid and cholesterol levels. These may be checked every 5 years, or more frequently if you are over 35 years old.  Skin check.  Lung cancer screening. You may have this screening every year starting at age 4 if you have a 30-pack-year history of smoking and currently smoke or have quit within the past 15 years.  Fecal occult blood test (FOBT) of the stool.  You may have this test every year starting at age 67.  Flexible sigmoidoscopy or colonoscopy. You  may have a sigmoidoscopy every 5 years or a colonoscopy every 10 years starting at age 38.  Hepatitis C blood test.  Hepatitis B blood test.  Sexually transmitted disease (STD) testing.  Diabetes screening. This is done by checking your blood sugar (glucose) after you have not eaten for a while (fasting). You may have this done every 1-3 years.  Bone density scan. This is done to screen for osteoporosis. You may have this done starting at age 71.  Mammogram. This may be done every 1-2 years. Talk to your health care provider about how often you should have regular mammograms. Talk with your health care provider about your test results, treatment options, and if necessary, the need for more tests. Vaccines  Your health care provider may recommend certain vaccines, such as:  Influenza vaccine. This is recommended every year.  Tetanus, diphtheria, and acellular pertussis (Tdap, Td) vaccine. You may need a Td booster every 10 years.  Zoster vaccine. You may need this after age 58.  Pneumococcal 13-valent conjugate (PCV13) vaccine. One dose is recommended after age 29.  Pneumococcal polysaccharide (PPSV23) vaccine. One dose is recommended after age 70. Talk to your health care provider about which screenings and vaccines you need and how often you need them. This information is not intended to replace advice given to you by your health care provider. Make sure you discuss any questions you have with your health care provider. Document Released: 02/11/2015 Document Revised: 10/05/2015 Document Reviewed: 11/16/2014 Elsevier Interactive Patient Education  2017 [employer] Prevention in the Home Falls can cause injuries. They can happen to people of all ages. There are many things you can do to make your home safe and to help prevent falls. What can I do on the outside of my home?  Regularly fix the edges of walkways and driveways and fix any cracks.  Remove anything that might  make you trip as you walk through a door, such as a raised step or threshold.  Trim any bushes or trees on the path to your home.  Use bright outdoor lighting.  Clear any walking paths of anything that might make someone trip, such as rocks or tools.  Regularly check to see if handrails are loose or broken. Make sure that both sides of any steps have handrails.  Any raised decks and porches should have guardrails on the edges.  Have any leaves, snow, or ice cleared regularly.  Use sand or salt on walking paths during winter.  Clean up any spills in your garage right away. This includes oil or grease spills. What can I do in the bathroom?  Use night lights.  Install grab bars by the toilet and in the tub and shower. Do not use towel bars as grab bars.  Use non-skid mats or decals in the tub or shower.  If you need to sit down in the shower, use a plastic, non-slip stool.  Keep the floor dry. Clean up any water that spills on the floor as soon as it happens.  Remove soap buildup in the tub or shower regularly.  Attach bath mats securely with double-sided non-slip rug tape.  Do not have throw rugs and other things on the floor that can make you trip. What can I do in the bedroom?  Use night lights.  Make sure that you have a light by your bed that  is easy to reach.  Do not use any sheets or blankets that are too big for your bed. They should not hang down onto the floor.  Have a firm chair that has side arms. You can use this for support while you get dressed.  Do not have throw rugs and other things on the floor that can make you trip. What can I do in the kitchen?  Clean up any spills right away.  Avoid walking on wet floors.  Keep items that you use a lot in easy-to-reach places.  If you need to reach something above you, use a strong step stool that has a grab bar.  Keep electrical cords out of the way.  Do not use floor polish or wax that makes floors  slippery. If you must use wax, use non-skid floor wax.  Do not have throw rugs and other things on the floor that can make you trip. What can I do with my stairs?  Do not leave any items on the stairs.  Make sure that there are handrails on both sides of the stairs and use them. Fix handrails that are broken or loose. Make sure that handrails are as long as the stairways.  Check any carpeting to make sure that it is firmly attached to the stairs. Fix any carpet that is loose or worn.  Avoid having throw rugs at the top or bottom of the stairs. If you do have throw rugs, attach them to the floor with carpet tape.  Make sure that you have a light switch at the top of the stairs and the bottom of the stairs. If you do not have them, ask someone to add them for you. What else can I do to help prevent falls?  Wear shoes that:  Do not have high heels.  Have rubber bottoms.  Are comfortable and fit you well.  Are closed at the toe. Do not wear sandals.  If you use a stepladder:  Make sure that it is fully opened. Do not climb a closed stepladder.  Make sure that both sides of the stepladder are locked into place.  Ask someone to hold it for you, if possible.  Clearly mark and make sure that you can see:  Any grab bars or handrails.  First and last steps.  Where the edge of each step is.  Use tools that help you move around (mobility aids) if they are needed. These include:  Canes.  Walkers.  Scooters.  Crutches.  Turn on the lights when you go into a dark area. Replace any light bulbs as soon as they burn out.  Set up your furniture so you have a clear path. Avoid moving your furniture around.  If any of your floors are uneven, fix them.  If there are any pets around you, be aware of where they are.  Review your medicines with your doctor. Some medicines can make you feel dizzy. This can increase your chance of falling. Ask your doctor what other things that you  can do to help prevent falls. This information is not intended to replace advice given to you by your health care provider. Make sure you discuss any questions you have with your health care provider. Document Released: 11/11/2008 Document Revised: 06/23/2015 Document Reviewed: 02/19/2014 Elsevier Interactive Patient Education  2017 [employer].

## 2020-04-01 NOTE — Progress Notes (Signed)
Subjective:   Sherry Wells is a 70 y.o. female who presents for Medicare Annual (Subsequent) preventive examination.  Review of Systems    No ROS. Medicare Wellness Visit. Additional risk factors are reflected in social history. Cardiac Risk Factors include: advanced age (>77men, >109 women);dyslipidemia;family history of premature cardiovascular disease     Objective:    Today's Vitals   04/01/20 1102  BP: 118/70  Pulse: 67  Resp: 16  Temp: 98.3 F (36.8 C)  SpO2: 98%  Weight: 149 lb 9.6 oz (67.9 kg)  Height: 5\' 5"  (1.651 m)   Body mass index is 24.89 kg/m.  Advanced Directives 04/01/2020 12/17/2017 07/28/2014 11/20/2013 07/29/2013 07/06/2013  Does Patient Have a Medical Advance Directive? No No No No Patient does not have advance directive;Patient would not like information Patient does not have advance directive;Patient would not like information  Would patient like information on creating a medical advance directive? Yes (MAU/Ambulatory/Procedural Areas - Information given) No - Patient declined No - patient declined information No - patient declined information - -    Current Medications (verified) Outpatient Encounter Medications as of 04/01/2020  Medication Sig  . alendronate (FOSAMAX) 70 MG tablet TAKE 1 TABLET BY MOUTH EVERY 7 DAYS. TAKE WITH A FULL GLASS OF WATER ON AN EMPTY STOMACH  . Calcium Carbonate-Vitamin D 600-400 MG-UNIT tablet Take 1 tablet by mouth 2 (two) times daily.  . Multiple Vitamins-Minerals (MULTIVITAMIN WITH MINERALS) tablet Take 1 tablet by mouth daily.  Marland Kitchen SHINGRIX injection   . vitamin C (ASCORBIC ACID) 500 MG tablet Take 500 mg by mouth daily.  . [DISCONTINUED] oxybutynin (DITROPAN-XL) 10 MG 24 hr tablet Take 1 tablet (10 mg total) by mouth daily.   No facility-administered encounter medications on file as of 04/01/2020.    Allergies (verified) Patient has no known allergies.   History: Past Medical History:  Diagnosis Date  . Breast  cancer ([place]) 06/2013   left UOQ  . Fibrocystic breast disease   . Hyperlipidemia   . Osteoporosis   . Personal history of radiation therapy   . PONV (postoperative nausea and vomiting)   . S/P radiation therapy 09/08/2013-10/07/2013    1) Left breast / 40.05Gy in 15 fractions/ 2) Left Breast Boost / 10 Gy in 5 fractions   Past Surgical History:  Procedure Laterality Date  . BREAST BIOPSY Left 2015  . BREAST CYST ASPIRATION Left 1980  . BREAST EXCISIONAL BIOPSY Left 1985  . BREAST LUMPECTOMY  1980   cyst  . BREAST LUMPECTOMY Left 07/13/2013  . COLONOSCOPY  2002 & 2013   negative; [facility]  . DILATION AND CURETTAGE OF UTERUS     x2  . TUBAL LIGATION  1994   Family History  Problem Relation Age of Onset  . Diabetes Mother   . Transient ischemic attack Mother        in 80s  . Hypertension Mother   . Valvular heart disease Father        Aortic valve  . Osteoporosis Sister   . Cardiomyopathy Sister   . Throat cancer Maternal Uncle   . Cancer Maternal Uncle        ? primary  . Valvular heart disease Paternal Uncle        Aortic valve   Social History   Socioeconomic History  . Marital status: Married    Spouse name: Not on file  . Number of children: Not on file  . Years of education: Not on file  .  Highest education level: Not on file  Occupational History  . Occupation: retired  Tobacco Use  . Smoking status: Never Smoker  . Smokeless tobacco: Never Used  Vaping Use  . Vaping Use: Never used  Substance and Sexual Activity  . Alcohol use: No  . Drug use: No  . Sexual activity: Yes    Comment: meanarche 40, G0, menopause age 69, no HRT  Other Topics Concern  . Not on file  Social History Narrative  . Not on file   Social Determinants of Health   Financial Resource Strain: Low Risk   . Difficulty of Paying Living Expenses: Not hard at all  Food Insecurity: No Food Insecurity  . Worried About Charity fundraiser in the Last Year: Never true  . Ran Out of  Food in the Last Year: Never true  Transportation Needs: No Transportation Needs  . Lack of Transportation (Medical): No  . Lack of Transportation (Non-Medical): No  Physical Activity: Sufficiently Active  . Days of Exercise per Week: 5 days  . Minutes of Exercise per Session: 30 min  Stress: No Stress Concern Present  . Feeling of Stress : Not at all  Social Connections: Socially Integrated  . Frequency of Communication with Friends and Family: More than three times a week  . Frequency of Social Gatherings with Friends and Family: More than three times a week  . Attends Religious Services: More than 4 times per year  . Active Member of Clubs or Organizations: Yes  . Attends Archivist Meetings: More than 4 times per year  . Marital Status: Married    Tobacco Counseling Counseling given: Not Answered   Clinical Intake:  Pre-visit preparation completed: Yes  Pain : No/denies pain     BMI - recorded: 24.89 Nutritional Status: BMI 25 -29 Overweight Nutritional Risks: None Diabetes: No  How often do you need to have someone help you when you read instructions, pamphlets, or other written materials from your doctor or pharmacy?: 1 - Never What is the last grade level you completed in school?: High School Graduate  Diabetic? no  Interpreter Needed?: No  Information entered by :: Lisette Abu, LPN   Activities of Daily Living In your present state of health, do you have any difficulty performing the following activities: 04/01/2020 01/01/2020  Hearing? N N  Vision? N N  Difficulty concentrating or making decisions? N N  Walking or climbing stairs? N N  Dressing or bathing? N N  Doing errands, shopping? N N  Preparing Food and eating ? N -  Using the Toilet? N -  In the past six months, have you accidently leaked urine? N -  Do you have problems with loss of bowel control? N -  Managing your Medications? N -  Managing your Finances? N -  Housekeeping or  managing your Housekeeping? N -  Some recent data might be hidden    Patient Care Team: Binnie Rail, MD as PCP - General (Internal Medicine)  Indicate any recent Medical Services you may have received from other than [facility] providers in the past year (date may be approximate).     Assessment:   This is a routine wellness examination for [place].  Hearing/Vision screen No exam data present  Dietary issues and exercise activities discussed: Current Exercise Habits: Home exercise routine, Type of exercise: treadmill;walking, Time (Minutes): 30, Frequency (Times/Week): 5, Weekly Exercise (Minutes/Week): 150, Exercise limited by: None identified  Goals    . Patient  Stated     Maintain current health status and enjoy life.      Depression Screen PHQ 2/9 Scores 04/01/2020 01/01/2020 02/17/2018 12/17/2017 12/06/2016 12/05/2015 11/20/2013  PHQ - 2 Score 0 0 0 0 0 0 0  PHQ- 9 Score - - - 0 - - -    Fall Risk Fall Risk  04/01/2020 01/01/2020 12/24/2018 02/17/2018 12/17/2017  Falls in the past year? 0 0 0 0 0  Number falls in past yr: 0 0 0 - -  Injury with Fall? 0 0 - - -  Risk for fall due to : No Fall Risks No Fall Risks - - -  Follow up Falls evaluation completed Falls evaluation completed - - -    FALL RISK PREVENTION PERTAINING TO THE HOME:  Any stairs in or around the home? No  If so, are there any without handrails? No  Home free of loose throw rugs in walkways, pet beds, electrical cords, etc? Yes  Adequate lighting in your home to reduce risk of falls? Yes   ASSISTIVE DEVICES UTILIZED TO PREVENT FALLS:  Life alert? No  Use of a cane, walker or w/c? No  Grab bars in the bathroom? Yes  Shower chair or bench in shower? Yes  Elevated toilet seat or a handicapped toilet? Yes   TIMED UP AND GO:  Was the test performed? No .  Length of time to ambulate 10 feet: 0 sec.   Gait steady and fast without use of assistive device  Cognitive Function: Normal cognitive status assessed  by direct observation by this Nurse Health Advisor. No abnormalities found.          Immunizations Immunization History  Administered Date(s) Administered  . Fluad Quad(high Dose 65+) 12/24/2018, 01/01/2020  . Influenza Whole 10/29/2009, 11/01/2011  . Influenza, High Dose Seasonal PF 12/06/2016, 12/17/2017  . Influenza,inj,Quad PF,6+ Mos 11/19/2012  . Influenza-Unspecified 10/28/2013, 11/23/2014, 11/23/2015  . PFIZER(Purple Top)SARS-COV-2 Vaccination 03/13/2019, 04/03/2019, 11/19/2019  . Pneumococcal Conjugate-13 12/06/2016  . Pneumococcal Polysaccharide-23 12/17/2017  . Td 03/09/2003  . Tdap 10/17/2011  . Zoster 01/09/2016  . Zoster Recombinat (Shingrix) 03/14/2018, 07/23/2018    TDAP status: Up to date  Flu Vaccine status: Up to date  Pneumococcal vaccine status: Up to date  Covid-19 vaccine status: Completed vaccines  Qualifies for Shingles Vaccine? Yes   Zostavax completed Yes   Shingrix Completed?: Yes  Screening Tests Health Maintenance  Topic Date Due  . COVID-19 Vaccine (4 - Booster for Pfizer series) 05/19/2020  . MAMMOGRAM  08/05/2020  . TETANUS/TDAP  10/16/2021  . COLONOSCOPY (Pts 45-13yrs Insurance coverage will need to be confirmed)  11/21/2021  . DEXA SCAN  01/04/2022  . INFLUENZA VACCINE  Completed  . Hepatitis C Screening  Completed  . PNA vac Low Risk Adult  Completed  . HPV VACCINES  Aged Out    Health Maintenance  There are no preventive care reminders to display for this patient.  Colorectal cancer screening: Type of screening: Colonoscopy. Completed 11/22/2011. Repeat every 10 years  Mammogram status: Completed 7/8/20201. Repeat every year  Bone Density status: Completed 01/05/2020. Results reflect: Bone density results: OSTEOPOROSIS. Repeat every 2 years.  Lung Cancer Screening: (Low Dose CT Chest recommended if Age 73-80 years, 30 pack-year currently smoking OR have quit w/in 15years.) does not qualify.   Lung Cancer Screening  Referral: no  Additional Screening:  Hepatitis C Screening: does qualify; Completed yesa  Vision Screening: Recommended annual ophthalmology exams for early detection of glaucoma and  other disorders of the eye. Is the patient up to date with their annual eye exam?  Yes  Who is the provider or what is the name of the office in which the patient attends annual eye exams? Marygrace Drought, MD. If pt is not established with a provider, would they like to be referred to a provider to establish care? No .   Dental Screening: Recommended annual dental exams for proper oral hygiene  Community Resource Referral / Chronic Care Management: CRR required this visit?  No   CCM required this visit?  No      Plan:     I have personally reviewed and noted the following in the patient's chart:   . Medical and social history . Use of alcohol, tobacco or illicit drugs  . Current medications and supplements . Functional ability and status . Nutritional status . Physical activity . Advanced directives . List of other physicians . Hospitalizations, surgeries, and ER visits in previous 12 months . Vitals . Screenings to include cognitive, depression, and falls . Referrals and appointments  In addition, I have reviewed and discussed with patient certain preventive protocols, quality metrics, and best practice recommendations. A written personalized care plan for preventive services as well as general preventive health recommendations were provided to patient.     Sheral Flow, LPN   3/0/1601   Nurse Notes:  Medications reviewed with patient; no opioid use noted.

## 2020-05-21 IMAGING — US US BREAST*R* LIMITED INC AXILLA
1 series · 8 of 8 positions shown · non-contrast
Comparison: 07/09/2017 and earlier

CLINICAL DATA: LEFT lumpectomy in 9541.

EXAM:
DIGITAL DIAGNOSTIC BILATERAL MAMMOGRAM WITH CAD AND TOMO
ULTRASOUND RIGHT BREAST

[Series 1: us breast*right* limited inc axilla · 0.06mm/px · 8 of 8 slices shown]
[im 1/8]
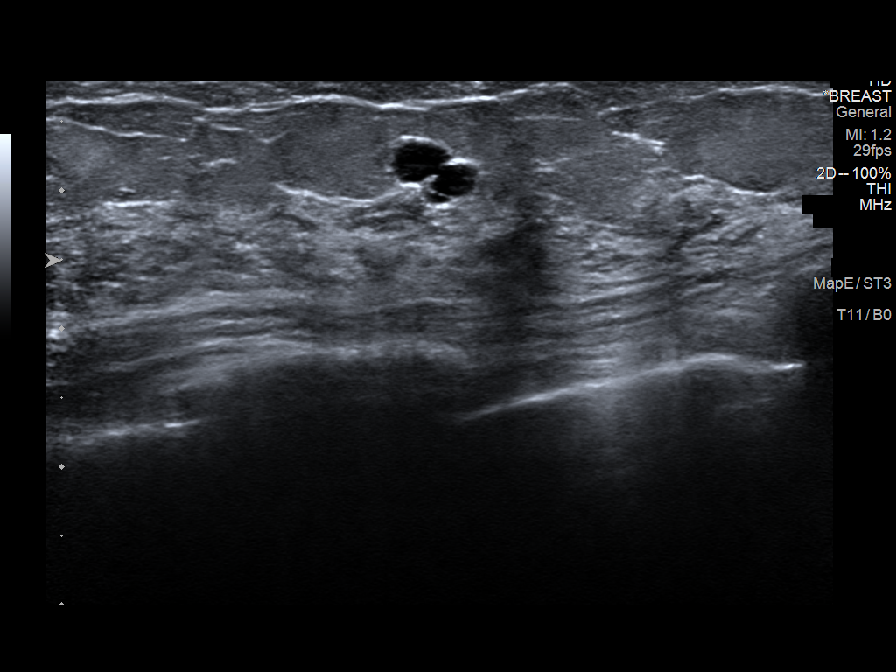
[im 2/8]
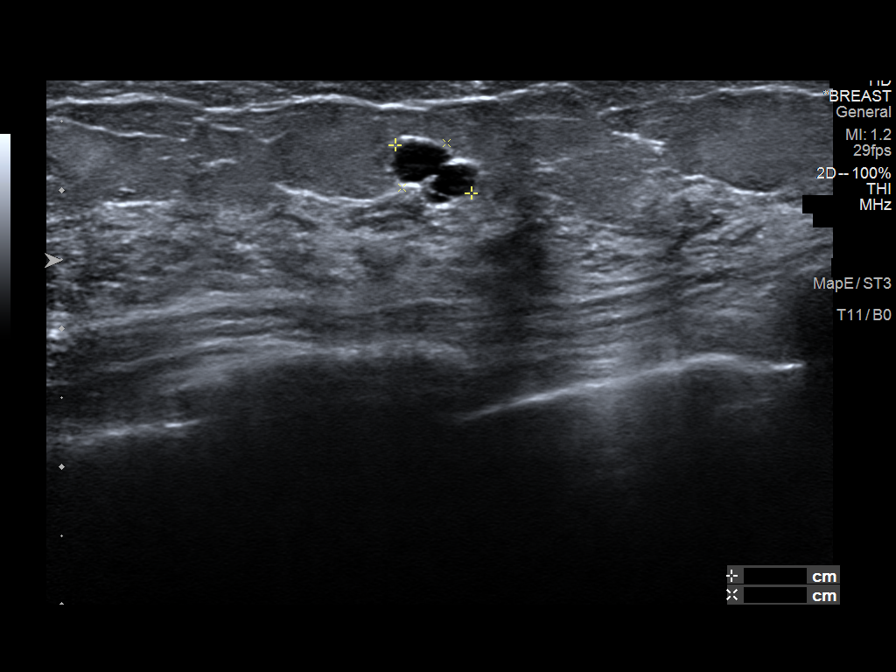
[im 3/8]
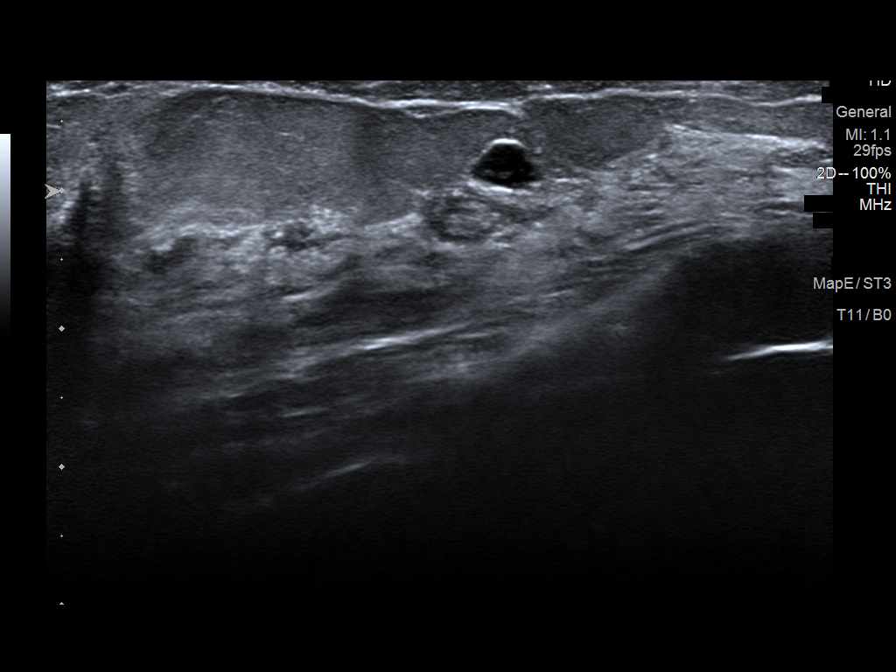
[im 4/8]
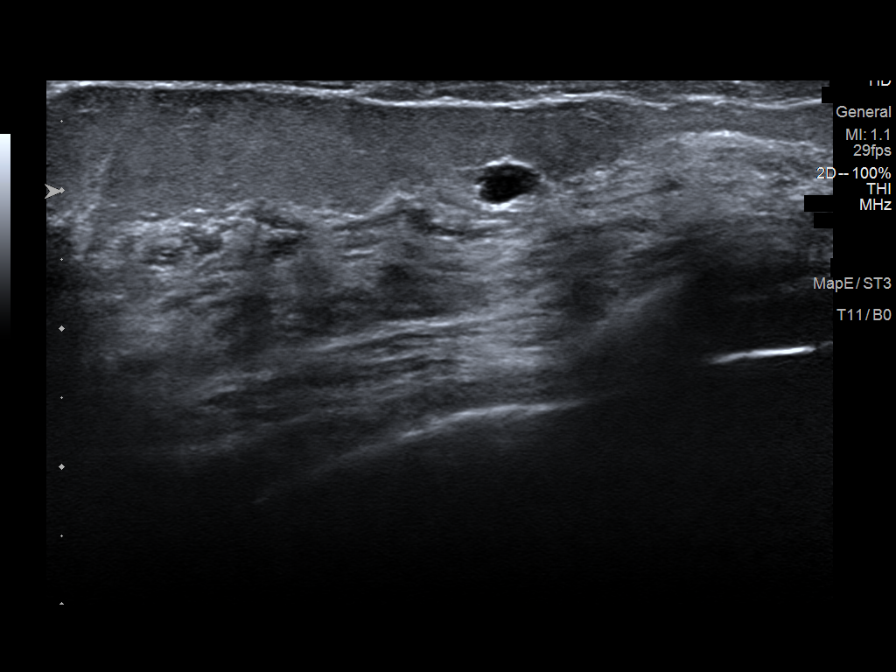
[im 5/8]
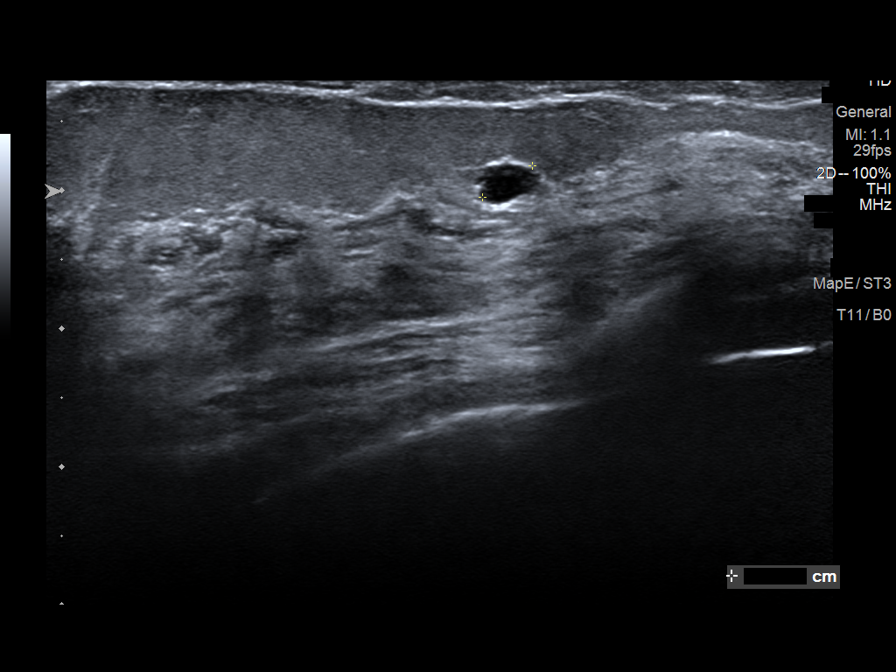
[im 6/8]
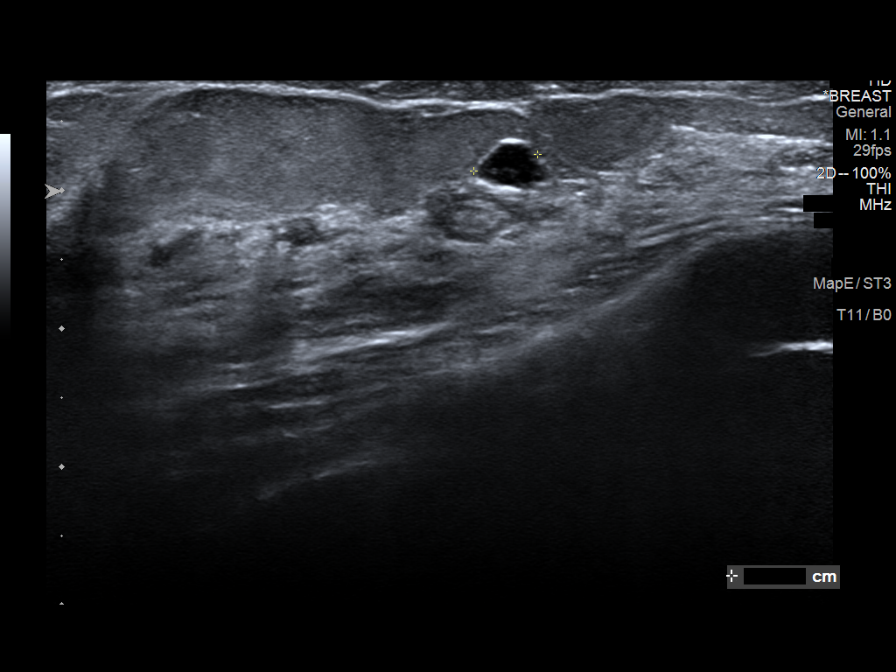
[im 7/8]
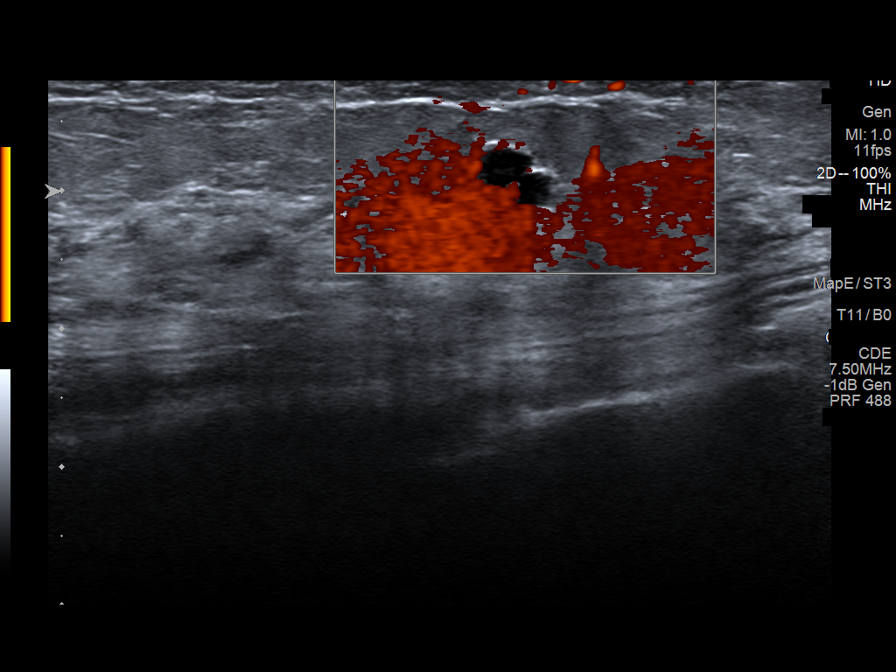
[im 8/8]
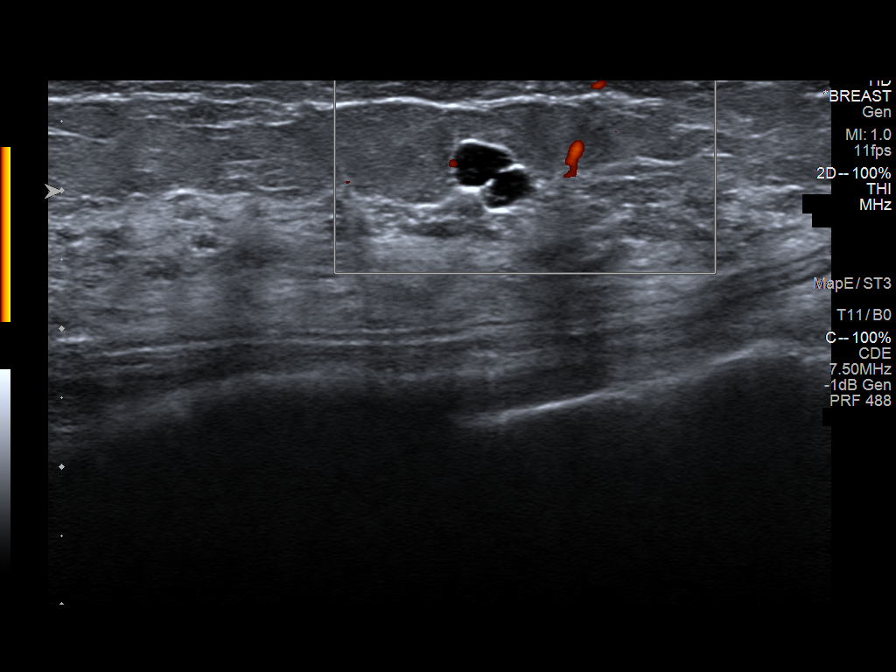

[8 of 8 positions shown; findings below may reference images not displayed]

ACR Breast Density Category c: The breast tissue is heterogeneously
dense, which may obscure small masses.
FINDINGS: Postoperative changes are identified in the LEFT breast. Within the
MEDIAL aspect of the RIGHT breast there is a circumscribed oval mass
further evaluated with ultrasound. No suspicious microcalcifications
or non surgical distortion identified in either breast.

Mammographic images were processed with CAD.

On physical exam, I palpate no abnormality in the MEDIAL aspect of
the RIGHT breast.

Targeted ultrasound is performed, showing a circumscribed parallel
oval anechoic mass with single internal septation in the 9 o'clock
location of the RIGHT breast 2 centimeters from the nipple which
measures 0.7 x 0.5 x 0.4 centimeters. There is no associated solid
component or internal blood flow.
IMPRESSION: Small cyst in the 9 o'clock location of the RIGHT breast.

No mammographic or ultrasound evidence for malignancy.

RECOMMENDATION:
Screening mammogram in one year.(Code:[id number])

I have discussed the findings and recommendations with the patient.
Results were also provided in writing at the conclusion of the
visit. If applicable, a reminder letter will be sent to the patient
regarding the next appointment.

BI-RADS CATEGORY  2: Benign.

## 2020-06-28 ENCOUNTER — Other Ambulatory Visit: Payer: Self-pay | Admitting: Internal Medicine

## 2020-06-28 DIAGNOSIS — Z1231 Encounter for screening mammogram for malignant neoplasm of breast: Secondary | ICD-10-CM

## 2020-08-08 DIAGNOSIS — D485 Neoplasm of uncertain behavior of skin: Secondary | ICD-10-CM | POA: Diagnosis not present

## 2020-08-08 DIAGNOSIS — Z1283 Encounter for screening for malignant neoplasm of skin: Secondary | ICD-10-CM | POA: Diagnosis not present

## 2020-08-08 DIAGNOSIS — D225 Melanocytic nevi of trunk: Secondary | ICD-10-CM | POA: Diagnosis not present

## 2020-08-08 DIAGNOSIS — D2271 Melanocytic nevi of right lower limb, including hip: Secondary | ICD-10-CM | POA: Diagnosis not present

## 2020-08-23 ENCOUNTER — Ambulatory Visit
Admission: RE | Admit: 2020-08-23 | Discharge: 2020-08-23 | Disposition: A | Discharge status: Home / Self Care | Payer: PPO | Source: Ambulatory Visit | Attending: Internal Medicine | Admitting: Internal Medicine

## 2020-08-23 ENCOUNTER — Other Ambulatory Visit: Payer: Self-pay

## 2020-08-23 DIAGNOSIS — Z1231 Encounter for screening mammogram for malignant neoplasm of breast: Secondary | ICD-10-CM | POA: Diagnosis not present

## 2020-09-02 DIAGNOSIS — H2513 Age-related nuclear cataract, bilateral: Secondary | ICD-10-CM | POA: Diagnosis not present

## 2020-09-02 DIAGNOSIS — H353131 Nonexudative age-related macular degeneration, bilateral, early dry stage: Secondary | ICD-10-CM | POA: Diagnosis not present

## 2020-09-02 DIAGNOSIS — H524 Presbyopia: Secondary | ICD-10-CM | POA: Diagnosis not present

## 2020-09-02 DIAGNOSIS — H43813 Vitreous degeneration, bilateral: Secondary | ICD-10-CM | POA: Diagnosis not present

## 2020-09-07 ENCOUNTER — Ambulatory Visit
Admission: EM | Admit: 2020-09-07 | Discharge: 2020-09-07 | Disposition: A | Discharge status: Home / Self Care | Payer: PPO | Attending: Family Medicine | Admitting: Family Medicine

## 2020-09-07 ENCOUNTER — Other Ambulatory Visit: Payer: Self-pay

## 2020-09-07 ENCOUNTER — Encounter: Payer: Self-pay | Admitting: Emergency Medicine

## 2020-09-07 DIAGNOSIS — R059 Cough, unspecified: Secondary | ICD-10-CM

## 2020-09-07 DIAGNOSIS — J069 Acute upper respiratory infection, unspecified: Secondary | ICD-10-CM

## 2020-09-07 DIAGNOSIS — J029 Acute pharyngitis, unspecified: Secondary | ICD-10-CM

## 2020-09-07 NOTE — Discharge Instructions (Addendum)
You may use over the counter ibuprofen or acetaminophen as needed.  For a sore throat, over the counter products such as [employer] Peroxyl Mouth Sore Rinse or Chloraseptic Sore Throat Spray may provide some temporary relief.  You have been tested for COVID-19 today. If your test returns positive, you will receive a phone call from [facility] regarding your results. Negative test results are not called. Both positive and negative results area always visible on MyChart. If you do not have a MyChart account, sign up instructions are provided in your discharge papers. Please do not hesitate to contact us should you have questions or concerns.

## 2020-09-07 NOTE — ED Triage Notes (Signed)
Sore throat, cough and fatigue for the past few day.  Neg at home covid test.

## 2020-09-07 NOTE — ED Provider Notes (Addendum)
[place]   [id number] 09/07/20 Arrival Time: [id number]  ASSESSMENT & PLAN:  1. Cough   2. Sore throat   3. Viral URI with cough    No signs of strep throat. All symptoms appear viral in nature. Discussed typical duration of viral illnesses. COVID-19 testing sent. OTC symptom care as needed.  Labs Reviewed  NOVEL CORONAVIRUS, NAA     Follow-up Information     Binnie Rail, MD.   Specialty: Internal Medicine Why: If worsening or failing to improve as anticipated. Contact information: [place] [address] [phone]                 Reviewed expectations re: course of current medical issues. Questions answered. Outlined signs and symptoms indicating need for more acute intervention. Understanding verbalized. After Visit Summary given.   SUBJECTIVE: History from: patient. Sherry Wells is a 70 y.o. female who presents with worries regarding COVID-19. Known COVID-19 contact: none. Recent travel: none. Reports: ST, dry cough, fatigue; x few days; abrupt onset. Denies: fever and difficulty breathing. Normal PO intake without n/v/d.   OBJECTIVE:  Vitals:   09/07/20 0857  BP: (!) 152/81  Pulse: 72  Resp: 17  Temp: 98.4 F (36.9 C)  TempSrc: Oral  SpO2: 99%    General appearance: alert; no distress Eyes: PERRLA; EOMI; conjunctiva normal HENT: [place]; AT; with mild nasal congestion; throat irritation; normal tonsils Neck: supple s LAD Lungs: speaks full sentences without difficulty; unlabored Extremities: no edema Skin: warm and dry Neurologic: normal gait Psychological: alert and cooperative; normal mood and affect  Labs:  Labs Reviewed  NOVEL CORONAVIRUS, NAA   No Known Allergies  Past Medical History:  Diagnosis Date   Breast cancer ([place]) 06/2013   left UOQ   Fibrocystic breast disease    Hyperlipidemia    Osteoporosis    Personal history of radiation therapy    PONV (postoperative nausea and vomiting)     S/P radiation therapy 09/08/2013-10/07/2013    1) Left breast / 40.05Gy in 15 fractions/ 2) Left Breast Boost / 10 Gy in 5 fractions   Social History   Socioeconomic History   Marital status: Married    Spouse name: Not on file   Number of children: Not on file   Years of education: Not on file   Highest education level: Not on file  Occupational History   Occupation: retired  Tobacco Use   Smoking status: Never   Smokeless tobacco: Never  Vaping Use   Vaping Use: Never used  Substance and Sexual Activity   Alcohol use: No   Drug use: No   Sexual activity: Yes    Comment: meanarche 48, G68, menopause age 18, no HRT  Other Topics Concern   Not on file  Social History Narrative   Not on file   Social Determinants of Health   Financial Resource Strain: Low Risk    Difficulty of Paying Living Expenses: Not hard at all  Food Insecurity: No Food Insecurity   Worried About Charity fundraiser in the Last Year: Never true   [place] in the Last Year: Never true  Transportation Needs: No Transportation Needs   Lack of Transportation (Medical): No   Lack of Transportation (Non-Medical): No  Physical Activity: Sufficiently Active   Days of Exercise per Week: 5 days   Minutes of Exercise per Session: 30 min  Stress: No Stress Concern Present   Feeling of Stress :  Not at all  Social Connections: Socially Integrated   Frequency of Communication with Friends and Family: More than three times a week   Frequency of Social Gatherings with Friends and Family: More than three times a week   Attends Religious Services: More than 4 times per year   Active Member of [employer] or Organizations: Yes   Attends Music therapist: More than 4 times per year   Marital Status: Married  Human resources officer Violence: Not on file   Family History  Problem Relation Age of Onset   Diabetes Mother    Transient ischemic attack Mother        in 20s   Hypertension Mother    Valvular  heart disease Father        Aortic valve   Osteoporosis Sister    Cardiomyopathy Sister    Throat cancer Maternal Uncle    Cancer Maternal Uncle        ? primary   Valvular heart disease Paternal Uncle        Aortic valve   Past Surgical History:  Procedure Laterality Date   BREAST BIOPSY Left 2015   BREAST CYST ASPIRATION Left 1980   BREAST EXCISIONAL BIOPSY Left 1985   BREAST LUMPECTOMY  1980   cyst   BREAST LUMPECTOMY Left 07/13/2013   COLONOSCOPY  2002 & 2013   negative; [facility]   DILATION AND CURETTAGE OF UTERUS     x2   TUBAL LIGATION  1994     Vanessa Kick, MD 09/07/20 [id number]    Vanessa Kick, MD 09/07/20 [phone]

## 2020-09-08 LAB — SARS-COV-2, NAA 2 DAY TAT

## 2020-09-08 LAB — NOVEL CORONAVIRUS, NAA: SARS-CoV-2, NAA: NOT DETECTED

## 2020-09-09 ENCOUNTER — Ambulatory Visit: Payer: PPO | Admitting: Internal Medicine

## 2020-10-28 ENCOUNTER — Other Ambulatory Visit: Payer: Self-pay | Admitting: Internal Medicine

## 2021-01-02 NOTE — Patient Instructions (Addendum)
Blood work was ordered.     Medications changes include :   None    Please followup in 1 year   Health Maintenance, Female Adopting a healthy lifestyle and getting preventive care are important in promoting health and wellness. Ask your health care provider about: The right schedule for you to have regular tests and exams. Things you can do on your own to prevent diseases and keep yourself healthy. What should I know about diet, weight, and exercise? Eat a healthy diet  Eat a diet that includes plenty of vegetables, fruits, low-fat dairy products, and lean protein. Do not eat a lot of foods that are high in solid fats, added sugars, or sodium. Maintain a healthy weight Body mass index (BMI) is used to identify weight problems. It estimates body fat based on height and weight. Your health care provider can help determine your BMI and help you achieve or maintain a healthy weight. Get regular exercise Get regular exercise. This is one of the most important things you can do for your health. Most adults should: Exercise for at least 150 minutes each week. The exercise should increase your heart rate and make you sweat (moderate-intensity exercise). Do strengthening exercises at least twice a week. This is in addition to the moderate-intensity exercise. Spend less time sitting. Even light physical activity can be beneficial. Watch cholesterol and blood lipids Have your blood tested for lipids and cholesterol at 70 years of age, then have this test every 5 years. Have your cholesterol levels checked more often if: Your lipid or cholesterol levels are high. You are older than 70 years of age. You are at high risk for heart disease. What should I know about cancer screening? Depending on your health history and family history, you may need to have cancer screening at various ages. This may include screening for: Breast cancer. Cervical cancer. Colorectal cancer. Skin cancer. Lung  cancer. What should I know about heart disease, diabetes, and high blood pressure? Blood pressure and heart disease High blood pressure causes heart disease and increases the risk of stroke. This is more likely to develop in people who have high blood pressure readings or are overweight. Have your blood pressure checked: Every 3-5 years if you are 52-47 years of age. Every year if you are 40 years old or older. Diabetes Have regular diabetes screenings. This checks your fasting blood sugar level. Have the screening done: Once every three years after age 59 if you are at a normal weight and have a low risk for diabetes. More often and at a younger age if you are overweight or have a high risk for diabetes. What should I know about preventing infection? Hepatitis B If you have a higher risk for hepatitis B, you should be screened for this virus. Talk with your health care provider to find out if you are at risk for hepatitis B infection. Hepatitis C Testing is recommended for: Everyone born from [birth year] through 1965. Anyone with known risk factors for hepatitis C. Sexually transmitted infections (STIs) Get screened for STIs, including gonorrhea and chlamydia, if: You are sexually active and are younger than 70 years of age. You are older than 70 years of age and your health care provider tells you that you are at risk for this type of infection. Your sexual activity has changed since you were last screened, and you are at increased risk for chlamydia or gonorrhea. Ask your health care provider if you are at risk. Ask your  health care provider about whether you are at high risk for HIV. Your health care provider may recommend a prescription medicine to help prevent HIV infection. If you choose to take medicine to prevent HIV, you should first get tested for HIV. You should then be tested every 3 months for as long as you are taking the medicine. Pregnancy If you are about to stop having your  period (premenopausal) and you may become pregnant, seek counseling before you get pregnant. Take 400 to 800 micrograms (mcg) of folic acid every day if you become pregnant. Ask for birth control (contraception) if you want to prevent pregnancy. Osteoporosis and menopause Osteoporosis is a disease in which the bones lose minerals and strength with aging. This can result in bone fractures. If you are 59 years old or older, or if you are at risk for osteoporosis and fractures, ask your health care provider if you should: Be screened for bone loss. Take a calcium or vitamin D supplement to lower your risk of fractures. Be given hormone replacement therapy (HRT) to treat symptoms of menopause. Follow these instructions at home: Alcohol use Do not drink alcohol if: Your health care provider tells you not to drink. You are pregnant, may be pregnant, or are planning to become pregnant. If you drink alcohol: Limit how much you have to: 0-1 drink a day. Know how much alcohol is in your drink. In the [place] one drink equals one 12 oz bottle of beer (355 mL), one 5 oz glass of wine (148 mL), or one 1 oz glass of hard liquor (44 mL). Lifestyle Do not use any products that contain nicotine or tobacco. These products include cigarettes, chewing tobacco, and vaping devices, such as e-cigarettes. If you need help quitting, ask your health care provider. Do not use street drugs. Do not share needles. Ask your health care provider for help if you need support or information about quitting drugs. General instructions Schedule regular health, dental, and eye exams. Stay current with your vaccines. Tell your health care provider if: You often feel depressed. You have ever been abused or do not feel safe at home. Summary Adopting a healthy lifestyle and getting preventive care are important in promoting health and wellness. Follow your health care provider's instructions about healthy diet, exercising, and  getting tested or screened for diseases. Follow your health care provider's instructions on monitoring your cholesterol and blood pressure. This information is not intended to replace advice given to you by your health care provider. Make sure you discuss any questions you have with your health care provider. Document Revised: 06/06/2020 Document Reviewed: 06/06/2020 Elsevier Patient Education  [place].

## 2021-01-02 NOTE — Progress Notes (Signed)
Subjective:    Patient ID: Sherry Wells, female    DOB: [birth year], 70 y.o.   MRN: [id number]   This visit occurred during the SARS-CoV-2 public health emergency.  Safety protocols were in place, including screening questions prior to the visit, additional usage of staff PPE, and extensive cleaning of exam room while observing appropriate contact time as indicated for disinfecting solutions.    HPI She is here for a physical exam.   She denies changes in her health and has no concerns.   Medications and allergies reviewed with patient and updated if appropriate.  Patient Active Problem List   Diagnosis Date Noted   Overactive bladder 12/24/2018   History of breast cancer 12/05/2015   Breast cancer of upper-outer quadrant of left female breast ([place]) 05/16/2015   Breast cancer in situ 06/13/2013   Osteoporosis 09/22/2008    Current Outpatient Medications on File Prior to Visit  Medication Sig Dispense Refill   alendronate (FOSAMAX) 70 MG tablet TAKE 1 TABLET BY MOUTH EVERY 7 DAYS. TAKE WITH A FULL GLASS OF WATER ON AN EMPTY STOMACH 12 tablet 0   Calcium Carbonate-Vitamin D 600-400 MG-UNIT tablet Take 1 tablet by mouth 2 (two) times daily.     Multiple Vitamins-Minerals (MULTIVITAMIN WITH MINERALS) tablet Take 1 tablet by mouth daily.     vitamin C (ASCORBIC ACID) 500 MG tablet Take 500 mg by mouth daily.     No current facility-administered medications on file prior to visit.    Past Medical History:  Diagnosis Date   Breast cancer ([place]) 06/2013   left UOQ   Fibrocystic breast disease    Hyperlipidemia    Osteoporosis    Personal history of radiation therapy    PONV (postoperative nausea and vomiting)    S/P radiation therapy 09/08/2013-10/07/2013    1) Left breast / 40.05Gy in 15 fractions/ 2) Left Breast Boost / 10 Gy in 5 fractions    Past Surgical History:  Procedure Laterality Date   BREAST BIOPSY Left 2015   BREAST CYST ASPIRATION Left 1980   BREAST  EXCISIONAL BIOPSY Left 1985   BREAST LUMPECTOMY  1980   cyst   BREAST LUMPECTOMY Left 07/13/2013   COLONOSCOPY  2002 & 2013   negative;  [facility]   DILATION AND CURETTAGE OF UTERUS     x2   TUBAL LIGATION  1994    Social History   Socioeconomic History   Marital status: Married    Spouse name: Not on file   Number of children: Not on file   Years of education: Not on file   Highest education level: Not on file  Occupational History   Occupation: retired  Tobacco Use   Smoking status: Never   Smokeless tobacco: Never  Vaping Use   Vaping Use: Never used  Substance and Sexual Activity   Alcohol use: No   Drug use: No   Sexual activity: Yes    Comment: meanarche 24, G82, menopause age 4, no HRT  Other Topics Concern   Not on file  Social History Narrative   Not on file   Social Determinants of Health   Financial Resource Strain: Low Risk    Difficulty of Paying Living Expenses: Not hard at all  Food Insecurity: No Food Insecurity   Worried About Charity fundraiser in the Last Year: Never true   [place] in the Last Year: Never true  Transportation Needs: No Transportation Needs   Lack  of Transportation (Medical): No   Lack of Transportation (Non-Medical): No  Physical Activity: Sufficiently Active   Days of Exercise per Week: 5 days   Minutes of Exercise per Session: 30 min  Stress: No Stress Concern Present   Feeling of Stress : Not at all  Social Connections: Socially Integrated   Frequency of Communication with Friends and Family: More than three times a week   Frequency of Social Gatherings with Friends and Family: More than three times a week   Attends Religious Services: More than 4 times per year   Active Member of [employer] or Organizations: Yes   Attends Music therapist: More than 4 times per year   Marital Status: Married    Family History  Problem Relation Age of Onset   Diabetes Mother    Transient ischemic attack Mother         in 33s   Hypertension Mother    Valvular heart disease Father        Aortic valve   Osteoporosis Sister    Cardiomyopathy Sister    Throat cancer Maternal Uncle    Cancer Maternal Uncle        ? primary   Valvular heart disease Paternal Uncle        Aortic valve    Review of Systems  Constitutional:  Negative for chills and fever.  Eyes:  Negative for visual disturbance.  Respiratory:  Negative for cough, shortness of breath and wheezing.   Cardiovascular:  Negative for chest pain, palpitations and leg swelling.  Gastrointestinal:  Negative for abdominal pain, blood in stool, constipation, diarrhea and nausea.       No gerd  Genitourinary:  Negative for dysuria and hematuria.  Musculoskeletal:  Positive for arthralgias (mild - knes). Negative for back pain.  Skin:  Negative for color change and rash.  Neurological:  Negative for dizziness, light-headedness, numbness and headaches.  Psychiatric/Behavioral:  Negative for dysphoric mood. The patient is not nervous/anxious.       Objective:   Vitals:   01/03/21 1029 01/03/21 1102  BP: (!) 146/82 134/76  Pulse: 78   Temp: 98.6 F (37 C)   SpO2: 98%    Filed Weights   01/03/21 1029  Weight: 152 lb (68.9 kg)   Body mass index is 25.29 kg/m.  BP Readings from Last 3 Encounters:  01/03/21 134/76  09/07/20 (!) 152/81  04/01/20 118/70    Wt Readings from Last 3 Encounters:  01/03/21 152 lb (68.9 kg)  04/01/20 149 lb 9.6 oz (67.9 kg)  01/01/20 148 lb 9.6 oz (67.4 kg)    Depression screen [facility] 2/9 04/01/2020 01/01/2020 02/17/2018 12/17/2017 12/06/2016  Decreased Interest 0 0 0 0 0  Down, Depressed, Hopeless 0 0 0 0 0  PHQ - 2 Score 0 0 0 0 0  Altered sleeping - - - 0 -  Tired, decreased energy - - - 0 -  Change in appetite - - - 0 -  Feeling bad or failure about yourself  - - - 0 -  Trouble concentrating - - - 0 -  Moving slowly or fidgety/restless - - - 0 -  Suicidal thoughts - - - 0 -  PHQ-9 Score - - - 0 -   Difficult doing work/chores - - - Not difficult at all -     No flowsheet data found.     Physical Exam Constitutional: She appears well-developed and well-nourished. No distress.  HENT:  Head: Normocephalic and atraumatic.  Right Ear: External ear normal. Normal ear canal and TM Left Ear: External ear normal.  Normal ear canal and TM Mouth/Throat: Oropharynx is clear and moist.  Eyes: Conjunctivae and EOM are normal.  Neck: Neck supple. No tracheal deviation present. No thyromegaly present.  No carotid bruit  Cardiovascular: Normal rate, regular rhythm and normal heart sounds.   No murmur heard.  No edema. Pulmonary/Chest: Effort normal and breath sounds normal. No respiratory distress. She has no wheezes. She has no rales.  Breast: deferred   Abdominal: Soft. She exhibits no distension. There is no tenderness.  Lymphadenopathy: She has no cervical adenopathy.  Skin: Skin is warm and dry. She is not diaphoretic.  Psychiatric: She has a normal mood and affect. Her behavior is normal.     Lab Results  Component Value Date   WBC 5.2 01/01/2020   HGB 13.6 01/01/2020   HCT 40.2 01/01/2020   PLT 194.0 01/01/2020   GLUCOSE 97 01/01/2020   CHOL 209 (H) 01/01/2020   TRIG 59.0 01/01/2020   HDL 82.90 01/01/2020   LDLDIRECT 109.5 09/22/2008   LDLCALC 114 (H) 01/01/2020   ALT 17 01/01/2020   AST 20 01/01/2020   NA 140 01/01/2020   K 4.0 01/01/2020   CL 102 01/01/2020   CREATININE 0.77 01/01/2020   BUN 15 01/01/2020   CO2 30 01/01/2020   TSH 1.88 01/01/2020   HGBA1C 5.2 01/01/2020         Assessment & Plan:   Physical exam: Screening blood work  ordered Exercise  regular Weight  good Substance abuse  none   Reviewed recommended immunizations.   Health Maintenance  Topic Date Due   COVID-19 Vaccine (4 - Booster for Pfizer series) 01/19/2021 (Originally 01/14/2020)   MAMMOGRAM  08/23/2021   TETANUS/TDAP  10/16/2021   COLONOSCOPY (Pts 45-96yrs Insurance  coverage will need to be confirmed)  11/21/2021   DEXA SCAN  01/04/2022   Pneumonia Vaccine 31+ Years old  Completed   INFLUENZA VACCINE  Completed   Hepatitis C Screening  Completed   Zoster Vaccines- Shingrix  Completed   HPV VACCINES  Aged Out          See Problem List for Assessment and Plan of chronic medical problems.

## 2021-01-03 ENCOUNTER — Ambulatory Visit (INDEPENDENT_AMBULATORY_CARE_PROVIDER_SITE_OTHER): Payer: PPO | Admitting: Internal Medicine

## 2021-01-03 ENCOUNTER — Encounter: Payer: Self-pay | Admitting: Internal Medicine

## 2021-01-03 ENCOUNTER — Other Ambulatory Visit: Payer: Self-pay

## 2021-01-03 VITALS — BP 134/76 | HR 78 | Temp 98.6°F | Ht 65.0 in | Wt 152.0 lb

## 2021-01-03 DIAGNOSIS — Z853 Personal history of malignant neoplasm of breast: Secondary | ICD-10-CM | POA: Diagnosis not present

## 2021-01-03 DIAGNOSIS — Z0000 Encounter for general adult medical examination without abnormal findings: Secondary | ICD-10-CM

## 2021-01-03 DIAGNOSIS — N3281 Overactive bladder: Secondary | ICD-10-CM | POA: Diagnosis not present

## 2021-01-03 DIAGNOSIS — M810 Age-related osteoporosis without current pathological fracture: Secondary | ICD-10-CM

## 2021-01-03 LAB — TSH: TSH: 1.8 u[IU]/mL (ref 0.35–5.50)

## 2021-01-03 LAB — LIPID PANEL
Cholesterol: 200 mg/dL (ref 0–200)
HDL: 83 mg/dL (ref >39.00)
LDL Cholesterol: 102 mg/dL — ABNORMAL HIGH (ref 0–99)
NonHDL: 116.57
Total CHOL/HDL Ratio: 2
Triglycerides: 75 mg/dL (ref 0.0–149.0)
VLDL: 15 mg/dL (ref 0.0–40.0)

## 2021-01-03 LAB — CBC WITH DIFFERENTIAL/PLATELET
Basophils Absolute: 0 10*3/uL (ref 0.0–0.1)
Basophils Relative: 0.3 % (ref 0.0–3.0)
Eosinophils Absolute: 0.1 10*3/uL (ref 0.0–0.7)
Eosinophils Relative: 1.4 % (ref 0.0–5.0)
HCT: 38.7 % (ref 36.0–46.0)
Hemoglobin: 12.7 g/dL (ref 12.0–15.0)
Lymphocytes Relative: 29.9 % (ref 12.0–46.0)
Lymphs Abs: 1.7 10*3/uL (ref 0.7–4.0)
MCHC: 32.7 g/dL (ref 30.0–36.0)
MCV: 98.8 fl (ref 78.0–100.0)
Monocytes Absolute: 0.4 10*3/uL (ref 0.1–1.0)
Monocytes Relative: 7.5 % (ref 3.0–12.0)
Neutro Abs: 3.5 10*3/uL (ref 1.4–7.7)
Neutrophils Relative %: 60.9 % (ref 43.0–77.0)
Platelets: 201 10*3/uL (ref 150.0–400.0)
RBC: 3.92 Mil/uL (ref 3.87–5.11)
RDW: 14.7 % (ref 11.5–15.5)
WBC: 5.8 10*3/uL (ref 4.0–10.5)

## 2021-01-03 LAB — COMPREHENSIVE METABOLIC PANEL
ALT: 16 U/L (ref 0–35)
AST: 20 U/L (ref 0–37)
Albumin: 4.3 g/dL (ref 3.5–5.2)
Alkaline Phosphatase: 54 U/L (ref 39–117)
BUN: 15 mg/dL (ref 6–23)
CO2: 30 mEq/L (ref 19–32)
Calcium: 10.1 mg/dL (ref 8.4–10.5)
Chloride: 103 mEq/L (ref 96–112)
Creatinine, Ser: 0.7 mg/dL (ref 0.40–1.20)
GFR: 87.41 mL/min (ref >60.00)
Glucose, Bld: 95 mg/dL (ref 70–99)
Potassium: 4.4 mEq/L (ref 3.5–5.1)
Sodium: 139 mEq/L (ref 135–145)
Total Bilirubin: 0.7 mg/dL (ref 0.2–1.2)
Total Protein: 6.7 g/dL (ref 6.0–8.3)

## 2021-01-03 LAB — VITAMIN D 25 HYDROXY (VIT D DEFICIENCY, FRACTURES): VITD: 54.05 ng/mL (ref 30.00–100.00)

## 2021-01-03 NOTE — Assessment & Plan Note (Signed)
H/o  Breast cancer Mammogram up to date No evidence of recurrence

## 2021-01-03 NOTE — Assessment & Plan Note (Signed)
Chronic DEXA up-to-date Walking regularly Continue calcium and vitamin D daily Check vitamin D level, CMP Continue fosamax 70 mg weekly - started 01/2018

## 2021-01-03 NOTE — Assessment & Plan Note (Signed)
Chronic Oxybutynin not really effective symptoms tolerable - deferred trying further medications

## 2021-01-27 ENCOUNTER — Other Ambulatory Visit: Payer: Self-pay | Admitting: Internal Medicine

## 2021-02-06 ENCOUNTER — Ambulatory Visit
Admission: EM | Admit: 2021-02-06 | Discharge: 2021-02-06 | Disposition: A | Discharge status: Home / Self Care | Payer: PPO | Attending: Family Medicine | Admitting: Family Medicine

## 2021-02-06 ENCOUNTER — Other Ambulatory Visit: Payer: Self-pay

## 2021-02-06 DIAGNOSIS — J069 Acute upper respiratory infection, unspecified: Secondary | ICD-10-CM | POA: Diagnosis not present

## 2021-02-06 MED ORDER — PROMETHAZINE-DM 6.25-15 MG/5ML PO SYRP: 5.0000 mL | ORAL_SOLUTION | Freq: Four times a day (QID) | ORAL | 0 refills | Status: DC | PRN

## 2021-02-06 NOTE — ED Triage Notes (Signed)
Patient states she has a bad cough in her chest since Friday.   Patient states she has a little runny nose.   Patient states she has tried Copywriter, advertising cold Plus and Robitussin without any relief.    Denies Fever

## 2021-02-06 NOTE — ED Provider Notes (Signed)
[facility]    CSN: [id number] Arrival date & time: 02/06/21  1008      History   Chief Complaint Chief Complaint  Patient presents with   Cough    HPI Sherry Wells is a 71 y.o. female.   Patient presenting today with 3-day history of productive cough, runny nose, fatigue.  Denies chest pain, shortness of breath, abdominal pain, nausea vomiting diarrhea, fever, chills, body aches.  So far taking Alka-Seltzer cold plus and Robitussin with only short-term temporary relief.  No known sick contacts recently.  No known history of asthma, COPD, seasonal allergies that she is aware of.   Past Medical History:  Diagnosis Date   Breast cancer ([place]) 06/2013   left UOQ   Fibrocystic breast disease    Hyperlipidemia    Osteoporosis    Personal history of radiation therapy    PONV (postoperative nausea and vomiting)    S/P radiation therapy 09/08/2013-10/07/2013    1) Left breast / 40.05Gy in 15 fractions/ 2) Left Breast Boost / 10 Gy in 5 fractions    Patient Active Problem List   Diagnosis Date Noted   Overactive bladder 12/24/2018   History of breast cancer 12/05/2015   Breast cancer of upper-outer quadrant of left female breast ([place]) 05/16/2015   Breast cancer in situ 06/13/2013   Osteoporosis 09/22/2008    Past Surgical History:  Procedure Laterality Date   BREAST BIOPSY Left 2015   BREAST CYST ASPIRATION Left 1980   BREAST EXCISIONAL BIOPSY Left 1985   BREAST LUMPECTOMY  1980   cyst   BREAST LUMPECTOMY Left 07/13/2013   COLONOSCOPY  2002 & 2013   negative; [facility]   DILATION AND CURETTAGE OF UTERUS     x2   TUBAL LIGATION  1994    OB History   No obstetric history on file.      Home Medications    Prior to Admission medications   Medication Sig Start Date End Date Taking? Authorizing Provider  promethazine-dextromethorphan (PROMETHAZINE-DM) 6.25-15 MG/5ML syrup Take 5 mLs by mouth 4 (four) times daily as needed. 02/06/21  Yes Volney American, PA-C  alendronate (FOSAMAX) 70 MG tablet TAKE 1 TABLET BY MOUTH EVERY 7 DAYS WITH A FULL GLASS OF WATER AND ON AN EMPTY STOMACH 01/29/21   Binnie Rail, MD  Calcium Carbonate-Vitamin D 600-400 MG-UNIT tablet Take 1 tablet by mouth 2 (two) times daily.    [provider]  Multiple Vitamins-Minerals (MULTIVITAMIN WITH MINERALS) tablet Take 1 tablet by mouth daily.    [provider]  vitamin C (ASCORBIC ACID) 500 MG tablet Take 500 mg by mouth daily.    [provider]    Family History Family History  Problem Relation Age of Onset   Diabetes Mother    Transient ischemic attack Mother        in 81s   Hypertension Mother    Valvular heart disease Father        Aortic valve   Osteoporosis Sister    Cardiomyopathy Sister    Throat cancer Maternal Uncle    Cancer Maternal Uncle        ? primary   Valvular heart disease Paternal Uncle        Aortic valve    Social History Social History   Tobacco Use   Smoking status: Never   Smokeless tobacco: Never  Vaping Use   Vaping Use: Never used  Substance Use Topics   Alcohol  use: No   Drug use: No     Allergies   Patient has no known allergies.   Review of Systems Review of Systems Per HPI  Physical Exam Triage Vital Signs ED Triage Vitals  Enc Vitals Group     BP 02/06/21 1130 139/84     Pulse Rate 02/06/21 1130 73     Resp 02/06/21 1130 16     Temp 02/06/21 1130 99.7 F (37.6 C)     Temp Source 02/06/21 1130 Oral     SpO2 02/06/21 1130 93 %     Weight --      Height --      Head Circumference --      Peak Flow --      Pain Score 02/06/21 1128 0     Pain Loc --      Pain Edu? --      Excl. in [place]? --    No data found.  Updated Vital Signs BP 139/84 (BP Location: Right Arm)    Pulse 73    Temp 99.7 F (37.6 C) (Oral)    Resp 16    SpO2 98%   Visual Acuity Right Eye Distance:   Left Eye Distance:   Bilateral Distance:    Right Eye Near:   Left Eye Near:    Bilateral  Near:     Physical Exam Vitals and nursing note reviewed.  Constitutional:      Appearance: Normal appearance.  HENT:     Head: Atraumatic.     Right Ear: Tympanic membrane and external ear normal.     Left Ear: Tympanic membrane and external ear normal.     Nose: Rhinorrhea present.     Mouth/Throat:     Mouth: Mucous membranes are moist.     Pharynx: Posterior oropharyngeal erythema present.  Eyes:     Extraocular Movements: Extraocular movements intact.     Conjunctiva/sclera: Conjunctivae normal.  Cardiovascular:     Rate and Rhythm: Normal rate and regular rhythm.     Heart sounds: Normal heart sounds.  Pulmonary:     Effort: Pulmonary effort is normal.     Breath sounds: Normal breath sounds. No wheezing or rales.  Musculoskeletal:        General: Normal range of motion.     Cervical back: Normal range of motion and neck supple.  Skin:    General: Skin is warm and dry.  Neurological:     Mental Status: She is alert and oriented to person, place, and time.  Psychiatric:        Mood and Affect: Mood normal.        Thought Content: Thought content normal.     UC Treatments / Results  Labs (all labs ordered are listed, but only abnormal results are displayed) Labs Reviewed  COVID-19, FLU A+B NAA    EKG   Radiology No results found.  Procedures Procedures (including critical care time)  Medications Ordered in UC Medications - No data to display  Initial Impression / Assessment and Plan / UC Course  I have reviewed the triage vital signs and the nursing notes.  Pertinent labs & imaging results that were available during my care of the patient were reviewed by me and considered in my medical decision making (see chart for details).     Vital signs benign and reassuring, suspect viral upper respiratory infection causing symptoms.  COVID and flu testing pending, treat with Phenergan DM, supportive over-the-counter medications and  home care and discussed to  return if worsening anytime.  Final Clinical Impressions(s) / UC Diagnoses   Final diagnoses:  Viral URI with cough   Discharge Instructions   None    ED Prescriptions     Medication Sig Dispense Auth. Provider   promethazine-dextromethorphan (PROMETHAZINE-DM) 6.25-15 MG/5ML syrup Take 5 mLs by mouth 4 (four) times daily as needed. 100 mL Volney American, [place]      PDMP not reviewed this encounter.   Volney American, [place] 02/06/21 1152

## 2021-02-07 LAB — COVID-19, FLU A+B NAA

## 2021-02-08 ENCOUNTER — Ambulatory Visit
Admission: EM | Admit: 2021-02-08 | Discharge: 2021-02-08 | Disposition: A | Discharge status: Home / Self Care | Payer: PPO | Attending: Urgent Care | Admitting: Urgent Care

## 2021-02-08 ENCOUNTER — Other Ambulatory Visit: Payer: Self-pay

## 2021-02-08 DIAGNOSIS — Z20822 Contact with and (suspected) exposure to covid-19: Secondary | ICD-10-CM

## 2021-02-08 NOTE — ED Notes (Signed)
Pt re-swabbed per message regarding nurse visit. Pt asked to blow nose prior to repeat swab being obtained. Pt tolerated well. Aware results will be available via my chart 48-72 hours.

## 2021-02-09 LAB — COVID-19, FLU A+B NAA
Influenza A, NAA: NOT DETECTED
Influenza B, NAA: NOT DETECTED
SARS-CoV-2, NAA: NOT DETECTED

## 2021-03-28 ENCOUNTER — Telehealth: Payer: Self-pay | Admitting: Internal Medicine

## 2021-03-28 NOTE — Telephone Encounter (Signed)
Left message for patient to call back to schedule Medicare Annual Wellness Visit   Last AWV  04/01/20  Please schedule at anytime with [facility] if patient calls the office back.    40 Minutes appointment   Any questions, please call me at [phone]

## 2021-04-11 ENCOUNTER — Ambulatory Visit (INDEPENDENT_AMBULATORY_CARE_PROVIDER_SITE_OTHER): Payer: PPO

## 2021-04-11 ENCOUNTER — Other Ambulatory Visit: Payer: Self-pay

## 2021-04-11 VITALS — BP 118/60 | HR 68 | Temp 98.0°F | Resp 16 | Ht 65.0 in | Wt 154.0 lb

## 2021-04-11 DIAGNOSIS — Z0000 Encounter for general adult medical examination without abnormal findings: Secondary | ICD-10-CM | POA: Diagnosis not present

## 2021-04-11 NOTE — Progress Notes (Signed)
? ?Subjective:  ? Sherry Wells is a 71 y.o. female who presents for Medicare Annual (Subsequent) preventive examination. ? ?Review of Systems    ? ?Cardiac Risk Factors include: advanced age (>66mn, >>62women);family history of premature cardiovascular disease ? ?   ?Objective:  ?  ?Today's Vitals  ? 04/11/21 1119  ?BP: 118/60  ?Pulse: 68  ?Resp: 16  ?Temp: 98 ?F (36.7 ?C)  ?TempSrc: Temporal  ?SpO2: 99%  ?Weight: 154 lb (69.9 kg)  ?Height: '5\' 5"'$  (1.651 m)  ?PainSc: 0-No pain  ? ?Body mass index is 25.63 kg/m?. ? ?Advanced Directives 04/11/2021 04/01/2020 12/17/2017 07/28/2014 11/20/2013 07/29/2013 07/06/2013  ?Does Patient Have a Medical Advance Directive? Yes No No No No Patient does not have advance directive;Patient would not like information Patient does not have advance directive;Patient would not like information  ?Type of AParamedicof ANapi HeadquartersLiving will - - - - - -  ?Copy of [place]? Yes - validated most recent copy scanned in chart (See row information) - - - - - -  ?Would patient like information on creating a medical advance directive? - Yes (MAU/Ambulatory/Procedural Areas - Information given) No - Patient declined No - patient declined information No - patient declined information - -  ? ? ?Current Medications (verified) ?Outpatient Encounter Medications as of 04/11/2021  ?Medication Sig  ? alendronate (FOSAMAX) 70 MG tablet TAKE 1 TABLET BY MOUTH EVERY 7 DAYS WITH A FULL GLASS OF WATER AND ON AN EMPTY STOMACH  ? Calcium Carbonate-Vitamin D 600-400 MG-UNIT tablet Take 1 tablet by mouth 2 (two) times daily.  ? Multiple Vitamins-Minerals (MULTIVITAMIN WITH MINERALS) tablet Take 1 tablet by mouth daily.  ? vitamin C (ASCORBIC ACID) 500 MG tablet Take 500 mg by mouth daily.  ? [DISCONTINUED] promethazine-dextromethorphan (PROMETHAZINE-DM) 6.25-15 MG/5ML syrup Take 5 mLs by mouth 4 (four) times daily as needed.  ? ?No facility-administered encounter  medications on file as of 04/11/2021.  ? ? ?Allergies (verified) ?Patient has no known allergies.  ? ?History: ?Past Medical History:  ?Diagnosis Date  ? Breast cancer ([facility] 06/2013  ? left [facility]  ? Fibrocystic breast disease   ? Hyperlipidemia   ? Osteoporosis   ? Personal history of radiation therapy   ? PONV (postoperative nausea and vomiting)   ? S/P radiation therapy 09/08/2013-10/07/2013  ?  1) Left breast / 40.05Gy in 15 fractions/ 2) Left Breast Boost / 10 Gy in 5 fractions  ? ?Past Surgical History:  ?Procedure Laterality Date  ? BREAST BIOPSY Left 2015  ? BREAST CYST ASPIRATION Left 1980  ? BREAST EXCISIONAL BIOPSY Left 1985  ? BREAST LUMPECTOMY  1980  ? cyst  ? BREAST LUMPECTOMY Left 07/13/2013  ? COLONOSCOPY  2002 & 2013  ? negative; [facility]  ? DILATION AND CURETTAGE OF UTERUS    ? x2  ? TUBAL LIGATION  1994  ? ?Family History  ?Problem Relation Age of Onset  ? Diabetes Mother   ? Transient ischemic attack Mother   ?     in 861s ? Hypertension Mother   ? Valvular heart disease Father   ?     Aortic valve  ? Osteoporosis Sister   ? Cardiomyopathy Sister   ? Throat cancer Maternal Uncle   ? Cancer Maternal Uncle   ?     ? primary  ? Valvular heart disease Paternal Uncle   ?     Aortic valve  ? ?Social History  ? ?  Socioeconomic History  ? Marital status: Married  ?  Spouse name: Not on file  ? Number of children: Not on file  ? Years of education: Not on file  ? Highest education level: Not on file  ?Occupational History  ? Occupation: retired  ?Tobacco Use  ? Smoking status: Never  ? Smokeless tobacco: Never  ?Vaping Use  ? Vaping Use: Never used  ?Substance and Sexual Activity  ? Alcohol use: No  ? Drug use: No  ? Sexual activity: Yes  ?  Comment: meanarche 65, G63, menopause age 51, no HRT  ?Other Topics Concern  ? Not on file  ?Social History Narrative  ? Not on file  ? ?Social Determinants of Health  ? ?Financial Resource Strain: Low Risk   ? Difficulty of Paying Living Expenses: Not hard at all  ?Food  Insecurity: No Food Insecurity  ? Worried About Charity fundraiser in the Last Year: Never true  ? Ran Out of Food in the Last Year: Never true  ?Transportation Needs: No Transportation Needs  ? Lack of Transportation (Medical): No  ? Lack of Transportation (Non-Medical): No  ?Physical Activity: Sufficiently Active  ? Days of Exercise per Week: 6 days  ? Minutes of Exercise per Session: 60 min  ?Stress: No Stress Concern Present  ? Feeling of Stress : Not at all  ?Social Connections: Socially Integrated  ? Frequency of Communication with Friends and Family: More than three times a week  ? Frequency of Social Gatherings with Friends and Family: More than three times a week  ? Attends Religious Services: More than 4 times per year  ? Active Member of Clubs or Organizations: Yes  ? Attends Archivist Meetings: More than 4 times per year  ? Marital Status: Married  ? ? ?Tobacco Counseling ?Counseling given: Not Answered ? ? ?Clinical Intake: ? ?Pre-visit preparation completed: Yes ? ?Pain : No/denies pain ?Pain Score: 0-No pain ? ?  ? ?BMI - recorded: 25.63 ?Nutritional Status: BMI 25 -29 Overweight ?Nutritional Risks: None ?Diabetes: No ? ?How often do you need to have someone help you when you read instructions, pamphlets, or other written materials from your doctor or pharmacy?: 1 - Never ?What is the last grade level you completed in school?: HSG ? ?Diabetic? no ? ?Interpreter Needed?: No ? ?Information entered by :: Lisette Abu, LPN ? ? ?Activities of Daily Living ?In your present state of health, do you have any difficulty performing the following activities: 04/11/2021  ?Hearing? N  ?Vision? N  ?Difficulty concentrating or making decisions? N  ?Walking or climbing stairs? N  ?Dressing or bathing? N  ?Doing errands, shopping? N  ?Preparing Food and eating ? N  ?Using the Toilet? N  ?In the past six months, have you accidently leaked urine? N  ?Do you have problems with loss of bowel control? N   ?Managing your Medications? N  ?Managing your Finances? N  ?Housekeeping or managing your Housekeeping? N  ?Some recent data might be hidden  ? ? ?Patient Care Team: ?Binnie Rail, MD as PCP - General (Internal Medicine) ?Marygrace Drought, MD as Consulting Physician (Ophthalmology) ? ?Indicate any recent Medical Services you may have received from other than [facility] providers in the past year (date may be approximate). ? ?   ?Assessment:  ? This is a routine wellness examination for Jacoria. ? ?Hearing/Vision screen ?Hearing Screening - Comments:: Patient denied any hearing difficulty.   ?No hearing aids. ? ?Vision Screening -  Comments:: Patient does wear corrective lenses/contacts.   ?Eye exam done CW:[id number] Tanner, MD. ? ?Dietary issues and exercise activities discussed: ?Current Exercise Habits: Home exercise routine, Type of exercise: walking, Time (Minutes): 60, Frequency (Times/Week): 6, Weekly Exercise (Minutes/Week): 360, Intensity: Moderate, Exercise limited by: None identified ? ? Goals Addressed   ? ?  ?  ?  ?  ?  ? This Visit's Progress  ?   Client understands the importance of follow-up with providers by attending scheduled visits (pt-stated)     ?   My goal is to stay healthy and well. ?  ? ?  ?Depression Screen ?PHQ 2/9 Scores 04/11/2021 04/01/2020 01/01/2020 02/17/2018 12/17/2017 12/06/2016 12/05/2015  ?PHQ - 2 Score 0 0 0 0 0 0 0  ?PHQ- 9 Score - - - - 0 - -  ?  ?Fall Risk ?Fall Risk  04/11/2021 04/01/2020 01/01/2020 12/24/2018 02/17/2018  ?Falls in the past year? 0 0 0 0 0  ?Number falls in past yr: 0 0 0 0 -  ?Injury with Fall? 0 0 0 - -  ?Risk for fall due to : No Fall Risks No Fall Risks No Fall Risks - -  ?Follow up Falls evaluation completed Falls evaluation completed Falls evaluation completed - -  ? ? ?FALL RISK PREVENTION PERTAINING TO THE HOME: ? ?Any stairs in or around the home? Yes  ?If so, are there any without handrails? No  ?Home free of loose throw rugs in walkways, pet beds, electrical cords,  etc? Yes  ?Adequate lighting in your home to reduce risk of falls? Yes  ? ?ASSISTIVE DEVICES UTILIZED TO PREVENT FALLS: ? ?Life alert? No  ?Use of a cane, walker or w/c? No  ?Grab bars in the bathroom? Yes  ?Show

## 2021-04-11 NOTE — Patient Instructions (Signed)
Sherry Wells , ?Thank you for taking time to come for your Medicare Wellness Visit. I appreciate your ongoing commitment to your health goals. Please review the following plan we discussed and let me know if I can assist you in the future.  ? ?Screening recommendations/referrals: ?Colonoscopy: 11/22/2011; due every 10 years ?Mammogram: 08/23/2020; due every year ?Bone Density: 01/05/2020; due every 2 years ?Recommended yearly ophthalmology/optometry visit for glaucoma screening and checkup ?Recommended yearly dental visit for hygiene and checkup ? ?Vaccinations: ?Influenza vaccine: 11/14/2020 ?Pneumococcal vaccine: 12/06/2016, 12/17/2017 ?Tdap vaccine: 10/17/2011; due every 10 years  ?Shingles vaccine: 03/14/2018, 07/23/2018 ?Covid-19: 03/13/2019, 04/03/2019, 11/19/2019 ? ?Advanced directives: Yes; documents brought in by patient on 04/11/2021 ? ?Conditions/risks identified: Yes; Client understands the importance of follow-up appointments with providers by attending scheduled visits and discussed goals to eat healthier, increase physical activity 5 times a week for 30 minutes each, exercise the brain by doing stimulating brain exercises (reading, adult coloring, crafting, listening to music, puzzles, etc.), socialize and enjoy life more, get enough sleep at least 8-9 hours average per night and make time for laughter. ? ?Next appointment: Please schedule your next Medicare Wellness Visit with your Nurse Health Advisor in 1 year by calling [phone]. ? ? ?Preventive Care 79 Years and Older, Female ?Preventive care refers to lifestyle choices and visits with your health care provider that can promote health and wellness. ?What does preventive care include? ?A yearly physical exam. This is also called an annual well check. ?Dental exams once or twice a year. ?Routine eye exams. Ask your health care provider how often you should have your eyes checked. ?Personal lifestyle choices, including: ?Daily care of your teeth and  gums. ?Regular physical activity. ?Eating a healthy diet. ?Avoiding tobacco and drug use. ?Limiting alcohol use. ?Practicing safe sex. ?Taking low-dose aspirin every day. ?Taking vitamin and mineral supplements as recommended by your health care provider. ?What happens during an annual well check? ?The services and screenings done by your health care provider during your annual well check will depend on your age, overall health, lifestyle risk factors, and family history of disease. ?Counseling  ?Your health care provider may ask you questions about your: ?Alcohol use. ?Tobacco use. ?Drug use. ?Emotional well-being. ?Home and relationship well-being. ?Sexual activity. ?Eating habits. ?History of falls. ?Memory and ability to understand (cognition). ?Work and work Statistician. ?Reproductive health. ?Screening  ?You may have the following tests or measurements: ?Height, weight, and BMI. ?Blood pressure. ?Lipid and cholesterol levels. These may be checked every 5 years, or more frequently if you are over 47 years old. ?Skin check. ?Lung cancer screening. You may have this screening every year starting at age 14 if you have a 30-pack-year history of smoking and currently smoke or have quit within the past 15 years. ?Fecal occult blood test (FOBT) of the stool. You may have this test every year starting at age 59. ?Flexible sigmoidoscopy or colonoscopy. You may have a sigmoidoscopy every 5 years or a colonoscopy every 10 years starting at age 89. ?Hepatitis C blood test. ?Hepatitis B blood test. ?Sexually transmitted disease (STD) testing. ?Diabetes screening. This is done by checking your blood sugar (glucose) after you have not eaten for a while (fasting). You may have this done every 1-3 years. ?Bone density scan. This is done to screen for osteoporosis. You may have this done starting at age 4. ?Mammogram. This may be done every 1-2 years. Talk to your health care provider about how often you should have regular  mammograms. ?Talk with your health care provider about your test results, treatment options, and if necessary, the need for more tests. ?Vaccines  ?Your health care provider may recommend certain vaccines, such as: ?Influenza vaccine. This is recommended every year. ?Tetanus, diphtheria, and acellular pertussis (Tdap, Td) vaccine. You may need a Td booster every 10 years. ?Zoster vaccine. You may need this after age 44. ?Pneumococcal 13-valent conjugate (PCV13) vaccine. One dose is recommended after age 74. ?Pneumococcal polysaccharide (PPSV23) vaccine. One dose is recommended after age 85. ?Talk to your health care provider about which screenings and vaccines you need and how often you need them. ?This information is not intended to replace advice given to you by your health care provider. Make sure you discuss any questions you have with your health care provider. ?Document Released: 02/11/2015 Document Revised: 10/05/2015 Document Reviewed: 11/16/2014 ?Elsevier Interactive Patient Education ? 2017 [place]. ? ?Fall Prevention in the Home ?Falls can cause injuries. They can happen to people of all ages. There are many things you can do to make your home safe and to help prevent falls. ?What can I do on the outside of my home? ?Regularly fix the edges of walkways and driveways and fix any cracks. ?Remove anything that might make you trip as you walk through a door, such as a raised step or threshold. ?Trim any bushes or trees on the path to your home. ?Use bright outdoor lighting. ?Clear any walking paths of anything that might make someone trip, such as rocks or tools. ?Regularly check to see if handrails are loose or broken. Make sure that both sides of any steps have handrails. ?Any raised decks and porches should have guardrails on the edges. ?Have any leaves, snow, or ice cleared regularly. ?Use sand or salt on walking paths during winter. ?Clean up any spills in your garage right away. This includes oil  or grease spills. ?What can I do in the bathroom? ?Use night lights. ?Install grab bars by the toilet and in the tub and shower. Do not use towel bars as grab bars. ?Use non-skid mats or decals in the tub or shower. ?If you need to sit down in the shower, use a plastic, non-slip stool. ?Keep the floor dry. Clean up any water that spills on the floor as soon as it happens. ?Remove soap buildup in the tub or shower regularly. ?Attach bath mats securely with double-sided non-slip rug tape. ?Do not have throw rugs and other things on the floor that can make you trip. ?What can I do in the bedroom? ?Use night lights. ?Make sure that you have a light by your bed that is easy to reach. ?Do not use any sheets or blankets that are too big for your bed. They should not hang down onto the floor. ?Have a firm chair that has side arms. You can use this for support while you get dressed. ?Do not have throw rugs and other things on the floor that can make you trip. ?What can I do in the kitchen? ?Clean up any spills right away. ?Avoid walking on wet floors. ?Keep items that you use a lot in easy-to-reach places. ?If you need to reach something above you, use a strong step stool that has a grab bar. ?Keep electrical cords out of the way. ?Do not use floor polish or wax that makes floors slippery. If you must use wax, use non-skid floor wax. ?Do not have throw rugs and other things on the floor that can make  you trip. ?What can I do with my stairs? ?Do not leave any items on the stairs. ?Make sure that there are handrails on both sides of the stairs and use them. Fix handrails that are broken or loose. Make sure that handrails are as long as the stairways. ?Check any carpeting to make sure that it is firmly attached to the stairs. Fix any carpet that is loose or worn. ?Avoid having throw rugs at the top or bottom of the stairs. If you do have throw rugs, attach them to the floor with carpet tape. ?Make sure that you have a light  switch at the top of the stairs and the bottom of the stairs. If you do not have them, ask someone to add them for you. ?What else can I do to help prevent falls? ?Wear shoes that: ?Do not have high heels. ?Have r

## 2021-06-22 DIAGNOSIS — Z01419 Encounter for gynecological examination (general) (routine) without abnormal findings: Secondary | ICD-10-CM | POA: Diagnosis not present

## 2021-07-24 ENCOUNTER — Other Ambulatory Visit: Payer: Self-pay | Admitting: Internal Medicine

## 2021-07-24 DIAGNOSIS — Z1231 Encounter for screening mammogram for malignant neoplasm of breast: Secondary | ICD-10-CM

## 2021-08-24 ENCOUNTER — Ambulatory Visit
Admission: RE | Admit: 2021-08-24 | Discharge: 2021-08-24 | Disposition: A | Discharge status: Home / Self Care | Payer: PPO | Source: Ambulatory Visit | Attending: Internal Medicine | Admitting: Internal Medicine

## 2021-08-24 DIAGNOSIS — Z1231 Encounter for screening mammogram for malignant neoplasm of breast: Secondary | ICD-10-CM | POA: Diagnosis not present

## 2021-09-04 DIAGNOSIS — H353131 Nonexudative age-related macular degeneration, bilateral, early dry stage: Secondary | ICD-10-CM | POA: Diagnosis not present

## 2021-09-04 DIAGNOSIS — H524 Presbyopia: Secondary | ICD-10-CM | POA: Diagnosis not present

## 2021-09-04 DIAGNOSIS — H04123 Dry eye syndrome of bilateral lacrimal glands: Secondary | ICD-10-CM | POA: Diagnosis not present

## 2021-09-04 DIAGNOSIS — H2513 Age-related nuclear cataract, bilateral: Secondary | ICD-10-CM | POA: Diagnosis not present

## 2021-11-23 ENCOUNTER — Encounter: Payer: Self-pay | Admitting: Internal Medicine

## 2021-12-25 ENCOUNTER — Other Ambulatory Visit: Payer: Self-pay | Admitting: Internal Medicine

## 2022-01-03 ENCOUNTER — Encounter: Payer: Self-pay | Admitting: Internal Medicine

## 2022-01-03 NOTE — Patient Instructions (Addendum)
Blood work was ordered.   The lab is on the first floor.    Medications changes include :   none    Return in about 1 year (around 01/06/2023) for Physical Exam, Schedule DEXA-[facility].     Health Maintenance, Female Adopting a healthy lifestyle and getting preventive care are important in promoting health and wellness. Ask your health care provider about: The right schedule for you to have regular tests and exams. Things you can do on your own to prevent diseases and keep yourself healthy. What should I know about diet, weight, and exercise? Eat a healthy diet  Eat a diet that includes plenty of vegetables, fruits, low-fat dairy products, and lean protein. Do not eat a lot of foods that are high in solid fats, added sugars, or sodium. Maintain a healthy weight Body mass index (BMI) is used to identify weight problems. It estimates body fat based on height and weight. Your health care provider can help determine your BMI and help you achieve or maintain a healthy weight. Get regular exercise Get regular exercise. This is one of the most important things you can do for your health. Most adults should: Exercise for at least 150 minutes each week. The exercise should increase your heart rate and make you sweat (moderate-intensity exercise). Do strengthening exercises at least twice a week. This is in addition to the moderate-intensity exercise. Spend less time sitting. Even light physical activity can be beneficial. Watch cholesterol and blood lipids Have your blood tested for lipids and cholesterol at 70 years of age, then have this test every 5 years. Have your cholesterol levels checked more often if: Your lipid or cholesterol levels are high. You are older than 71 years of age. You are at high risk for heart disease. What should I know about cancer screening? Depending on your health history and family history, you may need to have cancer screening at various ages. This may  include screening for: Breast cancer. Cervical cancer. Colorectal cancer. Skin cancer. Lung cancer. What should I know about heart disease, diabetes, and high blood pressure? Blood pressure and heart disease High blood pressure causes heart disease and increases the risk of stroke. This is more likely to develop in people who have high blood pressure readings or are overweight. Have your blood pressure checked: Every 3-5 years if you are 66-48 years of age. Every year if you are 65 years old or older. Diabetes Have regular diabetes screenings. This checks your fasting blood sugar level. Have the screening done: Once every three years after age 46 if you are at a normal weight and have a low risk for diabetes. More often and at a younger age if you are overweight or have a high risk for diabetes. What should I know about preventing infection? Hepatitis B If you have a higher risk for hepatitis B, you should be screened for this virus. Talk with your health care provider to find out if you are at risk for hepatitis B infection. Hepatitis C Testing is recommended for: Everyone born from [birth year] through 1965. Anyone with known risk factors for hepatitis C. Sexually transmitted infections (STIs) Get screened for STIs, including gonorrhea and chlamydia, if: You are sexually active and are younger than 71 years of age. You are older than 71 years of age and your health care provider tells you that you are at risk for this type of infection. Your sexual activity has changed since you were last screened, and  you are at increased risk for chlamydia or gonorrhea. Ask your health care provider if you are at risk. Ask your health care provider about whether you are at high risk for HIV. Your health care provider may recommend a prescription medicine to help prevent HIV infection. If you choose to take medicine to prevent HIV, you should first get tested for HIV. You should then be tested every 3 months  for as long as you are taking the medicine. Pregnancy If you are about to stop having your period (premenopausal) and you may become pregnant, seek counseling before you get pregnant. Take 400 to 800 micrograms (mcg) of folic acid every day if you become pregnant. Ask for birth control (contraception) if you want to prevent pregnancy. Osteoporosis and menopause Osteoporosis is a disease in which the bones lose minerals and strength with aging. This can result in bone fractures. If you are 66 years old or older, or if you are at risk for osteoporosis and fractures, ask your health care provider if you should: Be screened for bone loss. Take a calcium or vitamin D supplement to lower your risk of fractures. Be given hormone replacement therapy (HRT) to treat symptoms of menopause. Follow these instructions at home: Alcohol use Do not drink alcohol if: Your health care provider tells you not to drink. You are pregnant, may be pregnant, or are planning to become pregnant. If you drink alcohol: Limit how much you have to: 0-1 drink a day. Know how much alcohol is in your drink. In the [place] one drink equals one 12 oz bottle of beer (355 mL), one 5 oz glass of wine (148 mL), or one 1 oz glass of hard liquor (44 mL). Lifestyle Do not use any products that contain nicotine or tobacco. These products include cigarettes, chewing tobacco, and vaping devices, such as e-cigarettes. If you need help quitting, ask your health care provider. Do not use street drugs. Do not share needles. Ask your health care provider for help if you need support or information about quitting drugs. General instructions Schedule regular health, dental, and eye exams. Stay current with your vaccines. Tell your health care provider if: You often feel depressed. You have ever been abused or do not feel safe at home. Summary Adopting a healthy lifestyle and getting preventive care are important in promoting health and  wellness. Follow your health care provider's instructions about healthy diet, exercising, and getting tested or screened for diseases. Follow your health care provider's instructions on monitoring your cholesterol and blood pressure. This information is not intended to replace advice given to you by your health care provider. Make sure you discuss any questions you have with your health care provider. Document Revised: 06/06/2020 Document Reviewed: 06/06/2020 Elsevier Patient Education  [place].

## 2022-01-03 NOTE — Progress Notes (Signed)
Subjective:    Patient ID: Heron Nay, female    DOB: [birth year], 71 y.o.   MRN: [id number]      HPI Ximenna is here for a Physical exam.    Doing well.  No concerns    Medications and allergies reviewed with patient and updated if appropriate.  Current Outpatient Medications on File Prior to Visit  Medication Sig Dispense Refill   alendronate (FOSAMAX) 70 MG tablet TAKE 1 TABLET BY MOUTH EVERY 7 DAYS WITH A FULL GLASS OF WATER AND ON AN EMPTY STOMACH 12 tablet 3   Calcium Carbonate-Vitamin D 600-400 MG-UNIT tablet Take 1 tablet by mouth 2 (two) times daily.     Multiple Vitamins-Minerals (MULTIVITAMIN WITH MINERALS) tablet Take 1 tablet by mouth daily.     vitamin C (ASCORBIC ACID) 500 MG tablet Take 500 mg by mouth daily.     No current facility-administered medications on file prior to visit.    Review of Systems  Constitutional:  Negative for fever.  Eyes:  Negative for visual disturbance.  Respiratory:  Negative for cough, shortness of breath and wheezing.   Cardiovascular:  Negative for chest pain, palpitations and leg swelling.  Gastrointestinal:  Negative for abdominal pain, blood in stool, constipation, diarrhea and nausea.       No gerd  Genitourinary:  Negative for dysuria.  Musculoskeletal:  Negative for arthralgias and back pain.  Skin:  Negative for rash.  Neurological:  Negative for dizziness, light-headedness and headaches.  Psychiatric/Behavioral:  Negative for dysphoric mood. The patient is not nervous/anxious.        Objective:   Vitals:   01/05/22 1013  BP: 132/78  Pulse: 82  Temp: 98 F (36.7 C)  SpO2: 98%   Filed Weights   01/05/22 1013  Weight: 140 lb 3.2 oz (63.6 kg)   Body mass index is 23.33 kg/m.  BP Readings from Last 3 Encounters:  01/05/22 132/78  04/11/21 118/60  02/08/21 137/79    Wt Readings from Last 3 Encounters:  01/05/22 140 lb 3.2 oz (63.6 kg)  04/11/21 154 lb (69.9 kg)  01/03/21 152 lb (68.9 kg)        Physical Exam Constitutional: She appears well-developed and well-nourished. No distress.  HENT:  Head: Normocephalic and atraumatic.  Right Ear: External ear normal. Normal ear canal and TM Left Ear: External ear normal.  Normal ear canal and TM Mouth/Throat: Oropharynx is clear and moist.  Eyes: Conjunctivae normal.  Neck: Neck supple. No tracheal deviation present. No thyromegaly present.  No carotid bruit  Cardiovascular: Normal rate, regular rhythm and normal heart sounds.   No murmur heard.  No edema. Pulmonary/Chest: Effort normal and breath sounds normal. No respiratory distress. She has no wheezes. She has no rales.  Breast: deferred   Abdominal: Soft. She exhibits no distension. There is no tenderness.  Lymphadenopathy: She has no cervical adenopathy.  Skin: Skin is warm and dry. She is not diaphoretic.  Psychiatric: She has a normal mood and affect. Her behavior is normal.     Lab Results  Component Value Date   WBC 5.8 01/03/2021   HGB 12.7 01/03/2021   HCT 38.7 01/03/2021   PLT 201.0 01/03/2021   GLUCOSE 95 01/03/2021   CHOL 200 01/03/2021   TRIG 75.0 01/03/2021   HDL 83.00 01/03/2021   LDLDIRECT 109.5 09/22/2008   LDLCALC 102 (H) 01/03/2021   ALT 16 01/03/2021   AST 20 01/03/2021   NA 139 01/03/2021   K  4.4 01/03/2021   CL 103 01/03/2021   CREATININE 0.70 01/03/2021   BUN 15 01/03/2021   CO2 30 01/03/2021   TSH 1.80 01/03/2021   HGBA1C 5.2 01/01/2020         Assessment & Plan:   Physical exam: Screening blood work  ordered Exercise  walks 1 hr / 6 days a week Weight   normal Substance abuse  none   Reviewed recommended immunizations.   Health Maintenance  Topic Date Due   DTaP/Tdap/Td (3 - Td or Tdap) 10/16/2021   COLONOSCOPY (Pts 45-26yr Insurance coverage will need to be confirmed)  11/21/2021   DEXA SCAN  01/04/2022   COVID-19 Vaccine (4 - 2023-24 season) 01/21/2022 (Originally 09/29/2021)   Medicare Annual Wellness ([place]   04/12/2022   MAMMOGRAM  08/25/2022   Pneumonia Vaccine 71 Years old  Completed   INFLUENZA VACCINE  Completed   Hepatitis C Screening  Completed   Zoster Vaccines- Shingrix  Completed   HPV VACCINES  Aged Out          See Problem List for Assessment and Plan of chronic medical problems.

## 2022-01-05 ENCOUNTER — Ambulatory Visit (INDEPENDENT_AMBULATORY_CARE_PROVIDER_SITE_OTHER): Payer: PPO | Admitting: Internal Medicine

## 2022-01-05 VITALS — BP 132/78 | HR 82 | Temp 98.0°F | Ht 65.0 in | Wt 140.2 lb

## 2022-01-05 DIAGNOSIS — Z853 Personal history of malignant neoplasm of breast: Secondary | ICD-10-CM | POA: Diagnosis not present

## 2022-01-05 DIAGNOSIS — Z0000 Encounter for general adult medical examination without abnormal findings: Secondary | ICD-10-CM | POA: Diagnosis not present

## 2022-01-05 DIAGNOSIS — M810 Age-related osteoporosis without current pathological fracture: Secondary | ICD-10-CM | POA: Diagnosis not present

## 2022-01-05 LAB — CBC WITH DIFFERENTIAL/PLATELET
Basophils Absolute: 0 10*3/uL (ref 0.0–0.1)
Basophils Relative: 0.6 % (ref 0.0–3.0)
Eosinophils Absolute: 0.1 10*3/uL (ref 0.0–0.7)
Eosinophils Relative: 1.6 % (ref 0.0–5.0)
HCT: 39.5 % (ref 36.0–46.0)
Hemoglobin: 13.4 g/dL (ref 12.0–15.0)
Lymphocytes Relative: 32.5 % (ref 12.0–46.0)
Lymphs Abs: 1.8 10*3/uL (ref 0.7–4.0)
MCHC: 33.9 g/dL (ref 30.0–36.0)
MCV: 100.4 fl — ABNORMAL HIGH (ref 78.0–100.0)
Monocytes Absolute: 0.3 10*3/uL (ref 0.1–1.0)
Monocytes Relative: 5.9 % (ref 3.0–12.0)
Neutro Abs: 3.3 10*3/uL (ref 1.4–7.7)
Neutrophils Relative %: 59.4 % (ref 43.0–77.0)
Platelets: 241 10*3/uL (ref 150.0–400.0)
RBC: 3.94 Mil/uL (ref 3.87–5.11)
RDW: 13.9 % (ref 11.5–15.5)
WBC: 5.5 10*3/uL (ref 4.0–10.5)

## 2022-01-05 LAB — LIPID PANEL
Cholesterol: 202 mg/dL — ABNORMAL HIGH (ref 0–200)
HDL: 84.3 mg/dL (ref >39.00)
LDL Cholesterol: 107 mg/dL — ABNORMAL HIGH (ref 0–99)
NonHDL: 118.07
Total CHOL/HDL Ratio: 2
Triglycerides: 53 mg/dL (ref 0.0–149.0)
VLDL: 10.6 mg/dL (ref 0.0–40.0)

## 2022-01-05 LAB — COMPREHENSIVE METABOLIC PANEL
ALT: 19 U/L (ref 0–35)
AST: 23 U/L (ref 0–37)
Albumin: 4.5 g/dL (ref 3.5–5.2)
Alkaline Phosphatase: 55 U/L (ref 39–117)
BUN: 15 mg/dL (ref 6–23)
CO2: 28 mEq/L (ref 19–32)
Calcium: 9.2 mg/dL (ref 8.4–10.5)
Chloride: 104 mEq/L (ref 96–112)
Creatinine, Ser: 0.74 mg/dL (ref 0.40–1.20)
GFR: 81.2 mL/min (ref >60.00)
Glucose, Bld: 96 mg/dL (ref 70–99)
Potassium: 4.2 mEq/L (ref 3.5–5.1)
Sodium: 139 mEq/L (ref 135–145)
Total Bilirubin: 0.6 mg/dL (ref 0.2–1.2)
Total Protein: 6.9 g/dL (ref 6.0–8.3)

## 2022-01-05 LAB — VITAMIN D 25 HYDROXY (VIT D DEFICIENCY, FRACTURES): VITD: 79.27 ng/mL (ref 30.00–100.00)

## 2022-01-05 LAB — TSH: TSH: 1.58 u[IU]/mL (ref 0.35–5.50)

## 2022-01-05 NOTE — Assessment & Plan Note (Signed)
Chronic Continue fosamax weekly - started 01/2018 - plan to stop 01/2023 Dexa due - ordered Continue calcium and vitamin d Check vitamin d level, cbc, cmp, tsh

## 2022-01-05 NOTE — Assessment & Plan Note (Signed)
H/o breast ca Mammo up to date No evidence of recurrence

## 2022-01-10 ENCOUNTER — Ambulatory Visit (INDEPENDENT_AMBULATORY_CARE_PROVIDER_SITE_OTHER)
Admission: RE | Admit: 2022-01-10 | Discharge: 2022-01-10 | Disposition: A | Discharge status: Home / Self Care | Payer: PPO | Source: Ambulatory Visit | Attending: Internal Medicine | Admitting: Internal Medicine

## 2022-01-10 DIAGNOSIS — M810 Age-related osteoporosis without current pathological fracture: Secondary | ICD-10-CM

## 2022-02-27 ENCOUNTER — Encounter: Payer: Self-pay | Admitting: Internal Medicine

## 2022-03-03 ENCOUNTER — Encounter: Payer: Self-pay | Admitting: Emergency Medicine

## 2022-03-03 ENCOUNTER — Ambulatory Visit
Admission: EM | Admit: 2022-03-03 | Discharge: 2022-03-03 | Disposition: A | Discharge status: Home / Self Care | Payer: PPO | Attending: Nurse Practitioner | Admitting: Nurse Practitioner

## 2022-03-03 DIAGNOSIS — R21 Rash and other nonspecific skin eruption: Secondary | ICD-10-CM

## 2022-03-03 MED ORDER — TRIAMCINOLONE ACETONIDE 0.1 % EX CREA: 1.0000 | TOPICAL_CREAM | Freq: Two times a day (BID) | CUTANEOUS | 0 refills | Status: DC

## 2022-03-03 NOTE — ED Triage Notes (Signed)
Rash on right forearm that started yesterday.  Thinks she may have a spider bite on right hand x 1.5 weeks.  States rash itches

## 2022-03-03 NOTE — Discharge Instructions (Signed)
Take medication as prescribed. May also take over-the-counter Zyrtec to help with itching. Avoid hot baths or showers while symptoms persist.  Recommend taking lukewarm baths. May apply cool cloths to the area to help with itching or discomfort. Avoid scratching, rubbing, or manipulating the areas while symptoms persist. May use Aveeno colloidal oatmeal bath to use to help with drying and itching. Monitor the area for worsening, please follow-up in this clinic or with your PCP if symptoms fail to improve.

## 2022-03-03 NOTE — ED Provider Notes (Signed)
[facility]    CSN: [id number] Arrival date & time: 03/03/22  1104      History   Chief Complaint No chief complaint on file.   HPI Sherry Wells is a 72 y.o. female.   The history is provided by the patient.   Patient presents for complaints of rash to the right forearm that developed over the past 24 hours.  Patient states approximately a week and a half ago, she noticed a spider bite to the top portion of her right wrist.  She is concerned that the rash may be coming from that.  She denies any use of fever, chills, chest pain, abdominal pain, new soaps, detergents, medications, foods, lotions, or detergents.  She states she has been using Neosporin and hydrocortisone cream to the area.  Past Medical History:  Diagnosis Date   Breast cancer ([place]) 06/2013   left UOQ   Fibrocystic breast disease    Hyperlipidemia    Osteoporosis    Personal history of radiation therapy    PONV (postoperative nausea and vomiting)    S/P radiation therapy 09/08/2013-10/07/2013    1) Left breast / 40.05Gy in 15 fractions/ 2) Left Breast Boost / 10 Gy in 5 fractions    Patient Active Problem List   Diagnosis Date Noted   Overactive bladder 12/24/2018   History of breast cancer 12/05/2015   Breast cancer of upper-outer quadrant of left female breast ([place]) 05/16/2015   Breast cancer in situ 06/13/2013   Osteoporosis 09/22/2008    Past Surgical History:  Procedure Laterality Date   BREAST BIOPSY Left 2015   BREAST CYST ASPIRATION Left 1980   BREAST EXCISIONAL BIOPSY Left 1985   BREAST LUMPECTOMY  1980   cyst   BREAST LUMPECTOMY Left 07/13/2013   COLONOSCOPY  2002 & 2013   negative; [facility]   DILATION AND CURETTAGE OF UTERUS     x2   TUBAL LIGATION  1994    OB History   No obstetric history on file.      Home Medications    Prior to Admission medications   Medication Sig Start Date End Date Taking? Authorizing Provider  triamcinolone cream (KENALOG) 0.1 %  Apply 1 Application topically 2 (two) times daily. 03/03/22  Yes Kebin Maye-Warren, Alda Lea, NP  alendronate (FOSAMAX) 70 MG tablet TAKE 1 TABLET BY MOUTH EVERY 7 DAYS WITH A FULL GLASS OF WATER AND ON AN EMPTY STOMACH 12/26/21   Binnie Rail, MD  Calcium Carbonate-Vitamin D 600-400 MG-UNIT tablet Take 1 tablet by mouth 2 (two) times daily.    [provider]  Multiple Vitamins-Minerals (MULTIVITAMIN WITH MINERALS) tablet Take 1 tablet by mouth daily.    [provider]  vitamin C (ASCORBIC ACID) 500 MG tablet Take 500 mg by mouth daily.    [provider]    Family History Family History  Problem Relation Age of Onset   Diabetes Mother    Transient ischemic attack Mother        in 44s   Hypertension Mother    Valvular heart disease Father        Aortic valve   Osteoporosis Sister    Cardiomyopathy Sister    Throat cancer Maternal Uncle    Cancer Maternal Uncle        ? primary   Valvular heart disease Paternal Uncle        Aortic valve    Social History Social History   Tobacco Use  Smoking status: Never   Smokeless tobacco: Never  Vaping Use   Vaping Use: Never used  Substance Use Topics   Alcohol use: No   Drug use: No     Allergies   Patient has no known allergies.   Review of Systems Review of Systems Per HPI  Physical Exam Triage Vital Signs ED Triage Vitals  Enc Vitals Group     BP 03/03/22 1155 (!) 145/74     Pulse Rate 03/03/22 1155 70     Resp 03/03/22 1155 16     Temp 03/03/22 1155 97.9 F (36.6 C)     Temp Source 03/03/22 1155 Oral     SpO2 03/03/22 1155 98 %     Weight --      Height --      Head Circumference --      Peak Flow --      Pain Score 03/03/22 1156 0     Pain Loc --      Pain Edu? --      Excl. in [place]? --    No data found.  Updated Vital Signs BP (!) 145/74 (BP Location: Right Arm)   Pulse 70   Temp 97.9 F (36.6 C) (Oral)   Resp 16   SpO2 98%   Visual Acuity Right Eye Distance:   Left Eye  Distance:   Bilateral Distance:    Right Eye Near:   Left Eye Near:    Bilateral Near:     Physical Exam Vitals and nursing note reviewed.  Constitutional:      Appearance: Normal appearance.  HENT:     Head: Normocephalic.  Eyes:     Extraocular Movements: Extraocular movements intact.     Pupils: Pupils are equal, round, and reactive to light.  Cardiovascular:     Rate and Rhythm: Normal rate and regular rhythm.     Pulses: Normal pulses.     Heart sounds: Normal heart sounds.  Pulmonary:     Effort: Pulmonary effort is normal. No respiratory distress.     Breath sounds: Normal breath sounds. No stridor. No wheezing, rhonchi or rales.  Musculoskeletal:     Cervical back: Normal range of motion.  Skin:    General: Skin is warm and dry.     Findings: Rash present.     Comments: Erythematous maculopapular rash noted to the medial aspect of the right forearm.  Rash measures approximately 4 cm in diameter.  There is no oozing, fluctuance, or drainage present.  Neurological:     General: No focal deficit present.     Mental Status: She is alert and oriented to person, place, and time.  Psychiatric:        Mood and Affect: Mood normal.        Behavior: Behavior normal.      UC Treatments / Results  Labs (all labs ordered are listed, but only abnormal results are displayed) Labs Reviewed - No data to display  EKG   Radiology No results found.  Procedures Procedures (including critical care time)  Medications Ordered in UC Medications - No data to display  Initial Impression / Assessment and Plan / UC Course  I have reviewed the triage vital signs and the nursing notes.  Pertinent labs & imaging results that were available during my care of the patient were reviewed by me and considered in my medical decision making (see chart for details).  The patient is well-appearing, she is in no acute distress,  vital signs are stable.  Suspect a possible contact  dermatitis.  Cannot ascertain the exact cause of the patient's symptoms as she has not had any new exposures.  Will treat symptomatically with triamcinolone cream 0.1%.  Supportive care recommendations were provided to the patient to include keeping the area clean and dry, and avoiding scratching or manipulating the area while symptoms persist.  Patient was also advised she could take over-the-counter Benadryl or Zyrtec as needed for itching.  Patient advised to monitor the area for worsening.  Patient verbalizes understanding.  All questions were answered.  Patient stable for discharge.   Final Clinical Impressions(s) / UC Diagnoses   Final diagnoses:  Rash and nonspecific skin eruption     Discharge Instructions      Take medication as prescribed. May also take over-the-counter Zyrtec to help with itching. Avoid hot baths or showers while symptoms persist.  Recommend taking lukewarm baths. May apply cool cloths to the area to help with itching or discomfort. Avoid scratching, rubbing, or manipulating the areas while symptoms persist. May use Aveeno colloidal oatmeal bath to use to help with drying and itching. Monitor the area for worsening, please follow-up in this clinic or with your PCP if symptoms fail to improve.     ED Prescriptions     Medication Sig Dispense Auth. Provider   triamcinolone cream (KENALOG) 0.1 % Apply 1 Application topically 2 (two) times daily. 30 g Donnavan Covault-Warren, Alda Lea, NP      PDMP not reviewed this encounter.   Tish Men, NP 03/03/22 1214

## 2022-03-26 ENCOUNTER — Ambulatory Visit (AMBULATORY_SURGERY_CENTER): Payer: PPO

## 2022-03-26 VITALS — Ht 65.0 in | Wt 140.0 lb

## 2022-03-26 DIAGNOSIS — Z1211 Encounter for screening for malignant neoplasm of colon: Secondary | ICD-10-CM

## 2022-03-26 MED ORDER — NA SULFATE-K SULFATE-MG SULF 17.5-3.13-1.6 GM/177ML PO SOLN: 1.0000 | Freq: Once | ORAL | 0 refills | Status: AC

## 2022-03-26 NOTE — Progress Notes (Signed)
Pre visit completed via phone call; Patient verified name, DOB, and address;  No egg or soy allergy known to patient  No issues known to pt with past sedation with any surgeries or procedures---other than PONV; Patient denies ever being told they had issues or difficulty with intubation----other than PONV;  No FH of Malignant Hyperthermia; Pt is not on diet pills Pt is not on home 02  Pt is not on blood thinners  Pt denies issues with constipation;  No A fib or A flutter Have any cardiac testing pending--[place] Pt instructed to use Singlecare.com or GoodRx for a price reduction on prep   Insurance verified during [place] appt=[employer] Medicare  Patient's chart reviewed by Osvaldo Angst CNRA prior to previsit and patient appropriate for the [place].  Previsit completed and red dot placed by patient's name on their procedure day (on provider's schedule).

## 2022-03-28 ENCOUNTER — Encounter: Payer: Self-pay | Admitting: Internal Medicine

## 2022-04-16 ENCOUNTER — Encounter: Payer: Self-pay | Admitting: Internal Medicine

## 2022-04-16 ENCOUNTER — Ambulatory Visit (AMBULATORY_SURGERY_CENTER): Payer: PPO | Admitting: Internal Medicine

## 2022-04-16 VITALS — BP 140/65 | HR 56 | Temp 97.5°F | Resp 12 | Ht 65.0 in | Wt 140.0 lb

## 2022-04-16 DIAGNOSIS — K635 Polyp of colon: Secondary | ICD-10-CM | POA: Diagnosis not present

## 2022-04-16 DIAGNOSIS — Z1211 Encounter for screening for malignant neoplasm of colon: Secondary | ICD-10-CM | POA: Diagnosis not present

## 2022-04-16 DIAGNOSIS — D120 Benign neoplasm of cecum: Secondary | ICD-10-CM | POA: Diagnosis not present

## 2022-04-16 DIAGNOSIS — D122 Benign neoplasm of ascending colon: Secondary | ICD-10-CM | POA: Diagnosis not present

## 2022-04-16 DIAGNOSIS — D125 Benign neoplasm of sigmoid colon: Secondary | ICD-10-CM | POA: Diagnosis not present

## 2022-04-16 MED ORDER — SODIUM CHLORIDE 0.9 % IV SOLN: 500.0000 mL | Freq: Once | INTRAVENOUS | Status: DC

## 2022-04-16 NOTE — Progress Notes (Signed)
Pt's states no medical or surgical changes since previsit or office visit. 

## 2022-04-16 NOTE — Progress Notes (Signed)
To pacu, VSS. Report to [email or web address] 

## 2022-04-16 NOTE — Op Note (Signed)
[place] Patient Name: Sherry Wells Procedure Date: 04/16/2022 12:01 PM MRN: [id number] Endoscopist: Jerene Bears , MD, [id number] Age: 72 Referring MD:  Date of Birth: 1950/04/27 Gender: Female Account #: [id number] Procedure:                Colonoscopy Indications:              Screening for colorectal malignant neoplasm, Last                            colonoscopy 10 years ago Medicines:                Monitored Anesthesia Care Procedure:                Pre-Anesthesia Assessment:                           - Prior to the procedure, a History and Physical                            was performed, and patient medications and                            allergies were reviewed. The patient's tolerance of                            previous anesthesia was also reviewed. The risks                            and benefits of the procedure and the sedation                            options and risks were discussed with the patient.                            All questions were answered, and informed consent                            was obtained. Prior Anticoagulants: The patient has                            taken no anticoagulant or antiplatelet agents. ASA                            Grade Assessment: II - A patient with mild systemic                            disease. After reviewing the risks and benefits,                            the patient was deemed in satisfactory condition to                            undergo the procedure.  After obtaining informed consent, the colonoscope                            was passed under direct vision. Throughout the                            procedure, the patient's blood pressure, pulse, and                            oxygen saturations were monitored continuously. The                            Olympus PCF-H190DL [id number]) Colonoscope was                            introduced through the anus and  advanced to the                            cecum, identified by appendiceal orifice and                            ileocecal valve. The colonoscopy was performed                            without difficulty. The patient tolerated the                            procedure well. The quality of the bowel                            preparation was good. The ileocecal valve,                            appendiceal orifice, and rectum were photographed. Scope In: 12:13:18 PM Scope Out: 12:29:15 PM Scope Withdrawal Time: 0 hours 12 minutes 38 seconds  Total Procedure Duration: 0 hours 15 minutes 57 seconds  Findings:                 The digital rectal exam was normal.                           A 3 mm polyp was found in the cecum. The polyp was                            sessile. The polyp was removed with a cold snare.                            Resection and retrieval were complete.                           A 4 mm polyp was found in the ascending colon. The                            polyp was sessile. The polyp  was removed with a                            cold snare. Resection and retrieval were complete.                           A 3 mm polyp was found in the sigmoid colon. The                            polyp was sessile. The polyp was removed with a                            cold snare. Resection and retrieval were complete.                           Multiple small-mouthed diverticula were found in                            the sigmoid colon.                           Internal hemorrhoids were found during                            retroflexion. The hemorrhoids were small. Complications:            No immediate complications. Estimated Blood Loss:     Estimated blood loss was minimal. Impression:               - One 3 mm polyp in the cecum, removed with a cold                            snare. Resected and retrieved.                           - One 4 mm polyp in the ascending colon,  removed                            with a cold snare. Resected and retrieved.                           - One 3 mm polyp in the sigmoid colon, removed with                            a cold snare. Resected and retrieved.                           - Mild diverticulosis in the sigmoid colon.                           - Small internal hemorrhoids. Recommendation:           - Patient has a contact number available for  emergencies. The signs and symptoms of potential                            delayed complications were discussed with the                            patient. Return to normal activities tomorrow.                            Written discharge instructions were provided to the                            patient.                           - Resume previous diet.                           - Continue present medications.                           - Await pathology results.                           - Repeat colonoscopy is recommended. The                            colonoscopy date will be determined after pathology                            results from today's exam become available for                            review. Jerene Bears, MD 04/16/2022 12:33:40 PM This report has been signed electronically.

## 2022-04-16 NOTE — Patient Instructions (Signed)
Thank you for coming in to see [place] today! Resume a regular diet today as well as daily medications / supplements. Return to regular daily activities tomorrow, 04/17/22. Biopsy results will be available in 1-2 weeks with recommendation date of next colonoscopy.   YOU HAD AN ENDOSCOPIC PROCEDURE TODAY AT [facility]:   Refer to the procedure report that was given to you for any specific questions about what was found during the examination.  If the procedure report does not answer your questions, please call your gastroenterologist to clarify.  If you requested that your care partner not be given the details of your procedure findings, then the procedure report has been included in a sealed envelope for you to review at your convenience later.  YOU SHOULD EXPECT: Some feelings of bloating in the abdomen. Passage of more gas than usual.  Walking can help get rid of the air that was put into your GI tract during the procedure and reduce the bloating. If you had a lower endoscopy (such as a colonoscopy or flexible sigmoidoscopy) you may notice spotting of blood in your stool or on the toilet paper. If you underwent a bowel prep for your procedure, you may not have a normal bowel movement for a few days.  Please Note:  You might notice some irritation and congestion in your nose or some drainage.  This is from the oxygen used during your procedure.  There is no need for concern and it should clear up in a day or so.  SYMPTOMS TO REPORT IMMEDIATELY:  Following lower endoscopy (colonoscopy or flexible sigmoidoscopy):  Excessive amounts of blood in the stool  Significant tenderness or worsening of abdominal pains  Swelling of the abdomen that is new, acute  Fever of 100F or higher    For urgent or emergent issues, a gastroenterologist can be reached at any hour by calling [phone]. Do not use MyChart messaging for urgent concerns.    DIET:  We do recommend a small meal at  first, but then you may proceed to your regular diet.  Drink plenty of fluids but you should avoid alcoholic beverages for 24 hours.  ACTIVITY:  You should plan to take it easy for the rest of today and you should NOT DRIVE or use heavy machinery until tomorrow (because of the sedation medicines used during the test).    FOLLOW UP: Our staff will call the number listed on your records the next business day following your procedure.  We will call around 7:15- 8:00 am to check on you and address any questions or concerns that you may have regarding the information given to you following your procedure. If we do not reach you, we will leave a message.     If any biopsies were taken you will be contacted by phone or by letter within the next 1-3 weeks.  Please call us at [phone] if you have not heard about the biopsies in 3 weeks.    SIGNATURES/CONFIDENTIALITY: You and/or your care partner have signed paperwork which will be entered into your electronic medical record.  These signatures attest to the fact that that the information above on your After Visit Summary has been reviewed and is understood.  Full responsibility of the confidentiality of this discharge information lies with you and/or your care-partner.

## 2022-04-16 NOTE — Progress Notes (Signed)
GASTROENTEROLOGY PROCEDURE H&P NOTE   Primary Care Physician: Binnie Rail, MD    Reason for Procedure:   Colon cancer screening  Plan:    colonoscopy  Patient is appropriate for endoscopic procedure(s) in the ambulatory ([place]) setting.  The nature of the procedure, as well as the risks, benefits, and alternatives were carefully and thoroughly reviewed with the patient. Ample time for discussion and questions allowed. The patient understood, was satisfied, and agreed to proceed.     HPI: Sherry Wells is a 72 y.o. female who presents for colonoscopy.  Medical history as below.  Tolerated the prep.  No recent chest pain or shortness of breath.  No abdominal pain today.  Past Medical History:  Diagnosis Date   Breast cancer ([place]) 06/2013   LEFT   Fibrocystic breast disease    Hyperlipidemia    Osteoporosis    on meds   Personal history of radiation therapy    PONV (postoperative nausea and vomiting)    S/P radiation therapy 09/08/2013-10/07/2013    1) Left breast / 40.05Gy in 15 fractions/ 2) Left Breast Boost / 10 Gy in 5 fractions    Past Surgical History:  Procedure Laterality Date   BREAST BIOPSY Left 2015   BREAST CYST ASPIRATION Left 1980   BREAST EXCISIONAL BIOPSY Left 1985   BREAST LUMPECTOMY  1980   cyst   BREAST LUMPECTOMY Left 07/13/2013   COLONOSCOPY  2002 & 2013   negative; [facility]   DILATION AND CURETTAGE OF UTERUS     x2   TUBAL LIGATION  1994    Prior to Admission medications   Medication Sig Start Date End Date Taking? Authorizing Provider  alendronate (FOSAMAX) 70 MG tablet TAKE 1 TABLET BY MOUTH EVERY 7 DAYS WITH A FULL GLASS OF WATER AND ON AN EMPTY STOMACH 12/26/21  Yes Burns, Claudina Lick, MD  Calcium Carbonate-Vitamin D 600-400 MG-UNIT tablet Take 1 tablet by mouth 2 (two) times daily.   Yes [provider]  Multiple Vitamins-Minerals (MULTIVITAMIN WITH MINERALS) tablet Take 1 tablet by mouth daily.   Yes [provider]  vitamin C (ASCORBIC ACID) 500 MG tablet Take 500 mg by mouth daily.   Yes [provider]    Current Outpatient Medications  Medication Sig Dispense Refill   alendronate (FOSAMAX) 70 MG tablet TAKE 1 TABLET BY MOUTH EVERY 7 DAYS WITH A FULL GLASS OF WATER AND ON AN EMPTY STOMACH 12 tablet 3   Calcium Carbonate-Vitamin D 600-400 MG-UNIT tablet Take 1 tablet by mouth 2 (two) times daily.     Multiple Vitamins-Minerals (MULTIVITAMIN WITH MINERALS) tablet Take 1 tablet by mouth daily.     vitamin C (ASCORBIC ACID) 500 MG tablet Take 500 mg by mouth daily.     Current Facility-Administered Medications  Medication Dose Route Frequency Provider Last Rate Last Admin   0.9 %  sodium chloride infusion  500 mL Intravenous Once Yue Flanigan, Lajuan Lines, MD        Allergies as of 04/16/2022   (No Known Allergies)    Family History  Problem Relation Age of Onset   Diabetes Mother    Transient ischemic attack Mother        in 73s   Hypertension Mother    Valvular heart disease Father        Aortic valve   Osteoporosis Sister    Cardiomyopathy Sister    Throat cancer Maternal Uncle    Cancer Maternal Uncle        ?  primary   Valvular heart disease Paternal Uncle        Aortic valve   Colon cancer Neg Hx    Esophageal cancer Neg Hx    Ulcerative colitis Neg Hx    Stomach cancer Neg Hx    Rectal cancer Neg Hx     Social History   Socioeconomic History   Marital status: Married    Spouse name: Not on file   Number of children: Not on file   Years of education: Not on file   Highest education level: Not on file  Occupational History   Occupation: retired  Tobacco Use   Smoking status: Never   Smokeless tobacco: Never  Vaping Use   Vaping Use: Never used  Substance and Sexual Activity   Alcohol use: No   Drug use: No   Sexual activity: Yes    Birth control/protection: Post-menopausal    Comment: meanarche 34, G82, menopause age 75, no HRT  Other Topics Concern   Not on  file  Social History Narrative   Not on file   Social Determinants of Health   Financial Resource Strain: Low Risk  (04/11/2021)   Overall Financial Resource Strain (CARDIA)    Difficulty of Paying Living Expenses: Not hard at all  Food Insecurity: No Food Insecurity (04/11/2021)   Hunger Vital Sign    Worried About Running Out of Food in the Last Year: Never true    Ran Out of Food in the Last Year: Never true  Transportation Needs: No Transportation Needs (04/11/2021)   PRAPARE - Hydrologist (Medical): No    Lack of Transportation (Non-Medical): No  Physical Activity: Sufficiently Active (04/11/2021)   Exercise Vital Sign    Days of Exercise per Week: 6 days    Minutes of Exercise per Session: 60 min  Stress: No Stress Concern Present (04/11/2021)   [place]    Feeling of Stress : Not at all  Social Connections: [place] (04/11/2021)   Social Connection and Isolation Panel [NHANES]    Frequency of Communication with Friends and Family: More than three times a week    Frequency of Social Gatherings with Friends and Family: More than three times a week    Attends Religious Services: More than 4 times per year    Active Member of [employer] or Organizations: Yes    Attends Archivist Meetings: More than 4 times per year    Marital Status: Married  Human resources officer Violence: Not At Risk (04/11/2021)   Humiliation, Afraid, Rape, and Kick questionnaire    Fear of Current or Ex-Partner: No    Emotionally Abused: No    Physically Abused: No    Sexually Abused: No    Physical Exam: Vital signs in last 24 hours: @BP  122/60   Pulse 62   Temp (!) 97.5 F (36.4 C) (Temporal)   Ht 5\' 5"  (1.651 m)   Wt 140 lb (63.5 kg)   SpO2 100%   BMI 23.30 kg/m  GEN: NAD EYE: Sclerae anicteric ENT: MMM CV: Non-tachycardic Pulm: CTA b/l GI: Soft, NT/ND NEURO:  Alert & Oriented x  3   Zenovia Jarred, MD [facility]  04/16/2022 12:01 PM

## 2022-04-17 ENCOUNTER — Telehealth: Payer: Self-pay

## 2022-04-17 NOTE — Telephone Encounter (Signed)
Follow up call to pt, lm for pt to call if having any difficulty with normal activities or eating and drinking.  Also to call if any other questions or concerns.  

## 2022-04-18 ENCOUNTER — Telehealth: Payer: Self-pay | Admitting: Internal Medicine

## 2022-04-18 NOTE — Telephone Encounter (Signed)
Contacted [name] to schedule their annual wellness visit. Appointment made for 04/19/22.  Sherry Wells AWV direct phone # [phone]

## 2022-04-19 ENCOUNTER — Encounter: Payer: Self-pay | Admitting: Internal Medicine

## 2022-04-19 ENCOUNTER — Telehealth (INDEPENDENT_AMBULATORY_CARE_PROVIDER_SITE_OTHER): Payer: PPO | Admitting: Family Medicine

## 2022-04-19 DIAGNOSIS — Z0000 Encounter for general adult medical examination without abnormal findings: Secondary | ICD-10-CM | POA: Diagnosis not present

## 2022-04-19 NOTE — Patient Instructions (Signed)
I really enjoyed getting to talk with you today! I am available on Tuesdays and Thursdays for virtual visits if you have any questions or concerns, or if I can be of any further assistance.   CHECKLIST FROM ANNUAL WELLNESS VISIT:  -Follow up (please call to schedule if not scheduled after visit):   -yearly for annual wellness visit with primary care office  Here is a list of your preventive care/health maintenance measures and the plan for each if any are due:  Health Maintenance  Topic Date Due   COVID-19 Vaccine (4 - 2023-24 season) 05/05/2027 (Originally 09/29/2021) - declined per patient preference   MAMMOGRAM  08/25/2022   Medicare Annual Wellness (AWV)  04/19/2023   DEXA SCAN  01/11/2024   DTaP/Tdap/Td (4 - Td or Tdap) 02/28/2032   COLONOSCOPY (Pts 45-15yrs Insurance coverage will need to be confirmed)  04/15/2032   Pneumonia Vaccine 50+ Years old  Completed   INFLUENZA VACCINE  Completed   Hepatitis C Screening  Completed   Zoster Vaccines- Shingrix  Completed   HPV VACCINES  Aged Out    -See a dentist at least yearly  -Get your eyes checked and then per your eye specialist's recommendations  -Other issues addressed today:  -I have included below further information regarding a healthy whole foods based diet, physical activity guidelines for adults, stress management and opportunities for social connections. I hope you find this information useful.   -----------------------------------------------------------------------------------------------------------------------------------------------------------------------------------------------------------------------------------------------------------  NUTRITION: -eat real food: lots of colorful vegetables (half the plate) and fruits -5-7 servings of vegetables and fruits per day (fresh or steamed is best), exp. 2 servings of vegetables with lunch and dinner and 2 servings of fruit per day. Berries and greens such as kale and  collards are great choices.  -consume on a regular basis: whole grains (make sure first ingredient on label contains the word "whole"), fresh fruits, fish, nuts, seeds, healthy oils (such as olive oil, avocado oil, grape seed oil) -may eat small amounts of dairy and lean meat on occasion, but avoid processed meats such as ham, bacon, lunch meat, etc. -drink water -try to avoid fast food and pre-packaged foods, processed meat -most experts advise limiting sodium to < 2300mg  per day, should limit further is any chronic conditions such as high blood pressure, heart disease, diabetes, etc. The American Heart Association advised that < 1500mg  is is ideal -try to avoid foods that contain any ingredients with names you do not recognize  -try to avoid sugar/sweets (except for the natural sugar that occurs in fresh fruit) -try to avoid sweet drinks -try to avoid white rice, white bread, pasta (unless whole grain), white or yellow potatoes  EXERCISE GUIDELINES FOR ADULTS: -if you wish to increase your physical activity, do so gradually and with the approval of your doctor -STOP and seek medical care immediately if you have any chest pain, chest discomfort or trouble breathing when starting or increasing exercise  -move and stretch your body, legs, feet and arms when sitting for long periods -Physical activity guidelines for optimal health in adults: -least 150 minutes per week of aerobic exercise (can talk, but not sing) once approved by your doctor, 20-30 minutes of sustained activity or two 10 minute episodes of sustained activity every day.  -resistance training at least 2 days per week if approved by your doctor -balance exercises 3+ days per week:   Stand somewhere where you have something sturdy to hold onto if you lose balance.    1) lift up  on toes, start with 5x per day and work up to 20x   2) stand and lift on leg straight out to the side so that foot is a few inches of the floor, start with 5x  each side and work up to 20x each side   3) stand on one foot, start with 5 seconds each side and work up to 20 seconds on each side  If you need ideas or help with getting more active:  -Silver sneakers https://tools.silversneakers.com  -Walk with a Doc: http://stephens-thompson.biz/  -try to include resistance (weight lifting/strength building) and balance exercises twice per week: or the following link for ideas: ChessContest.fr  UpdateClothing.com.cy  STRESS MANAGEMENT: -can try meditating, or just sitting quietly with deep breathing while intentionally relaxing all parts of your body for 5 minutes daily -if you need further help with stress, anxiety or depression please follow up with your primary doctor or contact the wonderful folks at [place]: [place]: -options in [place] if you wish to engage in more social and exercise related activities:  -Silver sneakers https://tools.silversneakers.com  -Walk with a Doc: http://stephens-thompson.biz/  -Check out the [place] 50+ section on the [place] [employer] (hiking clubs, book clubs, cards and games, chess, exercise classes, aquatic classes and much more) - see the website for details: https://www.[employer].gov/departments/parks-recreation/active-adults50  -YouTube has lots of exercise videos for different ages and abilities as well  -[place] (a variety of indoor and outdoor inperson activities for adults). ([phone])157-7180. [address].  -Virtual Online Classes (a variety of topics): see seniorplanet.org or call [phone]  -consider volunteering at a school, hospice center, church, senior center or elsewhere

## 2022-04-19 NOTE — Progress Notes (Signed)
PATIENT CHECK-IN and HEALTH RISK ASSESSMENT QUESTIONNAIRE:  -completed by phone/video for upcoming Medicare Preventive Visit  Pre-Visit Check-in: 1)Vitals (height, wt, BP, etc) - record in vitals section for visit on day of visit 2)Review and Update Medications, Allergies PMH, Surgeries, Social history in Epic 3)Hospitalizations in the last year with date/reason? No 4)Review and Update Care Team (patient's specialists) in Epic 5) Complete PHQ9 in Epic  6) Complete Fall Screening in Epic 7)Review all Health Maintenance Due and order under PCP if not done.  8)Medicare Wellness Questionnaire: Answer theses question about your habits: Do you drink alcohol? No  If yes, how many drinks do you have a day?No  Have you ever smoked?No  Quit date if applicable?  N/A How many packs a day do/did you smoke? N/A Do you use smokeless tobacco?No Do you use an illicit drugs?No Do you exercises? Yes   IF so, what type and how many days/minutes per week?Walk, 6 days per week at 1 hours Are you sexually active? No Number of partners? N/A Typical breakfast: Cereal, Orange juice, pancakes, bacon Typical lunch: sandwich Typical dinner: salmon, chicken, beef, vegetables Typical snacks:popcorn, nut  Beverages: tea, water, orange juice for breakfast  Answer theses question about you: Can you perform most household chores?Yes  Do you find it hard to follow a conversation in a noisy room?No  Do you often ask people to speak up or repeat themselves?No Do you feel that you have a problem with memory?No  Do you balance your checkbook and or bank acounts?Yes Do you feel safe at home?Yes  Last dentist visit?Oct 2023 Do you need assistance with any of the following: Please note if so   Driving?-No  Feeding yourself?-No   Getting from bed to chair?-No   Getting to the toilet?-No   Bathing or showering?-No   Dressing yourself?-No   Managing money?-No   Climbing a flight of stairs-No   Preparing meals?-No    Do you have Advanced Directives in place (Living Will, Healthcare Power or Attorney)?  Yes   Last eye Exam and location?Sept 2023, Dr. Satira Sark.  [facility]   Do you currently use prescribed or non-prescribed narcotic or opioid pain medications?No   Do you have a history or close family history of breast, ovarian, tubal or peritoneal cancer or a family member with BRCA (breast cancer susceptibility 1 and 2) gene mutations?  Nurse/Assistant Credentials/time stamp:St    ----------------------------------------------------------------------------------------------------------------------------------------------------------------------------------------------------------------------   MEDICARE ANNUAL PREVENTIVE VISIT WITH PROVIDER: (Welcome to Medicare, initial annual wellness or annual wellness exam)  Virtual Visit via Video Note  I connected with Mahaley Ajah Bail on 04/19/22 by  a video enabled telemedicine application and verified that I am speaking with the correct person using two identifiers.  Location patient: home Location provider:work or home office Persons participating in the virtual visit: patient, provider  Concerns and/or follow up today: everything is stable, completed her colonoscopy earlier this week.    See HM section in Epic for other details of completed HM.    ROS: negative for report of fevers, unintentional weight loss, vision changes, vision loss, hearing loss or change, chest pain, sob, hemoptysis, melena, hematochezia, hematuria, falls, bleeding or bruising, loc, thoughts of suicide or self harm, memory loss  Patient-completed extensive health risk assessment - reviewed and discussed with the patient: See Health Risk Assessment completed with patient prior to the visit either above or in recent phone note. This was reviewed in detailed with the patient today and appropriate recommendations, orders and referrals were  placed as needed per  Summary below and patient instructions.   Review of Medical History: -PMH, PSH, Family History and current specialty and care providers reviewed and updated and listed below   Patient Care Team: Binnie Rail, MD as PCP - General (Internal Medicine) Marygrace Drought, MD as Consulting Physician (Ophthalmology)   Past Medical History:  Diagnosis Date   Breast cancer [facility]) 06/2013   LEFT   Fibrocystic breast disease    Hyperlipidemia    Osteoporosis    on meds   Personal history of radiation therapy    PONV (postoperative nausea and vomiting)    S/P radiation therapy 09/08/2013-10/07/2013    1) Left breast / 40.05Gy in 15 fractions/ 2) Left Breast Boost / 10 Gy in 5 fractions    Past Surgical History:  Procedure Laterality Date   BREAST BIOPSY Left 2015   BREAST CYST ASPIRATION Left 1980   BREAST EXCISIONAL BIOPSY Left 1985   BREAST LUMPECTOMY  1980   cyst   BREAST LUMPECTOMY Left 07/13/2013   COLONOSCOPY  2002 & 2013   negative; [facility]   DILATION AND CURETTAGE OF UTERUS     x2   TUBAL LIGATION  1994    Social History   Socioeconomic History   Marital status: Married    Spouse name: Not on file   Number of children: Not on file   Years of education: Not on file   Highest education level: Not on file  Occupational History   Occupation: retired  Tobacco Use   Smoking status: Never   Smokeless tobacco: Never  Vaping Use   Vaping Use: Never used  Substance and Sexual Activity   Alcohol use: No   Drug use: No   Sexual activity: Yes    Birth control/protection: Post-menopausal    Comment: meanarche 39, G15, menopause age 82, no HRT  Other Topics Concern   Not on file  Social History Narrative   Not on file   Social Determinants of Health   Financial Resource Strain: [place]  (04/11/2021)   Overall Financial Resource Strain (CARDIA)    Difficulty of Paying Living Expenses: Not hard at all  Food Insecurity: No Food Insecurity (04/11/2021)   Hunger Vital Sign     Worried About Running Out of Food in the Last Year: Never true    [place] in the Last Year: Never true  Transportation Needs: No Transportation Needs (04/11/2021)   PRAPARE - Hydrologist (Medical): No    Lack of Transportation (Non-Medical): No  Physical Activity: Sufficiently Active (04/11/2021)   Exercise Vital Sign    Days of Exercise per Week: 6 days    Minutes of Exercise per Session: 60 min  Stress: No Stress Concern Present (04/11/2021)   [place]    Feeling of Stress : Not at all  Social Connections: [place] (04/11/2021)   Social Connection and Isolation Panel [NHANES]    Frequency of Communication with Friends and Family: More than three times a week    Frequency of Social Gatherings with Friends and Family: More than three times a week    Attends Religious Services: More than 4 times per year    Active Member of [employer] or Organizations: Yes    Attends Archivist Meetings: More than 4 times per year    Marital Status: Married  Human resources officer Violence: Not At Risk (04/11/2021)   Humiliation,  Afraid, Rape, and Kick questionnaire    Fear of Current or Ex-Partner: No    Emotionally Abused: No    Physically Abused: No    Sexually Abused: No    Family History  Problem Relation Age of Onset   Diabetes Mother    Transient ischemic attack Mother        in 11s   Hypertension Mother    Valvular heart disease Father        Aortic valve   Osteoporosis Sister    Cardiomyopathy Sister    Throat cancer Maternal Uncle    Cancer Maternal Uncle        ? primary   Valvular heart disease Paternal Uncle        Aortic valve   Colon cancer Neg Hx    Esophageal cancer Neg Hx    Ulcerative colitis Neg Hx    Stomach cancer Neg Hx    Rectal cancer Neg Hx     Current Outpatient Medications on File Prior to Visit  Medication Sig Dispense Refill   alendronate  (FOSAMAX) 70 MG tablet TAKE 1 TABLET BY MOUTH EVERY 7 DAYS WITH A FULL GLASS OF WATER AND ON AN EMPTY STOMACH 12 tablet 3   Calcium Carbonate-Vitamin D 600-400 MG-UNIT tablet Take 1 tablet by mouth 2 (two) times daily.     Multiple Vitamins-Minerals (MULTIVITAMIN WITH MINERALS) tablet Take 1 tablet by mouth daily.     vitamin C (ASCORBIC ACID) 500 MG tablet Take 500 mg by mouth daily.     No current facility-administered medications on file prior to visit.    No Known Allergies     Physical Exam There were no vitals filed for this visit. Estimated body mass index is 23.3 kg/m as calculated from the following:   Height as of 04/16/22: 5\' 5"  (1.651 m).   Weight as of 04/16/22: 140 lb (63.5 kg).  EKG (optional): deferred due to virtual visit  GENERAL: alert, oriented, no acute distress detected, full vision exam deferred due to pandemic and/or virtual encounter  HEENT: atraumatic, conjunttiva clear, no obvious abnormalities on inspection of external nose and ears  NECK: normal movements of the head and neck  LUNGS: on inspection no signs of respiratory distress, breathing rate appears normal, no obvious gross SOB, gasping or wheezing  CV: no obvious cyanosis  MS: moves all visible extremities without noticeable abnormality  PSYCH/NEURO: pleasant and cooperative, no obvious depression or anxiety, speech and thought processing grossly intact, Cognitive function grossly intact  Flowsheet Row Video Visit from 04/19/2022 in [place] [facility]  PHQ-9 Total Score 0           04/19/2022    9:47 AM 01/05/2022   10:19 AM 04/11/2021   11:46 AM 04/01/2020   11:26 AM 01/01/2020    8:43 AM  Depression screen PHQ 2/9  Decreased Interest 0 0 0 0 0  Down, Depressed, Hopeless 0 0 0 0 0  PHQ - 2 Score 0 0 0 0 0  Altered sleeping 0 0     Tired, decreased energy 0 0     Change in appetite 0 0     Feeling bad or failure about yourself  0 0     Trouble concentrating 0 0      Moving slowly or fidgety/restless 0 0     Suicidal thoughts 0 0     PHQ-9 Score 0 0     Difficult doing work/chores Not difficult at all  Not difficult at all          09/07/2020    8:54 AM 02/06/2021   11:29 AM 04/11/2021   11:49 AM 01/05/2022   10:19 AM 04/19/2022    9:47 AM  [place] in the past year?   0 0 0  Was there an injury with Fall?   0 0 0  Fall Risk Category Calculator   0 0 0  Fall Risk Category (Retired)   Low Low   (RETIRED) Patient Fall Risk Level Low fall risk Low fall risk  Low fall risk   Patient at Risk for Falls Due to   No Fall Risks No Fall Risks   Fall risk Follow up   Falls evaluation completed Falls evaluation completed      SUMMARY AND PLAN:  Encounter for Medicare annual wellness exam   Discussed applicable health maintenance/preventive health measures and advised and referred or ordered per patient preferences:  Health Maintenance  Topic Date Due   COVID-19 Vaccine (4 - 2023-24 season) 05/05/2027 (Originally 09/29/2021) - pt declines, does not wish to get   MAMMOGRAM  08/25/2022   Medicare Annual Wellness (AWV)  04/19/2023   DEXA SCAN  01/11/2024   DTaP/Tdap/Td (4 - Td or Tdap) 02/28/2032   COLONOSCOPY (Pts 45-19yrs Insurance coverage will need to be confirmed)  04/15/2032   Pneumonia Vaccine 6+ Years old  Completed   INFLUENZA VACCINE  Completed   Hepatitis C Screening  Completed   Zoster Vaccines- Shingrix  Completed   HPV VACCINES  Aged Out    Education and counseling on the following was provided based on the above review of health and a plan/checklist for the patient, along with additional information discussed, was provided for the patient in the patient instructions :  -denies balance issues -Advised and counseled on a whole foods based healthy diet and regular exercise. A summary of a healthy diet was provided in the Patient Instructions. -congratulated on her wonderful walking routine! Discussed exercise guidelines and she  seems interested in adding in some strength training with the silver sneakers program. Exercise guidelines provided in patient instructions.  -Advise yearly dental visits at minimum and regular eye exams  Follow up: see patient instructions     Patient Instructions  I really enjoyed getting to talk with you today! I am available on Tuesdays and Thursdays for virtual visits if you have any questions or concerns, or if I can be of any further assistance.   CHECKLIST FROM ANNUAL WELLNESS VISIT:  -Follow up (please call to schedule if not scheduled after visit):   -yearly for annual wellness visit with primary care office  Here is a list of your preventive care/health maintenance measures and the plan for each if any are due:  Health Maintenance  Topic Date Due   COVID-19 Vaccine (4 - 2023-24 season) 05/05/2027 (Originally 09/29/2021) - declined per patient preference   MAMMOGRAM  08/25/2022   Medicare Annual Wellness (AWV)  04/19/2023   DEXA SCAN  01/11/2024   DTaP/Tdap/Td (4 - Td or Tdap) 02/28/2032   COLONOSCOPY (Pts 45-22yrs Insurance coverage will need to be confirmed)  04/15/2032   Pneumonia Vaccine 71+ Years old  Completed   INFLUENZA VACCINE  Completed   Hepatitis C Screening  Completed   Zoster Vaccines- Shingrix  Completed   HPV VACCINES  Aged Out    -See a dentist at least yearly  -Get your eyes checked and then per your eye specialist's recommendations  -  Other issues addressed today:  -I have included below further information regarding a healthy whole foods based diet, physical activity guidelines for adults, stress management and opportunities for social connections. I hope you find this information useful.    -----------------------------------------------------------------------------------------------------------------------------------------------------------------------------------------------------------------------------------------------------------  NUTRITION: -eat real food: lots of colorful vegetables (half the plate) and fruits -5-7 servings of vegetables and fruits per day (fresh or steamed is best), exp. 2 servings of vegetables with lunch and dinner and 2 servings of fruit per day. Berries and greens such as kale and collards are great choices.  -consume on a regular basis: whole grains (make sure first ingredient on label contains the word "whole"), fresh fruits, fish, nuts, seeds, healthy oils (such as olive oil, avocado oil, grape seed oil) -may eat small amounts of dairy and lean meat on occasion, but avoid processed meats such as ham, bacon, lunch meat, etc. -drink water -try to avoid fast food and pre-packaged foods, processed meat -most experts advise limiting sodium to < 2300mg  per day, should limit further is any chronic conditions such as high blood pressure, heart disease, diabetes, etc. The American Heart Association advised that < 1500mg  is is ideal -try to avoid foods that contain any ingredients with names you do not recognize  -try to avoid sugar/sweets (except for the natural sugar that occurs in fresh fruit) -try to avoid sweet drinks -try to avoid white rice, white bread, pasta (unless whole grain), white or yellow potatoes  EXERCISE GUIDELINES FOR ADULTS: -if you wish to increase your physical activity, do so gradually and with the approval of your doctor -STOP and seek medical care immediately if you have any chest pain, chest discomfort or trouble breathing when starting or increasing exercise  -move and stretch your body, legs, feet and arms when sitting for long periods -Physical activity guidelines for optimal health in adults: -least 150 minutes per week of  aerobic exercise (can talk, but not sing) once approved by your doctor, 20-30 minutes of sustained activity or two 10 minute episodes of sustained activity every day.  -resistance training at least 2 days per week if approved by your doctor -balance exercises 3+ days per week:   Stand somewhere where you have something sturdy to hold onto if you lose balance.    1) lift up on toes, start with 5x per day and work up to 20x   2) stand and lift on leg straight out to the side so that foot is a few inches of the floor, start with 5x each side and work up to 20x each side   3) stand on one foot, start with 5 seconds each side and work up to 20 seconds on each side  If you need ideas or help with getting more active:  -Silver sneakers https://tools.silversneakers.com  -Walk with a Doc: http://stephens-thompson.biz/  -try to include resistance (weight lifting/strength building) and balance exercises twice per week: or the following link for ideas: ChessContest.fr  UpdateClothing.com.cy  STRESS MANAGEMENT: -can try meditating, or just sitting quietly with deep breathing while intentionally relaxing all parts of your body for 5 minutes daily -if you need further help with stress, anxiety or depression please follow up with your primary doctor or contact the wonderful folks at [place]: [place]: -options in [place] if you wish to engage in more social and exercise related activities:  -Silver sneakers https://tools.silversneakers.com  -Walk with a Doc: http://stephens-thompson.biz/  -Check out the [place] 50+ section on the [place]  website (hiking clubs, book clubs, cards and games, chess, exercise classes, aquatic classes and much more) - see the website for  details: https://www.[place].gov/departments/parks-recreation/active-adults50  -YouTube has lots of exercise videos for different ages and abilities as well  -[place] (a variety of indoor and outdoor inperson activities for adults). [phone]. [address].  -Virtual Online Classes (a variety of topics): see seniorplanet.org or call [phone]  -consider volunteering at a school, hospice center, church, senior center or elsewhere           Lucretia Kern, DO

## 2022-05-31 DIAGNOSIS — D2271 Melanocytic nevi of right lower limb, including hip: Secondary | ICD-10-CM | POA: Diagnosis not present

## 2022-05-31 DIAGNOSIS — Z1283 Encounter for screening for malignant neoplasm of skin: Secondary | ICD-10-CM | POA: Diagnosis not present

## 2022-05-31 DIAGNOSIS — D485 Neoplasm of uncertain behavior of skin: Secondary | ICD-10-CM | POA: Diagnosis not present

## 2022-05-31 DIAGNOSIS — D225 Melanocytic nevi of trunk: Secondary | ICD-10-CM | POA: Diagnosis not present

## 2022-07-24 ENCOUNTER — Other Ambulatory Visit: Payer: Self-pay | Admitting: Internal Medicine

## 2022-07-24 DIAGNOSIS — Z1231 Encounter for screening mammogram for malignant neoplasm of breast: Secondary | ICD-10-CM

## 2022-08-27 ENCOUNTER — Ambulatory Visit: Admission: RE | Admit: 2022-08-27 | Payer: PPO | Source: Ambulatory Visit

## 2022-08-27 DIAGNOSIS — Z1231 Encounter for screening mammogram for malignant neoplasm of breast: Secondary | ICD-10-CM | POA: Diagnosis not present

## 2022-10-09 DIAGNOSIS — H524 Presbyopia: Secondary | ICD-10-CM | POA: Diagnosis not present

## 2022-10-09 DIAGNOSIS — H5212 Myopia, left eye: Secondary | ICD-10-CM | POA: Diagnosis not present

## 2022-10-09 DIAGNOSIS — H04123 Dry eye syndrome of bilateral lacrimal glands: Secondary | ICD-10-CM | POA: Diagnosis not present

## 2022-10-09 DIAGNOSIS — H2513 Age-related nuclear cataract, bilateral: Secondary | ICD-10-CM | POA: Diagnosis not present

## 2022-10-09 DIAGNOSIS — H353132 Nonexudative age-related macular degeneration, bilateral, intermediate dry stage: Secondary | ICD-10-CM | POA: Diagnosis not present

## 2022-12-03 ENCOUNTER — Other Ambulatory Visit: Payer: Self-pay | Admitting: Internal Medicine

## 2022-12-18 ENCOUNTER — Encounter: Payer: Self-pay | Admitting: Internal Medicine

## 2023-01-06 ENCOUNTER — Encounter: Payer: Self-pay | Admitting: Internal Medicine

## 2023-01-06 NOTE — Progress Notes (Unsigned)
Subjective:    Patient ID: Sherry Wells, female    DOB: [birth year], 72 y.o.   MRN: [id number]      HPI Sherry Wells is here for a Physical exam and her chronic medical problems.   No changes.  No concerns.    Medications and allergies reviewed with patient and updated if appropriate.  Current Outpatient Medications on File Prior to Visit  Medication Sig Dispense Refill   alendronate (FOSAMAX) 70 MG tablet TAKE 1 TABLET BY MOUTH EVERY 7 DAYS WITH A FULL GLASS OF WATER AND ON AN EMPTY STOMACH 12 tablet 3   Calcium Carbonate-Vitamin D 600-400 MG-UNIT tablet Take 1 tablet by mouth 2 (two) times daily.     Multiple Vitamins-Minerals (MULTIVITAMIN WITH MINERALS) tablet Take 1 tablet by mouth daily.     vitamin C (ASCORBIC ACID) 500 MG tablet Take 500 mg by mouth daily.     No current facility-administered medications on file prior to visit.    Review of Systems  Constitutional:  Negative for fever.  Eyes:  Negative for visual disturbance.  Respiratory:  Negative for cough, shortness of breath and wheezing.   Cardiovascular:  Negative for chest pain, palpitations and leg swelling.  Gastrointestinal:  Negative for abdominal pain, blood in stool, constipation and diarrhea.       No gerd  Genitourinary:  Negative for dysuria.  Musculoskeletal:  Positive for arthralgias (mild oa). Negative for back pain.  Skin:  Negative for rash.  Neurological:  Negative for dizziness, light-headedness, numbness and headaches.  Psychiatric/Behavioral:  Negative for dysphoric mood. The patient is not nervous/anxious.        Objective:   Vitals:   01/07/23 1108  BP: 136/76  Pulse: 70  Temp: 98 F (36.7 C)  SpO2: 95%   Filed Weights   01/07/23 1108  Weight: 144 lb (65.3 kg)   Body mass index is 23.96 kg/m.  BP Readings from Last 3 Encounters:  01/07/23 136/76  04/16/22 (!) 140/65  03/03/22 (!) 145/74    Wt Readings from Last 3 Encounters:  01/07/23 144 lb (65.3 kg)  04/16/22  140 lb (63.5 kg)  03/26/22 140 lb (63.5 kg)       Physical Exam Constitutional: She appears well-developed and well-nourished. No distress.  HENT:  Head: Normocephalic and atraumatic.  Right Ear: External ear normal. Normal ear canal and TM Left Ear: External ear normal.  Normal ear canal and TM Mouth/Throat: Oropharynx is clear and moist.  Eyes: Conjunctivae normal.  Neck: Neck supple. No tracheal deviation present. No thyromegaly present.  No carotid bruit  Cardiovascular: Normal rate, regular rhythm and normal heart sounds.   No murmur heard.  No edema. Pulmonary/Chest: Effort normal and breath sounds normal. No respiratory distress. She has no wheezes. She has no rales.  Breast: deferred   Abdominal: Soft. She exhibits no distension. There is no tenderness.  Lymphadenopathy: She has no cervical adenopathy.  Skin: Skin is warm and dry. She is not diaphoretic.  Psychiatric: She has a normal mood and affect. Her behavior is normal.     Lab Results  Component Value Date   WBC 5.5 01/05/2022   HGB 13.4 01/05/2022   HCT 39.5 01/05/2022   PLT 241.0 01/05/2022   GLUCOSE 96 01/05/2022   CHOL 202 (H) 01/05/2022   TRIG 53.0 01/05/2022   HDL 84.30 01/05/2022   LDLDIRECT 109.5 09/22/2008   LDLCALC 107 (H) 01/05/2022   ALT 19 01/05/2022   AST 23 01/05/2022  NA 139 01/05/2022   K 4.2 01/05/2022   CL 104 01/05/2022   CREATININE 0.74 01/05/2022   BUN 15 01/05/2022   CO2 28 01/05/2022   TSH 1.58 01/05/2022   HGBA1C 5.2 01/01/2020         Assessment & Plan:   Physical exam: Screening blood work  ordered Exercise  walks 6 days a week, for one hour Weight  normal. Substance abuse  none   Reviewed recommended immunizations.   Health Maintenance  Topic Date Due   COVID-19 Vaccine (4 - 2023-24 season) 01/23/2023 (Originally 09/30/2022)   Medicare Annual Wellness (AWV)  04/19/2023   MAMMOGRAM  08/27/2023   DEXA SCAN  01/11/2024   Colonoscopy  04/16/2027    DTaP/Tdap/Td (4 - Td or Tdap) 02/28/2032   Pneumonia Vaccine 23+ Years old  Completed   INFLUENZA VACCINE  Completed   Hepatitis C Screening  Completed   Zoster Vaccines- Shingrix  Completed   HPV VACCINES  Aged Out          See Problem List for Assessment and Plan of chronic medical problems.

## 2023-01-06 NOTE — Patient Instructions (Addendum)
Blood work was ordered.       Medications changes include :   stop the fosamax after you complete what you have at home.     Return in about 1 year (around 01/07/2024) for Physical Exam.    Health Maintenance, Female Adopting a healthy lifestyle and getting preventive care are important in promoting health and wellness. Ask your health care provider about: The right schedule for you to have regular tests and exams. Things you can do on your own to prevent diseases and keep yourself healthy. What should I know about diet, weight, and exercise? Eat a healthy diet  Eat a diet that includes plenty of vegetables, fruits, low-fat dairy products, and lean protein. Do not eat a lot of foods that are high in solid fats, added sugars, or sodium. Maintain a healthy weight Body mass index (BMI) is used to identify weight problems. It estimates body fat based on height and weight. Your health care provider can help determine your BMI and help you achieve or maintain a healthy weight. Get regular exercise Get regular exercise. This is one of the most important things you can do for your health. Most adults should: Exercise for at least 150 minutes each week. The exercise should increase your heart rate and make you sweat (moderate-intensity exercise). Do strengthening exercises at least twice a week. This is in addition to the moderate-intensity exercise. Spend less time sitting. Even light physical activity can be beneficial. Watch cholesterol and blood lipids Have your blood tested for lipids and cholesterol at 72 years of age, then have this test every 5 years. Have your cholesterol levels checked more often if: Your lipid or cholesterol levels are high. You are older than 72 years of age. You are at high risk for heart disease. What should I know about cancer screening? Depending on your health history and family history, you may need to have cancer screening at various ages. This  may include screening for: Breast cancer. Cervical cancer. Colorectal cancer. Skin cancer. Lung cancer. What should I know about heart disease, diabetes, and high blood pressure? Blood pressure and heart disease High blood pressure causes heart disease and increases the risk of stroke. This is more likely to develop in people who have high blood pressure readings or are overweight. Have your blood pressure checked: Every 3-5 years if you are 58-39 years of age. Every year if you are 46 years old or older. Diabetes Have regular diabetes screenings. This checks your fasting blood sugar level. Have the screening done: Once every three years after age 67 if you are at a normal weight and have a low risk for diabetes. More often and at a younger age if you are overweight or have a high risk for diabetes. What should I know about preventing infection? Hepatitis B If you have a higher risk for hepatitis B, you should be screened for this virus. Talk with your health care provider to find out if you are at risk for hepatitis B infection. Hepatitis C Testing is recommended for: Everyone born from [birth year] through 1965. Anyone with known risk factors for hepatitis C. Sexually transmitted infections (STIs) Get screened for STIs, including gonorrhea and chlamydia, if: You are sexually active and are younger than 72 years of age. You are older than 72 years of age and your health care provider tells you that you are at risk for this type of infection. Your sexual activity has changed since you were last  screened, and you are at increased risk for chlamydia or gonorrhea. Ask your health care provider if you are at risk. Ask your health care provider about whether you are at high risk for HIV. Your health care provider may recommend a prescription medicine to help prevent HIV infection. If you choose to take medicine to prevent HIV, you should first get tested for HIV. You should then be tested every 3  months for as long as you are taking the medicine. Pregnancy If you are about to stop having your period (premenopausal) and you may become pregnant, seek counseling before you get pregnant. Take 400 to 800 micrograms (mcg) of folic acid every day if you become pregnant. Ask for birth control (contraception) if you want to prevent pregnancy. Osteoporosis and menopause Osteoporosis is a disease in which the bones lose minerals and strength with aging. This can result in bone fractures. If you are 57 years old or older, or if you are at risk for osteoporosis and fractures, ask your health care provider if you should: Be screened for bone loss. Take a calcium or vitamin D supplement to lower your risk of fractures. Be given hormone replacement therapy (HRT) to treat symptoms of menopause. Follow these instructions at home: Alcohol use Do not drink alcohol if: Your health care provider tells you not to drink. You are pregnant, may be pregnant, or are planning to become pregnant. If you drink alcohol: Limit how much you have to: 0-1 drink a day. Know how much alcohol is in your drink. In the [place] one drink equals one 12 oz bottle of beer (355 mL), one 5 oz glass of wine (148 mL), or one 1 oz glass of hard liquor (44 mL). Lifestyle Do not use any products that contain nicotine or tobacco. These products include cigarettes, chewing tobacco, and vaping devices, such as e-cigarettes. If you need help quitting, ask your health care provider. Do not use street drugs. Do not share needles. Ask your health care provider for help if you need support or information about quitting drugs. General instructions Schedule regular health, dental, and eye exams. Stay current with your vaccines. Tell your health care provider if: You often feel depressed. You have ever been abused or do not feel safe at home. Summary Adopting a healthy lifestyle and getting preventive care are important in promoting health  and wellness. Follow your health care provider's instructions about healthy diet, exercising, and getting tested or screened for diseases. Follow your health care provider's instructions on monitoring your cholesterol and blood pressure. This information is not intended to replace advice given to you by your health care provider. Make sure you discuss any questions you have with your health care provider. Document Revised: 06/06/2020 Document Reviewed: 06/06/2020 Elsevier Patient Education  2024 [employer].

## 2023-01-07 ENCOUNTER — Ambulatory Visit (INDEPENDENT_AMBULATORY_CARE_PROVIDER_SITE_OTHER): Payer: PPO | Admitting: Internal Medicine

## 2023-01-07 VITALS — BP 136/76 | HR 70 | Temp 98.0°F | Ht 65.0 in | Wt 144.0 lb

## 2023-01-07 DIAGNOSIS — Z0000 Encounter for general adult medical examination without abnormal findings: Secondary | ICD-10-CM | POA: Diagnosis not present

## 2023-01-07 DIAGNOSIS — M810 Age-related osteoporosis without current pathological fracture: Secondary | ICD-10-CM | POA: Diagnosis not present

## 2023-01-07 DIAGNOSIS — N3281 Overactive bladder: Secondary | ICD-10-CM

## 2023-01-07 DIAGNOSIS — Z853 Personal history of malignant neoplasm of breast: Secondary | ICD-10-CM

## 2023-01-07 LAB — CBC WITH DIFFERENTIAL/PLATELET
Basophils Absolute: 0 10*3/uL (ref 0.0–0.1)
Basophils Relative: 0.6 % (ref 0.0–3.0)
Eosinophils Absolute: 0 10*3/uL (ref 0.0–0.7)
Eosinophils Relative: 0.8 % (ref 0.0–5.0)
HCT: 41.8 % (ref 36.0–46.0)
Hemoglobin: 13.8 g/dL (ref 12.0–15.0)
Lymphocytes Relative: 27.1 % (ref 12.0–46.0)
Lymphs Abs: 1.6 10*3/uL (ref 0.7–4.0)
MCHC: 33 g/dL (ref 30.0–36.0)
MCV: 101.2 fL — ABNORMAL HIGH (ref 78.0–100.0)
Monocytes Absolute: 0.4 10*3/uL (ref 0.1–1.0)
Monocytes Relative: 6.6 % (ref 3.0–12.0)
Neutro Abs: 3.8 10*3/uL (ref 1.4–7.7)
Neutrophils Relative %: 64.9 % (ref 43.0–77.0)
Platelets: 225 10*3/uL (ref 150.0–400.0)
RBC: 4.13 Mil/uL (ref 3.87–5.11)
RDW: 14 % (ref 11.5–15.5)
WBC: 5.8 10*3/uL (ref 4.0–10.5)

## 2023-01-07 LAB — LIPID PANEL
Cholesterol: 206 mg/dL — ABNORMAL HIGH (ref 0–200)
HDL: 75.8 mg/dL (ref >39.00)
LDL Cholesterol: 117 mg/dL — ABNORMAL HIGH (ref 0–99)
NonHDL: 129.79
Total CHOL/HDL Ratio: 3
Triglycerides: 65 mg/dL (ref 0.0–149.0)
VLDL: 13 mg/dL (ref 0.0–40.0)

## 2023-01-07 LAB — COMPREHENSIVE METABOLIC PANEL
ALT: 17 U/L (ref 0–35)
AST: 23 U/L (ref 0–37)
Albumin: 4.6 g/dL (ref 3.5–5.2)
Alkaline Phosphatase: 64 U/L (ref 39–117)
BUN: 15 mg/dL (ref 6–23)
CO2: 29 meq/L (ref 19–32)
Calcium: 9.4 mg/dL (ref 8.4–10.5)
Chloride: 102 meq/L (ref 96–112)
Creatinine, Ser: 0.7 mg/dL (ref 0.40–1.20)
GFR: 86.19 mL/min (ref >60.00)
Glucose, Bld: 95 mg/dL (ref 70–99)
Potassium: 3.9 meq/L (ref 3.5–5.1)
Sodium: 141 meq/L (ref 135–145)
Total Bilirubin: 0.7 mg/dL (ref 0.2–1.2)
Total Protein: 7.2 g/dL (ref 6.0–8.3)

## 2023-01-07 LAB — TSH: TSH: 1.8 u[IU]/mL (ref 0.35–5.50)

## 2023-01-07 LAB — VITAMIN D 25 HYDROXY (VIT D DEFICIENCY, FRACTURES): VITD: 64.13 ng/mL (ref 30.00–100.00)

## 2023-01-07 NOTE — Assessment & Plan Note (Signed)
Chronic Continue fosamax weekly - started 01/2018 - plan to stop 01/2023 -- will complete her current medication - has 2 months left Dexa due 12/2023 Continue calcium and vitamin d Continue regular walking Check vitamin d level, cbc, cmp, tsh

## 2023-01-07 NOTE — Assessment & Plan Note (Signed)
H/o breast ca Mammo up to date No evidence of recurrence

## 2023-03-27 ENCOUNTER — Ambulatory Visit: Payer: PPO

## 2023-03-27 VITALS — Ht 65.0 in | Wt 144.0 lb

## 2023-03-27 DIAGNOSIS — M8000XD Age-related osteoporosis with current pathological fracture, unspecified site, subsequent encounter for fracture with routine healing: Secondary | ICD-10-CM

## 2023-03-27 DIAGNOSIS — Z0000 Encounter for general adult medical examination without abnormal findings: Secondary | ICD-10-CM | POA: Diagnosis not present

## 2023-03-27 DIAGNOSIS — Z1231 Encounter for screening mammogram for malignant neoplasm of breast: Secondary | ICD-10-CM | POA: Diagnosis not present

## 2023-03-27 NOTE — Patient Instructions (Addendum)
 Ms. Schreier , Thank you for taking time to come for your Medicare Wellness Visit. I appreciate your ongoing commitment to your health goals. Please review the following plan we discussed and let me know if I can assist you in the future.   Referrals/Orders/Follow-Ups/Clinician Recommendations: Aim for 30 minutes of exercise or brisk walking, 6-8 glasses of water, and 5 servings of fruits and vegetables each day. Ordered repeat Mammogram and DEXA scan for 2025.  Educated and advised on COVID vaccine due in 2025.   This is a list of the screening recommended for you and due dates:  Health Maintenance  Topic Date Due   COVID-19 Vaccine (4 - 2024-25 season) 09/30/2022   Mammogram  08/27/2023   DEXA scan (bone density measurement)  01/11/2024   Medicare Annual Wellness Visit  03/26/2024   Colon Cancer Screening  04/16/2027   DTaP/Tdap/Td vaccine (4 - Td or Tdap) 02/28/2032   Pneumonia Vaccine  Completed   Flu Shot  Completed   Hepatitis C Screening  Completed   Zoster (Shingles) Vaccine  Completed   HPV Vaccine  Aged Out    Advanced directives: (In Chart) A copy of your advanced directives are scanned into your chart should your provider ever need it.  Next Medicare Annual Wellness Visit scheduled for next year: Yes - 12/2024

## 2023-03-27 NOTE — Progress Notes (Signed)
 Subjective:   Sherry Wells is a 73 y.o. who presents for a Medicare Wellness preventive visit.  Visit Complete: Virtual I connected with  [name] on 03/27/23 by a audio enabled telemedicine application and verified that I am speaking with the correct person using two identifiers.  Patient Location: Home   Provider Location: Office/Clinic  I discussed the limitations of evaluation and management by telemedicine. The patient expressed understanding and agreed to proceed.  Vital Signs: Because this visit was a virtual/telehealth visit, some criteria may be missing or patient reported. Any vitals not documented were not able to be obtained and vitals that have been documented are patient reported.  VideoDeclined- This patient declined Librarian, academic. Therefore the visit was completed with audio only.  AWV Questionnaire: Yes: Patient Medicare AWV questionnaire was completed by the patient on 03/23/2023; I have confirmed that all information answered by patient is correct and no changes since this date.  Cardiac Risk Factors include: advanced age (>50men, >43 women)     Objective:    Today's Vitals   03/27/23 1101  Weight: 144 lb (65.3 kg)  Height: 5\' 5"  (1.651 m)   Body mass index is 23.96 kg/m.     03/27/2023   11:00 AM 04/11/2021   11:29 AM 04/01/2020   11:27 AM 12/17/2017    2:21 PM 07/28/2014    8:19 AM 11/20/2013    3:39 PM 07/29/2013    8:40 AM  Advanced Directives  Does Patient Have a Medical Advance Directive? Yes Yes No No No No Patient does not have advance directive;Patient would not like information  Type of Public librarian Power of [place];Living will Healthcare Power of [place];Living will       Does patient want to make changes to medical advance directive? No - Patient declined        Copy of Healthcare Power of Attorney in Chart? Yes - validated most recent copy scanned in chart (See row  information) Yes - validated most recent copy scanned in chart (See row information)       Would patient like information on creating a medical advance directive?   Yes (MAU/Ambulatory/Procedural Areas - Information given) No - Patient declined No - patient declined information No - patient declined information     Current Medications (verified) Outpatient Encounter Medications as of 03/27/2023  Medication Sig   Calcium Carbonate-Vitamin D 600-400 MG-UNIT tablet Take 1 tablet by mouth 2 (two) times daily.   FLUAD 0.5 ML injection Inject 0.5 mLs into the muscle once.   Multiple Vitamins-Minerals (MULTIVITAMIN WITH MINERALS) tablet Take 1 tablet by mouth daily.   vitamin C (ASCORBIC ACID) 500 MG tablet Take 500 mg by mouth daily.   [DISCONTINUED] alendronate (FOSAMAX) 70 MG tablet TAKE 1 TABLET BY MOUTH EVERY 7 DAYS WITH A FULL GLASS OF WATER AND ON AN EMPTY STOMACH   No facility-administered encounter medications on file as of 03/27/2023.    Allergies (verified) Patient has no known allergies.   History: Past Medical History:  Diagnosis Date   Breast cancer (HCC) 06/2013   LEFT   Fibrocystic breast disease    Hyperlipidemia    Osteoporosis    on meds   Personal history of radiation therapy    PONV (postoperative nausea and vomiting)    S/P radiation therapy 09/08/2013-10/07/2013    1) Left breast / 40.05Gy in 15 fractions/ 2) Left Breast Boost / 10 Gy in 5 fractions   Past Surgical History:  Procedure Laterality Date   BREAST BIOPSY Left 2015   BREAST CYST ASPIRATION Left 1980   BREAST EXCISIONAL BIOPSY Left 1985   BREAST LUMPECTOMY  1980   cyst   BREAST LUMPECTOMY Left 07/13/2013   COLONOSCOPY  2002 & 2013   negative; [facility]   DILATION AND CURETTAGE OF UTERUS     x2   TUBAL LIGATION  1994   Family History  Problem Relation Age of Onset   Diabetes Mother    Transient ischemic attack Mother        in 39s   Hypertension Mother    Valvular heart disease Father         Aortic valve   Osteoporosis Sister    Cardiomyopathy Sister    Throat cancer Maternal Uncle    Cancer Maternal Uncle        ? primary   Valvular heart disease Paternal Uncle        Aortic valve   Colon cancer Neg Hx    Esophageal cancer Neg Hx    Ulcerative colitis Neg Hx    Stomach cancer Neg Hx    Rectal cancer Neg Hx    Social History   Socioeconomic History   Marital status: Married    Spouse name: Not on file   Number of children: Not on file   Years of education: Not on file   Highest education level: 12th grade  Occupational History   Occupation: retired  Tobacco Use   Smoking status: Never   Smokeless tobacco: Never  Vaping Use   Vaping status: Never Used  Substance and Sexual Activity   Alcohol use: No   Drug use: No   Sexual activity: Yes    Birth control/protection: Post-menopausal    Comment: meanarche 13, G0, menopause age 25, no HRT  Other Topics Concern   Not on file  Social History Narrative   Not on file   Social Drivers of Health   Financial Resource Strain: Low Risk  (03/23/2023)   Overall Financial Resource Strain (CARDIA)    Difficulty of Paying Living Expenses: Not hard at all  Food Insecurity: No Food Insecurity (03/27/2023)   Hunger Vital Sign    Worried About Running Out of Food in the Last Year: Never true    Ran Out of Food in the Last Year: Never true  Transportation Needs: No Transportation Needs (03/27/2023)   PRAPARE - Administrator, Civil Service (Medical): No    Lack of Transportation (Non-Medical): No  Physical Activity: Sufficiently Active (03/27/2023)   Exercise Vital Sign    Days of Exercise per Week: 6 days    Minutes of Exercise per Session: 60 min  Stress: No Stress Concern Present (03/27/2023)   [employer] of Occupational Health - Occupational Stress Questionnaire    Feeling of Stress : Not at all  Social Connections: Socially Integrated (03/27/2023)   Social Connection and Isolation Panel [NHANES]     Frequency of Communication with Friends and Family: More than three times a week    Frequency of Social Gatherings with Friends and Family: More than three times a week    Attends Religious Services: More than 4 times per year    Active Member of [employer] or Organizations: Yes    Attends Banker Meetings: More than 4 times per year    Marital Status: Married    Tobacco Counseling - Non Smoker Counseling given: N/A   Clinical Intake: Completed 03/27/2023  Pain : No/denies pain     BMI - recorded: 23.96 Nutritional Status: BMI of 19-24  Normal Nutritional Risks: None Diabetes: No  How often do you need to have someone help you when you read instructions, pamphlets, or other written materials from your doctor or pharmacy?: 1 - Never  Interpreter Needed?: No  Information entered by :: Hassell Halim, CMA   Activities of Daily Living: Completed 03/27/2023    03/27/2023   11:06 AM 03/23/2023    2:22 PM  In your present state of health, do you have any difficulty performing the following activities:  Hearing? 0 0  Vision? 0 0  Difficulty concentrating or making decisions? 0 0  Walking or climbing stairs? 0 0  Dressing or bathing? 0 0  Doing errands, shopping? 0 0  Preparing Food and eating ? N N  Using the Toilet? N N  In the past six months, have you accidently leaked urine? N N  Do you have problems with loss of bowel control? N N  Managing your Medications? N N  Managing your Finances? N N  Housekeeping or managing your Housekeeping? N N    Patient Care Team: Pincus Sanes, MD as PCP - General (Internal Medicine) Janet Berlin, MD as Consulting Physician (Ophthalmology)  Indicate any recent Medical Services you may have received from other than [facility] providers in the past year (date may be approximate).     Assessment:   This is a routine wellness examination for Sherry Wells.  Hearing/Vision screen Hearing Screening - Comments:: Denies hearing  difficulties   Vision Screening - Comments:: Wears eyeglasses for reading only - up to date with routine eye exams with Dr Burgess Estelle   Goals Addressed               This Visit's Progress     Patient Stated (pt-stated)        Patient stated she wants to continue exercising 6 days a week and eat healthy (vegetables) daily.       Depression Screen Completed 03/27/2023    01/07/2023   11:14 AM 04/19/2022    9:47 AM 01/05/2022   10:19 AM 04/11/2021   11:46 AM 04/01/2020   11:26 AM 01/01/2020    8:43 AM 02/17/2018    9:35 AM  PHQ 2/9 Scores  PHQ - 2 Score 0 0 0 0 0 0 0  PHQ- 9 Score 0 0 0        Fall Risk Completed 03/27/2023    03/27/2023   11:06 AM 03/23/2023    2:22 PM 01/07/2023   11:14 AM 04/19/2022    9:47 AM 01/05/2022   10:19 AM  Fall Risk   Falls in the past year? 0 0 0 0 0  Number falls in past yr: 0 0 0 0 0  Injury with Fall? 0 0 0 0 0  Risk for fall due to : No Fall Risks  No Fall Risks  No Fall Risks  Follow up Falls evaluation completed;Falls prevention discussed  Falls evaluation completed  Falls evaluation completed    MEDICARE RISK AT HOME: Completed 03/27/2023 Medicare Risk at Home Any stairs in or around the home?: No If so, are there any without handrails?: No Home free of loose throw rugs in walkways, pet beds, electrical cords, etc?: Yes Adequate lighting in your home to reduce risk of falls?: Yes Life alert?: No Use of a cane, walker or w/c?: No Grab bars in the bathroom?: Yes Shower chair or bench  in shower?: Yes Elevated toilet seat or a handicapped toilet?: Yes  TIMED UP AND GO:  Was the test performed?  No  Cognitive Function: 6CIT completed        03/27/2023   11:07 AM  6CIT Screen  What Year? 0 points  What month? 0 points  What time? 0 points  Count back from 20 0 points  Months in reverse 0 points  Repeat phrase 0 points  Total Score 0 points    Immunizations Immunization History  Administered Date(s) Administered   Fluad  Quad(high Dose 65+) 12/24/2018, 01/01/2020   Fluad Trivalent(High Dose 65+) 12/17/2022   Influenza Whole 10/29/2009, 11/01/2011   Influenza, High Dose Seasonal PF 12/06/2016, 12/17/2017   Influenza,inj,Quad PF,6+ Mos 11/19/2012   Influenza-Unspecified 10/28/2013, 11/23/2014, 11/23/2015, 11/14/2020, 11/23/2021   PFIZER(Purple Top)SARS-COV-2 Vaccination 03/13/2019, 04/03/2019, 11/19/2019   Pneumococcal Conjugate-13 12/06/2016   Pneumococcal Polysaccharide-23 12/17/2017   Td 03/09/2003, 02/27/2022   Tdap 10/17/2011   Zoster Recombinant(Shingrix) 03/14/2018, 07/23/2018   Zoster, Live 01/09/2016    Screening Tests Health Maintenance  Topic Date Due   COVID-19 Vaccine (4 - 2024-25 season) 09/30/2022   MAMMOGRAM  08/27/2023   DEXA SCAN  01/11/2024   Medicare Annual Wellness (AWV)  03/26/2024   Colonoscopy  04/16/2027   DTaP/Tdap/Td (4 - Td or Tdap) 02/28/2032   Pneumonia Vaccine 56+ Years old  Completed   INFLUENZA VACCINE  Completed   Hepatitis C Screening  Completed   Zoster Vaccines- Shingrix  Completed   HPV VACCINES  Aged Out    Health Maintenance  Health Maintenance Due  Topic Date Due   COVID-19 Vaccine (4 - 2024-25 season) 09/30/2022   Health Maintenance Items Addressed: 03/27/2023. Pt declined the COVID vaccine.  Mammogram status: Completed 08/27/2022. Order mammogram today.  DEXA scan status: Completed 01/10/2022. Repeat every 2 years. DEXA ordered today.  Additional Screening:  Vision Screening: Recommended annual ophthalmology exams for early detection of glaucoma and other disorders of the eye. Pt sees Dr Burgess Estelle for annual eye exam.  Currently uses eyeglasses for reading only.  Dental Screening: Recommended annual dental exams for proper oral hygiene.  Community Resource Referral / Chronic Care Management: CRR required this visit?  No   CCM required this visit?  No     Plan:     I have personally reviewed and noted the following in the patient's chart:    Medical and social history Use of alcohol, tobacco or illicit drugs  Current medications and supplements including opioid prescriptions. Patient is not currently taking opioid prescriptions. Functional ability and status Nutritional status Physical activity Advanced directives List of other physicians Hospitalizations, surgeries, and ER visits in previous 12 months Vitals Screenings to include cognitive, depression, and falls Referrals and appointments  In addition, I have reviewed and discussed with patient certain preventive protocols, quality metrics, and best practice recommendations. A written personalized care plan for preventive services as well as general preventive health recommendations were provided to patient.     Darreld Mclean, CMA   03/27/2023   After Visit Summary: (MyChart) Due to this being a telephonic visit, the after visit summary with patients personalized plan was offered to patient via MyChart   Notes: Nothing significant to report at this time.

## 2023-04-02 NOTE — Progress Notes (Signed)
 Subjective:   Sherry Wells is a 73 y.o. who presents for a Medicare Wellness preventive visit.  Visit Complete: Virtual I connected with  [name] on 03/27/23 by a audio enabled telemedicine application and verified that I am speaking with the correct person using two identifiers.  Patient Location: Home   Provider Location: Office/Clinic  I discussed the limitations of evaluation and management by telemedicine. The patient expressed understanding and agreed to proceed.  Vital Signs: Because this visit was a virtual/telehealth visit, some criteria may be missing or patient reported. Any vitals not documented were not able to be obtained and vitals that have been documented are patient reported.  VideoDeclined- This patient declined Librarian, academic. Therefore the visit was completed with audio only.  AWV Questionnaire: Yes: Patient Medicare AWV questionnaire was completed by the patient on 03/23/2023; I have confirmed that all information answered by patient is correct and no changes since this date.  Cardiac Risk Factors include: advanced age (>69men, >85 women)     Objective:    Today's Vitals   03/27/23 1101  Weight: 144 lb (65.3 kg)  Height: 5\' 5"  (1.651 m)   Body mass index is 23.96 kg/m.     03/27/2023   11:00 AM 04/11/2021   11:29 AM 04/01/2020   11:27 AM 12/17/2017    2:21 PM 07/28/2014    8:19 AM 11/20/2013    3:39 PM 07/29/2013    8:40 AM  Advanced Directives  Does Patient Have a Medical Advance Directive? Yes Yes No No No No Patient does not have advance directive;Patient would not like information  Type of Public librarian Power of [place];Living will Healthcare Power of [place];Living will       Does patient want to make changes to medical advance directive? No - Patient declined        Copy of Healthcare Power of Attorney in Chart? Yes - validated most recent copy scanned in chart (See row information)  Yes - validated most recent copy scanned in chart (See row information)       Would patient like information on creating a medical advance directive?   Yes (MAU/Ambulatory/Procedural Areas - Information given) No - Patient declined No - patient declined information No - patient declined information     Current Medications (verified) Outpatient Encounter Medications as of 03/27/2023  Medication Sig   Calcium Carbonate-Vitamin D 600-400 MG-UNIT tablet Take 1 tablet by mouth 2 (two) times daily.   FLUAD 0.5 ML injection Inject 0.5 mLs into the muscle once.   Multiple Vitamins-Minerals (MULTIVITAMIN WITH MINERALS) tablet Take 1 tablet by mouth daily.   vitamin C (ASCORBIC ACID) 500 MG tablet Take 500 mg by mouth daily.   [DISCONTINUED] alendronate (FOSAMAX) 70 MG tablet TAKE 1 TABLET BY MOUTH EVERY 7 DAYS WITH A FULL GLASS OF WATER AND ON AN EMPTY STOMACH   No facility-administered encounter medications on file as of 03/27/2023.    Allergies (verified) Patient has no known allergies.   History: Past Medical History:  Diagnosis Date   Breast cancer (HCC) 06/2013   LEFT   Fibrocystic breast disease    Hyperlipidemia    Osteoporosis    on meds   Personal history of radiation therapy    PONV (postoperative nausea and vomiting)    S/P radiation therapy 09/08/2013-10/07/2013    1) Left breast / 40.05Gy in 15 fractions/ 2) Left Breast Boost / 10 Gy in 5 fractions   Past Surgical History:  Procedure Laterality Date   BREAST BIOPSY Left 2015   BREAST CYST ASPIRATION Left 1980   BREAST EXCISIONAL BIOPSY Left 1985   BREAST LUMPECTOMY  1980   cyst   BREAST LUMPECTOMY Left 07/13/2013   COLONOSCOPY  2002 & 2013   negative; [facility]   DILATION AND CURETTAGE OF UTERUS     x2   TUBAL LIGATION  1994   Family History  Problem Relation Age of Onset   Diabetes Mother    Transient ischemic attack Mother        in 69s   Hypertension Mother    Valvular heart disease Father        Aortic valve    Osteoporosis Sister    Cardiomyopathy Sister    Throat cancer Maternal Uncle    Cancer Maternal Uncle        ? primary   Valvular heart disease Paternal Uncle        Aortic valve   Colon cancer Neg Hx    Esophageal cancer Neg Hx    Ulcerative colitis Neg Hx    Stomach cancer Neg Hx    Rectal cancer Neg Hx    Social History   Socioeconomic History   Marital status: Married    Spouse name: Not on file   Number of children: Not on file   Years of education: Not on file   Highest education level: 12th grade  Occupational History   Occupation: retired  Tobacco Use   Smoking status: Never   Smokeless tobacco: Never  Vaping Use   Vaping status: Never Used  Substance and Sexual Activity   Alcohol use: No   Drug use: No   Sexual activity: Yes    Birth control/protection: Post-menopausal    Comment: meanarche 13, G0, menopause age 34, no HRT  Other Topics Concern   Not on file  Social History Narrative   Not on file   Social Drivers of Health   Financial Resource Strain: Low Risk  (03/23/2023)   Overall Financial Resource Strain (CARDIA)    Difficulty of Paying Living Expenses: Not hard at all  Food Insecurity: No Food Insecurity (03/27/2023)   Hunger Vital Sign    Worried About Running Out of Food in the Last Year: Never true    Ran Out of Food in the Last Year: Never true  Transportation Needs: No Transportation Needs (03/27/2023)   PRAPARE - Administrator, Civil Service (Medical): No    Lack of Transportation (Non-Medical): No  Physical Activity: Sufficiently Active (03/27/2023)   Exercise Vital Sign    Days of Exercise per Week: 6 days    Minutes of Exercise per Session: 60 min  Stress: No Stress Concern Present (03/27/2023)   [employer] of Occupational Health - Occupational Stress Questionnaire    Feeling of Stress : Not at all  Social Connections: Socially Integrated (03/27/2023)   Social Connection and Isolation Panel [NHANES]    Frequency of  Communication with Friends and Family: More than three times a week    Frequency of Social Gatherings with Friends and Family: More than three times a week    Attends Religious Services: More than 4 times per year    Active Member of [employer] or Organizations: Yes    Attends Banker Meetings: More than 4 times per year    Marital Status: Married    Tobacco Counseling - Non Smoker Counseling given: N/A   Clinical Intake: Completed 03/27/2023  Pain : No/denies pain     BMI - recorded: 23.96 Nutritional Status: BMI of 19-24  Normal Nutritional Risks: None Diabetes: No  How often do you need to have someone help you when you read instructions, pamphlets, or other written materials from your doctor or pharmacy?: 1 - Never  Interpreter Needed?: No  Information entered by :: Hassell Halim, CMA   Activities of Daily Living: Completed 03/27/2023    03/27/2023   11:06 AM 03/23/2023    2:22 PM  In your present state of health, do you have any difficulty performing the following activities:  Hearing? 0 0  Vision? 0 0  Difficulty concentrating or making decisions? 0 0  Walking or climbing stairs? 0 0  Dressing or bathing? 0 0  Doing errands, shopping? 0 0  Preparing Food and eating ? N N  Using the Toilet? N N  In the past six months, have you accidently leaked urine? N N  Do you have problems with loss of bowel control? N N  Managing your Medications? N N  Managing your Finances? N N  Housekeeping or managing your Housekeeping? N N    Patient Care Team: Pincus Sanes, MD as PCP - General (Internal Medicine) Janet Berlin, MD as Consulting Physician (Ophthalmology)  Indicate any recent Medical Services you may have received from other than [facility] providers in the past year (date may be approximate).     Assessment:   This is a routine wellness examination for Ziare.  Hearing/Vision screen Hearing Screening - Comments:: Denies hearing difficulties   Vision  Screening - Comments:: Wears eyeglasses for reading only - up to date with routine eye exams with Dr Burgess Estelle   Goals Addressed               This Visit's Progress     Patient Stated (pt-stated)        Patient stated she wants to continue exercising 6 days a week and eat healthy (vegetables) daily.       Depression Screen Completed 03/27/2023    03/29/2023    7:54 AM 01/07/2023   11:14 AM 04/19/2022    9:47 AM 01/05/2022   10:19 AM 04/11/2021   11:46 AM 04/01/2020   11:26 AM 01/01/2020    8:43 AM  PHQ 2/9 Scores  PHQ - 2 Score 0 0 0 0 0 0 0  PHQ- 9 Score 0 0 0 0       Fall Risk Completed 03/27/2023    03/27/2023   11:06 AM 03/23/2023    2:22 PM 01/07/2023   11:14 AM 04/19/2022    9:47 AM 01/05/2022   10:19 AM  Fall Risk   Falls in the past year? 0 0 0 0 0  Number falls in past yr: 0 0 0 0 0  Injury with Fall? 0 0 0 0 0  Risk for fall due to : No Fall Risks  No Fall Risks  No Fall Risks  Follow up Falls evaluation completed;Falls prevention discussed  Falls evaluation completed  Falls evaluation completed    MEDICARE RISK AT HOME: Completed 03/27/2023 Medicare Risk at Home Any stairs in or around the home?: No If so, are there any without handrails?: No Home free of loose throw rugs in walkways, pet beds, electrical cords, etc?: Yes Adequate lighting in your home to reduce risk of falls?: Yes Life alert?: No Use of a cane, walker or w/c?: No Grab bars in the bathroom?: Yes Shower chair or bench  in shower?: Yes Elevated toilet seat or a handicapped toilet?: Yes  TIMED UP AND GO:  Was the test performed?  No  Cognitive Function: 6CIT completed        03/27/2023   11:07 AM  6CIT Screen  What Year? 0 points  What month? 0 points  What time? 0 points  Count back from 20 0 points  Months in reverse 0 points  Repeat phrase 0 points  Total Score 0 points    Immunizations Immunization History  Administered Date(s) Administered   Fluad Quad(high Dose 65+)  12/24/2018, 01/01/2020   Fluad Trivalent(High Dose 65+) 12/17/2022   Influenza Whole 10/29/2009, 11/01/2011   Influenza, High Dose Seasonal PF 12/06/2016, 12/17/2017   Influenza,inj,Quad PF,6+ Mos 11/19/2012   Influenza-Unspecified 10/28/2013, 11/23/2014, 11/23/2015, 11/14/2020, 11/23/2021   PFIZER(Purple Top)SARS-COV-2 Vaccination 03/13/2019, 04/03/2019, 11/19/2019   Pneumococcal Conjugate-13 12/06/2016   Pneumococcal Polysaccharide-23 12/17/2017   Td 03/09/2003, 02/27/2022   Tdap 10/17/2011   Zoster Recombinant(Shingrix) 03/14/2018, 07/23/2018   Zoster, Live 01/09/2016    Screening Tests Health Maintenance  Topic Date Due   COVID-19 Vaccine (4 - 2024-25 season) 09/30/2022   MAMMOGRAM  08/27/2023   DEXA SCAN  01/11/2024   Medicare Annual Wellness (AWV)  03/26/2024   Colonoscopy  04/16/2027   DTaP/Tdap/Td (4 - Td or Tdap) 02/28/2032   Pneumonia Vaccine 53+ Years old  Completed   INFLUENZA VACCINE  Completed   Hepatitis C Screening  Completed   Zoster Vaccines- Shingrix  Completed   HPV VACCINES  Aged Out    Health Maintenance  Health Maintenance Due  Topic Date Due   COVID-19 Vaccine (4 - 2024-25 season) 09/30/2022   Health Maintenance Items Addressed: 03/27/2023. Pt declined the COVID vaccine.  Mammogram status: Completed 08/27/2022. Order mammogram today.  DEXA scan status: Completed 01/10/2022. Repeat every 2 years. DEXA ordered today.  Additional Screening:  Vision Screening: Recommended annual ophthalmology exams for early detection of glaucoma and other disorders of the eye. Pt sees Dr Burgess Estelle for annual eye exam.  Currently uses eyeglasses for reading only.  Dental Screening: Recommended annual dental exams for proper oral hygiene.  Community Resource Referral / Chronic Care Management: CRR required this visit?  No   CCM required this visit?  No     Plan:     I have personally reviewed and noted the following in the patient's chart:   Medical and  social history Use of alcohol, tobacco or illicit drugs  Current medications and supplements including opioid prescriptions. Patient is not currently taking opioid prescriptions. Functional ability and status Nutritional status Physical activity Advanced directives List of other physicians Hospitalizations, surgeries, and ER visits in previous 12 months Vitals Screenings to include cognitive, depression, and falls Referrals and appointments  In addition, I have reviewed and discussed with patient certain preventive protocols, quality metrics, and best practice recommendations. A written personalized care plan for preventive services as well as general preventive health recommendations were provided to patient.     Darreld Mclean, CMA   04/02/2023   After Visit Summary: (MyChart) Due to this being a telephonic visit, the after visit summary with patients personalized plan was offered to patient via MyChart   Notes: Nothing significant to report at this time.

## 2023-04-05 ENCOUNTER — Telehealth: Payer: Self-pay | Admitting: Internal Medicine

## 2023-04-05 NOTE — Telephone Encounter (Signed)
 Copied from CRM [id number]. Topic: General - Other >> Apr 05, 2023 10:09 AM Melissa C wrote: Reason for CRM: patient had AWV over the phone back on 03/27/23. They set up several visits for her including a bone density scan and patient stated that they bone density scan is at the [facility] and she has always had that done at [facility] and was wondering if she could get that changed. She was also curious as to why her next AWV is in December of 2026 as that is extremely far off. Patient also stated that she thought representative set her up with an appointment with Cheryll Cockayne for the future but I do not see one on the schedule. Please advise with patient. Thank you.

## 2023-04-07 NOTE — Addendum Note (Signed)
 Addended by: Pincus Sanes on: 04/07/2023 11:23 AM   Modules accepted: Orders

## 2023-04-07 NOTE — Telephone Encounter (Signed)
 She last saw me 01/07/2023-maybe appointment that is for wellness was supposed to be for me-she should have a physical once a year.  Change current wellness visit scheduled for December to physical with me if patient is okay with that.  Looks like previous DEXA scans were done at the [facility] building-ideally she should have her bone density done there, but if she would like to have it done someplace else we can order it there.  Let me know where she wants her bone density and we can change it.

## 2023-04-30 ENCOUNTER — Encounter: Payer: Self-pay | Admitting: Internal Medicine

## 2023-06-25 DIAGNOSIS — Z01419 Encounter for gynecological examination (general) (routine) without abnormal findings: Secondary | ICD-10-CM | POA: Diagnosis not present

## 2023-07-15 DIAGNOSIS — Z1283 Encounter for screening for malignant neoplasm of skin: Secondary | ICD-10-CM | POA: Diagnosis not present

## 2023-07-15 DIAGNOSIS — D485 Neoplasm of uncertain behavior of skin: Secondary | ICD-10-CM | POA: Diagnosis not present

## 2023-07-15 DIAGNOSIS — D225 Melanocytic nevi of trunk: Secondary | ICD-10-CM | POA: Diagnosis not present

## 2023-07-15 DIAGNOSIS — L859 Epidermal thickening, unspecified: Secondary | ICD-10-CM | POA: Diagnosis not present

## 2023-08-28 ENCOUNTER — Ambulatory Visit
Admission: RE | Admit: 2023-08-28 | Discharge: 2023-08-28 | Disposition: A | Discharge status: Home / Self Care | Source: Ambulatory Visit | Attending: Internal Medicine | Admitting: Internal Medicine

## 2023-08-28 DIAGNOSIS — Z1231 Encounter for screening mammogram for malignant neoplasm of breast: Secondary | ICD-10-CM

## 2023-08-28 DIAGNOSIS — R92333 Mammographic heterogeneous density, bilateral breasts: Secondary | ICD-10-CM | POA: Diagnosis not present

## 2023-10-10 DIAGNOSIS — H04123 Dry eye syndrome of bilateral lacrimal glands: Secondary | ICD-10-CM | POA: Diagnosis not present

## 2023-10-10 DIAGNOSIS — H353132 Nonexudative age-related macular degeneration, bilateral, intermediate dry stage: Secondary | ICD-10-CM | POA: Diagnosis not present

## 2023-10-10 DIAGNOSIS — H52202 Unspecified astigmatism, left eye: Secondary | ICD-10-CM | POA: Diagnosis not present

## 2023-10-10 DIAGNOSIS — H2513 Age-related nuclear cataract, bilateral: Secondary | ICD-10-CM | POA: Diagnosis not present

## 2023-10-10 DIAGNOSIS — H524 Presbyopia: Secondary | ICD-10-CM | POA: Diagnosis not present

## 2023-11-01 ENCOUNTER — Encounter: Payer: Self-pay | Admitting: Internal Medicine

## 2024-01-12 ENCOUNTER — Encounter: Payer: Self-pay | Admitting: Internal Medicine

## 2024-01-12 NOTE — Progress Notes (Unsigned)
 Subjective:    Patient ID: Sherry Wells, female    DOB: 11-12-1950, 73 y.o.   MRN: [id number]      HPI Sherry Wells is here for a Physical exam and her chronic medical problems.    Overall doing well.  Has no concerns.   Medications and allergies reviewed with patient and updated if appropriate.  Medications Ordered Prior to Encounter[1]  Review of Systems  Constitutional:  Negative for fever.  Eyes:  Negative for visual disturbance.  Respiratory:  Negative for cough, shortness of breath and wheezing.   Cardiovascular:  Negative for chest pain, palpitations and leg swelling.  Gastrointestinal:  Negative for abdominal pain, blood in stool, constipation and diarrhea.       No gerd  Genitourinary:  Positive for frequency and urgency. Negative for dysuria.  Musculoskeletal:  Positive for arthralgias (knees). Negative for back pain and neck pain.  Skin:  Negative for rash.  Neurological:  Negative for dizziness, light-headedness, numbness and headaches.  Psychiatric/Behavioral:  Negative for dysphoric mood and sleep disturbance. The patient is not nervous/anxious.        Objective:   Vitals:   01/14/24 1346  BP: 138/78  Pulse: 63  Temp: 98 F (36.7 C)  SpO2: 97%   Filed Weights   01/14/24 1346  Weight: 150 lb (68 kg)   Body mass index is 24.96 kg/m.  BP Readings from Last 3 Encounters:  01/14/24 138/78  01/07/23 136/76  04/16/22 (!) 140/65    Wt Readings from Last 3 Encounters:  01/14/24 150 lb (68 kg)  03/27/23 144 lb (65.3 kg)  01/07/23 144 lb (65.3 kg)       Physical Exam Constitutional: She appears well-developed and well-nourished. No distress.  HENT:  Head: Normocephalic and atraumatic.  Right Ear: External ear normal. Normal ear canal and TM Left Ear: External ear normal.  Normal ear canal and TM Mouth/Throat: Oropharynx is clear and moist.  Eyes: Conjunctivae normal.  Neck: Neck supple. No tracheal deviation present. No thyromegaly  present.  No carotid bruit  Cardiovascular: Normal rate, regular rhythm and normal heart sounds.   No murmur heard.  No edema. Pulmonary/Chest: Effort normal and breath sounds normal. No respiratory distress. She has no wheezes. She has no rales.  Breast: deferred   Abdominal: Soft. She exhibits no distension. There is no tenderness.  Lymphadenopathy: She has no cervical adenopathy.  Skin: Skin is warm and dry. She is not diaphoretic.  Psychiatric: She has a normal mood and affect. Her behavior is normal.     Lab Results  Component Value Date   WBC 5.8 01/07/2023   HGB 13.8 01/07/2023   HCT 41.8 01/07/2023   PLT 225.0 01/07/2023   GLUCOSE 95 01/07/2023   CHOL 206 (H) 01/07/2023   TRIG 65.0 01/07/2023   HDL 75.80 01/07/2023   LDLDIRECT 109.5 09/22/2008   LDLCALC 117 (H) 01/07/2023   ALT 17 01/07/2023   AST 23 01/07/2023   NA 141 01/07/2023   K 3.9 01/07/2023   CL 102 01/07/2023   CREATININE 0.70 01/07/2023   BUN 15 01/07/2023   CO2 29 01/07/2023   TSH 1.80 01/07/2023   HGBA1C 5.2 01/01/2020         Assessment & Plan:   Physical exam: Screening blood work  ordered Exercise  walks regularly Weight  normal  Substance abuse  none   Reviewed recommended immunizations.   Health Maintenance  Topic Date Due   Bone Density Scan  01/11/2024  COVID-19 Vaccine (4 - 2025-26 season) 01/30/2024 (Originally 09/30/2023)   Medicare Annual Wellness (AWV)  03/26/2024   Mammogram  08/27/2024   Colonoscopy  04/16/2027   DTaP/Tdap/Td (4 - Td or Tdap) 02/28/2032   Pneumococcal Vaccine: 50+ Years  Completed   Influenza Vaccine  Completed   Hepatitis C Screening  Completed   Zoster Vaccines- Shingrix  Completed   Meningococcal B Vaccine  Aged Out      Sees her gynecologist every other year.    See Problem List for Assessment and Plan of chronic medical problems.        [1]  Current Outpatient Medications on File Prior to Visit  Medication Sig Dispense Refill    Calcium Carbonate-Vitamin D  600-400 MG-UNIT tablet Take 1 tablet by mouth 2 (two) times daily.     Multiple Vitamins-Minerals (MULTIVITAMIN WITH MINERALS) tablet Take 1 tablet by mouth daily.     Multiple Vitamins-Minerals (PRESERVISION AREDS 2) CAPS      vitamin C (ASCORBIC ACID) 500 MG tablet Take 500 mg by mouth daily.     No current facility-administered medications on file prior to visit.

## 2024-01-12 NOTE — Patient Instructions (Addendum)
 Blood work was ordered.       Medications changes include :   None    A referral was ordered and someone will call you to schedule an appointment.     Return in about 1 year (around 01/13/2025) for Physical Exam, Schedule DEXA-[place].    Health Maintenance, Female Adopting a healthy lifestyle and getting preventive care are important in promoting health and wellness. Ask your health care provider about: The right schedule for you to have regular tests and exams. Things you can do on your own to prevent diseases and keep yourself healthy. What should I know about diet, weight, and exercise? Eat a healthy diet  Eat a diet that includes plenty of vegetables, fruits, low-fat dairy products, and lean protein. Do not eat a lot of foods that are high in solid fats, added sugars, or sodium. Maintain a healthy weight Body mass index (BMI) is used to identify weight problems. It estimates body fat based on height and weight. Your health care provider can help determine your BMI and help you achieve or maintain a healthy weight. Get regular exercise Get regular exercise. This is one of the most important things you can do for your health. Most adults should: Exercise for at least 150 minutes each week. The exercise should increase your heart rate and make you sweat (moderate-intensity exercise). Do strengthening exercises at least twice a week. This is in addition to the moderate-intensity exercise. Spend less time sitting. Even light physical activity can be beneficial. Watch cholesterol and blood lipids Have your blood tested for lipids and cholesterol at 73 years of age, then have this test every 5 years. Have your cholesterol levels checked more often if: Your lipid or cholesterol levels are high. You are older than 73 years of age. You are at high risk for heart disease. What should I know about cancer screening? Depending on your health history and family history, you may  need to have cancer screening at various ages. This may include screening for: Breast cancer. Cervical cancer. Colorectal cancer. Skin cancer. Lung cancer. What should I know about heart disease, diabetes, and high blood pressure? Blood pressure and heart disease High blood pressure causes heart disease and increases the risk of stroke. This is more likely to develop in people who have high blood pressure readings or are overweight. Have your blood pressure checked: Every 3-5 years if you are 31-66 years of age. Every year if you are 53 years old or older. Diabetes Have regular diabetes screenings. This checks your fasting blood sugar level. Have the screening done: Once every three years after age 1 if you are at a normal weight and have a low risk for diabetes. More often and at a younger age if you are overweight or have a high risk for diabetes. What should I know about preventing infection? Hepatitis B If you have a higher risk for hepatitis B, you should be screened for this virus. Talk with your health care provider to find out if you are at risk for hepatitis B infection. Hepatitis C Testing is recommended for: Everyone born from [birth year] through 1965. Anyone with known risk factors for hepatitis C. Sexually transmitted infections (STIs) Get screened for STIs, including gonorrhea and chlamydia, if: You are sexually active and are younger than 73 years of age. You are older than 73 years of age and your health care provider tells you that you are at risk for this type of infection. Your  sexual activity has changed since you were last screened, and you are at increased risk for chlamydia or gonorrhea. Ask your health care provider if you are at risk. Ask your health care provider about whether you are at high risk for HIV. Your health care provider may recommend a prescription medicine to help prevent HIV infection. If you choose to take medicine to prevent HIV, you should first get  tested for HIV. You should then be tested every 3 months for as long as you are taking the medicine. Pregnancy If you are about to stop having your period (premenopausal) and you may become pregnant, seek counseling before you get pregnant. Take 400 to 800 micrograms (mcg) of folic acid every day if you become pregnant. Ask for birth control (contraception) if you want to prevent pregnancy. Osteoporosis and menopause Osteoporosis is a disease in which the bones lose minerals and strength with aging. This can result in bone fractures. If you are 52 years old or older, or if you are at risk for osteoporosis and fractures, ask your health care provider if you should: Be screened for bone loss. Take a calcium or vitamin D  supplement to lower your risk of fractures. Be given hormone replacement therapy (HRT) to treat symptoms of menopause. Follow these instructions at home: Alcohol use Do not drink alcohol if: Your health care provider tells you not to drink. You are pregnant, may be pregnant, or are planning to become pregnant. If you drink alcohol: Limit how much you have to: 0-1 drink a day. Know how much alcohol is in your drink. In the [place] one drink equals one 12 oz bottle of beer (355 mL), one 5 oz glass of wine (148 mL), or one 1 oz glass of hard liquor (44 mL). Lifestyle Do not use any products that contain nicotine or tobacco. These products include cigarettes, chewing tobacco, and vaping devices, such as e-cigarettes. If you need help quitting, ask your health care provider. Do not use street drugs. Do not share needles. Ask your health care provider for help if you need support or information about quitting drugs. General instructions Schedule regular health, dental, and eye exams. Stay current with your vaccines. Tell your health care provider if: You often feel depressed. You have ever been abused or do not feel safe at home. Summary Adopting a healthy lifestyle and getting  preventive care are important in promoting health and wellness. Follow your health care provider's instructions about healthy diet, exercising, and getting tested or screened for diseases. Follow your health care provider's instructions on monitoring your cholesterol and blood pressure. This information is not intended to replace advice given to you by your health care provider. Make sure you discuss any questions you have with your health care provider. Document Revised: 06/06/2020 Document Reviewed: 06/06/2020 Elsevier Patient Education  2024 [employer].

## 2024-01-13 ENCOUNTER — Other Ambulatory Visit

## 2024-01-14 ENCOUNTER — Ambulatory Visit: Admitting: Internal Medicine

## 2024-01-14 VITALS — BP 138/78 | HR 63 | Temp 98.0°F | Ht 65.0 in | Wt 150.0 lb

## 2024-01-14 DIAGNOSIS — M810 Age-related osteoporosis without current pathological fracture: Secondary | ICD-10-CM | POA: Diagnosis not present

## 2024-01-14 DIAGNOSIS — Z0000 Encounter for general adult medical examination without abnormal findings: Secondary | ICD-10-CM

## 2024-01-14 LAB — CBC
HCT: 39.8 % (ref 36.0–46.0)
Hemoglobin: 13.6 g/dL (ref 12.0–15.0)
MCHC: 34.1 g/dL (ref 30.0–36.0)
MCV: 99.3 fl (ref 78.0–100.0)
Platelets: 211 K/uL (ref 150.0–400.0)
RBC: 4 Mil/uL (ref 3.87–5.11)
RDW: 13.7 % (ref 11.5–15.5)
WBC: 5.4 K/uL (ref 4.0–10.5)

## 2024-01-14 LAB — COMPREHENSIVE METABOLIC PANEL WITH GFR
ALT: 15 U/L (ref 3–35)
AST: 21 U/L (ref 5–37)
Albumin: 4.5 g/dL (ref 3.5–5.2)
Alkaline Phosphatase: 61 U/L (ref 39–117)
BUN: 14 mg/dL (ref 6–23)
CO2: 31 meq/L (ref 19–32)
Calcium: 9.7 mg/dL (ref 8.4–10.5)
Chloride: 101 meq/L (ref 96–112)
Creatinine, Ser: 0.68 mg/dL (ref 0.40–1.20)
GFR: 86.17 mL/min (ref >60.00)
Glucose, Bld: 94 mg/dL (ref 70–99)
Potassium: 3.9 meq/L (ref 3.5–5.1)
Sodium: 138 meq/L (ref 135–145)
Total Bilirubin: 0.7 mg/dL (ref 0.2–1.2)
Total Protein: 6.9 g/dL (ref 6.0–8.3)

## 2024-01-14 LAB — LIPID PANEL
Cholesterol: 203 mg/dL — ABNORMAL HIGH (ref 28–200)
HDL: 81.2 mg/dL (ref >39.00)
LDL Cholesterol: 108 mg/dL — ABNORMAL HIGH (ref 10–99)
NonHDL: 122.15
Total CHOL/HDL Ratio: 3
Triglycerides: 71 mg/dL (ref 10.0–149.0)
VLDL: 14.2 mg/dL (ref 0.0–40.0)

## 2024-01-14 LAB — TSH: TSH: 1.76 u[IU]/mL (ref 0.35–5.50)

## 2024-01-14 LAB — VITAMIN D 25 HYDROXY (VIT D DEFICIENCY, FRACTURES): VITD: 54.23 ng/mL (ref 30.00–100.00)

## 2024-01-14 NOTE — Assessment & Plan Note (Signed)
 Chronic S/p actonel, then boniva x 5 years  ( stopped 2012) S/p fosamax  2020 - 2025 Dexa due   - ordered Continue calcium and vitamin d  Continue regular walking Check vitamin d  level, cbc, cmp, tsh

## 2024-01-15 ENCOUNTER — Ambulatory Visit: Payer: Self-pay | Admitting: Internal Medicine

## 2024-01-15 DIAGNOSIS — M810 Age-related osteoporosis without current pathological fracture: Secondary | ICD-10-CM

## 2024-02-10 ENCOUNTER — Ambulatory Visit (INDEPENDENT_AMBULATORY_CARE_PROVIDER_SITE_OTHER)
Admission: RE | Admit: 2024-02-10 | Discharge: 2024-02-10 | Disposition: A | Source: Ambulatory Visit | Attending: Internal Medicine

## 2024-02-10 DIAGNOSIS — M810 Age-related osteoporosis without current pathological fracture: Secondary | ICD-10-CM | POA: Diagnosis not present

## 2024-03-30 ENCOUNTER — Ambulatory Visit

## 2025-01-07 ENCOUNTER — Ambulatory Visit: Payer: PPO

## 2025-01-18 ENCOUNTER — Encounter: Admitting: Internal Medicine
